# Patient Record
Sex: Female | Born: 1990 | Race: White | Hispanic: No | Smoking: Former smoker
Health system: Southern US, Community
[De-identification: ages and names within clinical notes are randomized; demographics above are authoritative.]

## PROBLEM LIST (undated history)

## (undated) ENCOUNTER — Inpatient Hospital Stay (HOSPITAL_COMMUNITY): Payer: Self-pay

## (undated) DIAGNOSIS — K859 Acute pancreatitis without necrosis or infection, unspecified: Secondary | ICD-10-CM

## (undated) DIAGNOSIS — J45909 Unspecified asthma, uncomplicated: Secondary | ICD-10-CM

## (undated) DIAGNOSIS — Z8619 Personal history of other infectious and parasitic diseases: Secondary | ICD-10-CM

## (undated) DIAGNOSIS — K219 Gastro-esophageal reflux disease without esophagitis: Secondary | ICD-10-CM

## (undated) DIAGNOSIS — K649 Unspecified hemorrhoids: Secondary | ICD-10-CM

## (undated) DIAGNOSIS — N390 Urinary tract infection, site not specified: Secondary | ICD-10-CM

## (undated) DIAGNOSIS — L0292 Furuncle, unspecified: Secondary | ICD-10-CM

## (undated) DIAGNOSIS — L0293 Carbuncle, unspecified: Secondary | ICD-10-CM

## (undated) DIAGNOSIS — N83209 Unspecified ovarian cyst, unspecified side: Secondary | ICD-10-CM

## (undated) HISTORY — PX: TONSILLECTOMY: SUR1361

## (undated) HISTORY — DX: Personal history of other infectious and parasitic diseases: Z86.19

## (undated) HISTORY — PX: BACK SURGERY: SHX140

## (undated) HISTORY — PX: WISDOM TOOTH EXTRACTION: SHX21

---
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
## 2008-02-27 IMAGING — CR DG CHEST 1V PORT
1 series · 1 of 1 positions shown · non-contrast
Comparison: none

REASON FOR EXAM: Short of Breath  rm 15
COMMENTS:   LMP: Two weeks ago

PROCEDURE:     DXR - DXR PORTABLE CHEST SINGLE VIEW  - July 18, 2007  [DATE]
RESULT:     The lungs are mildly hyperinflated. There is no focal
infiltrate. The heart is normal in size and the pulmonary vascularity is not
engorged.

[view not recorded]
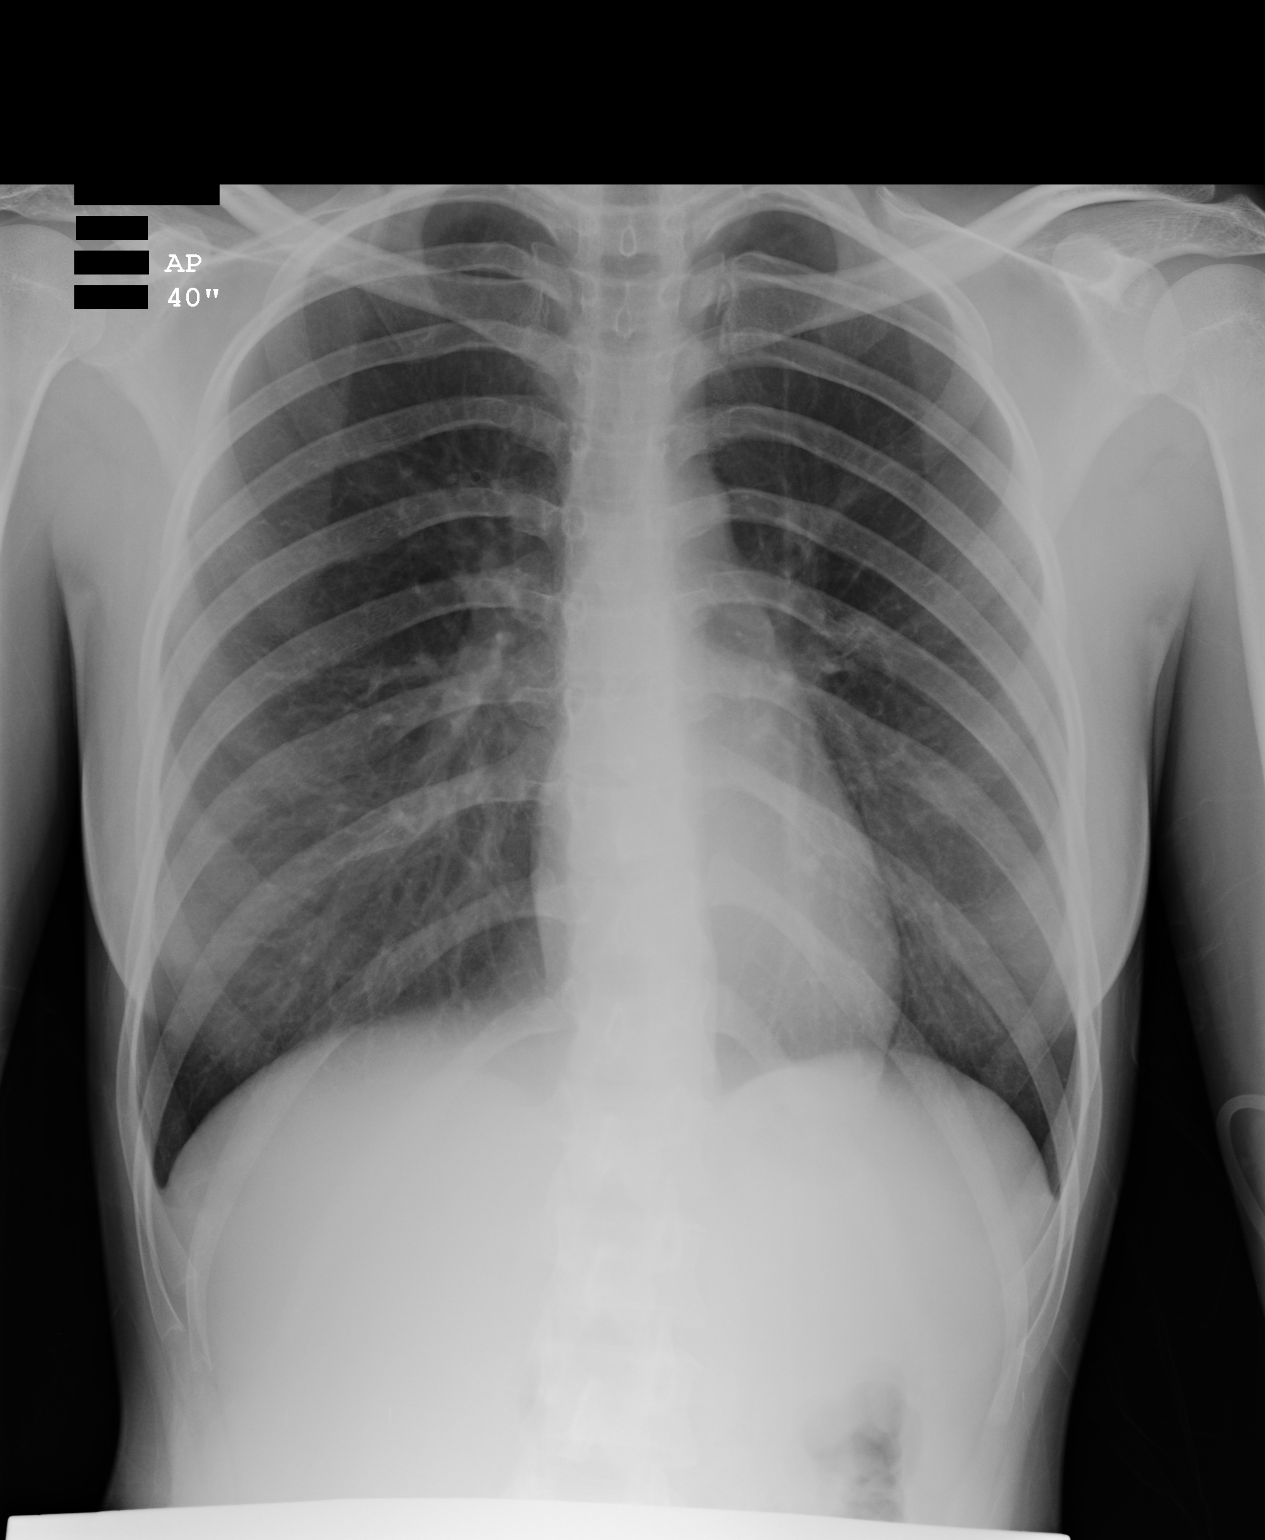

[1 of 1 positions shown; findings below may reference images not displayed]

IMPRESSION: There are findings which likely reflect an element of reactive airway
disease and acute bronchitis. I do not see evidence of pneumonia. Followup
films would be a value.

## 2008-06-03 ENCOUNTER — Emergency Department: Payer: Self-pay | Admitting: Emergency Medicine

## 2009-06-16 ENCOUNTER — Emergency Department: Payer: Self-pay | Admitting: Emergency Medicine

## 2009-10-25 ENCOUNTER — Emergency Department (HOSPITAL_COMMUNITY): Admission: EM | Admit: 2009-10-25 | Discharge: 2009-10-25 | Payer: Self-pay | Admitting: Emergency Medicine

## 2010-01-30 ENCOUNTER — Emergency Department: Payer: Self-pay | Admitting: Emergency Medicine
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
## 2010-10-19 ENCOUNTER — Emergency Department (HOSPITAL_COMMUNITY)
Admission: EM | Admit: 2010-10-19 | Discharge: 2010-10-19 | Disposition: A | Discharge status: Home / Self Care | Payer: Self-pay | Attending: Emergency Medicine | Admitting: Emergency Medicine

## 2010-10-19 DIAGNOSIS — R51 Headache: Secondary | ICD-10-CM | POA: Insufficient documentation

## 2010-10-19 DIAGNOSIS — J45909 Unspecified asthma, uncomplicated: Secondary | ICD-10-CM | POA: Insufficient documentation

## 2010-11-13 LAB — BASIC METABOLIC PANEL
BUN: 12 mg/dL (ref 6–23)
Calcium: 8.8 mg/dL (ref 8.4–10.5)
Creatinine, Ser: 0.64 mg/dL (ref 0.4–1.2)
GFR calc non Af Amer: >60 mL/min (ref >60)
Glucose, Bld: 88 mg/dL (ref 70–99)

## 2010-11-13 LAB — DIFFERENTIAL
Basophils Absolute: 0 10*3/uL (ref 0.0–0.1)
Eosinophils Relative: 1 % (ref 0–5)
Lymphocytes Relative: 17 % (ref 12–46)
Neutro Abs: 7.1 10*3/uL (ref 1.7–7.7)
Neutrophils Relative %: 77 % (ref 43–77)

## 2010-11-13 LAB — CBC
Platelets: 171 10*3/uL (ref 150–400)
RDW: 13.5 % (ref 11.5–15.5)

## 2010-11-13 LAB — POCT I-STAT, CHEM 8
BUN: 13 mg/dL (ref 6–23)
Chloride: 110 mEq/L (ref 96–112)
Potassium: 4.2 mEq/L (ref 3.5–5.1)
Sodium: 139 mEq/L (ref 135–145)
TCO2: 19 mmol/L (ref 0–100)

## 2010-11-13 LAB — POCT PREGNANCY, URINE: Preg Test, Ur: NEGATIVE

## 2011-01-03 NOTE — Op Note (Signed)
Maria [name], Maria [name]                ACCOUNT NO.:  [id number]   MEDICAL RECORD NO.:  [id number]          PATIENT TYPE:  AMB   LOCATION:  [facility]                          FACILITY:  [facility]   PHYSICIAN:  Lucky Cowboy, MD         DATE OF BIRTH:  Jan 31, 1991   DATE OF PROCEDURE:  08/20/2007  DATE OF DISCHARGE:  08/21/2007                               OPERATIVE REPORT   PREOPERATIVE DIAGNOSIS:  Chronic tonsillitis with adenotonsillar  hypertrophy.   POSTOPERATIVE DIAGNOSIS:  Chronic tonsillitis with adenotonsillar  hypertrophy.   PROCEDURE:  Adenotonsillectomy.   SURGEON:  Dr. Lucky Cowboy.   ANESTHESIA:  General endotracheal anesthesia.   ESTIMATED BLOOD LOSS:  Less than 20 mL.   SPECIMENS:  Tonsils and adenoids.   COMPLICATIONS:  None.   INDICATIONS:  The patient is an 20 year old female who has had problems  with chronic sore throat recurrent infections.  For these reasons,  adenotonsillectomy is performed.   FINDINGS:  The patient was noted to have scarred bilateral palatine  tonsils and a moderate amount of adenoid hypertrophy.   PROCEDURE:  The patient was taken to the operating room and placed on  the table in the supine position.  She was then placed under general  endotracheal anesthesia and the table rotated counterclockwise 90  degrees.  The head and body were draped.  A Crowe-Davis mouth gag with a  #3 tongue blade was then placed intraorally, opened and suspended on the  Mayo stand.  Palpation of the soft palate was without evidence of  submucosal cleft.  A red rubber catheter was placed down the left  nostril, brought out through the oral cavity and secured in place with a  hemostat.  A large adenoid curette was placed against vomer, directed  inferiorly with subsequent passes severing the adenoid pad.  Two sterile  gauze Afrin soaked packs were placed in the nasopharynx and time allowed  for hemostasis.  The palate was then relaxed and the right palatine  tonsil grasped  with Allis clamps and directed inferior medially.  Bovie  cautery was used to excise the tonsil staying within the peritonsillar  space adjacent to the tonsillar capsule.  The left palatine tonsil was  removed in identical fashion.  The palate was then re-elevated and packs  removed.  Suction cautery used for hemostasis.  Nasopharynx was  copiously irrigated transnasally with normal saline which was suctioned  out through the oral cavity.  An NG tube was then placed down the  esophagus for suctioning of the gastric  contents.  The mouth gag was removed noting no damage to the teeth or  soft tissues.  The table was rotated clockwise 90 degrees to its  original position.  The patient was awakened from anesthesia and taken  to the Post Anesthesia Care in stable condition.  There were no  complications.      Lucky Cowboy, MD  Electronically Signed     SJ/MEDQ  D:  10/02/2007  T:  10/04/2007  Job:  [id number]

## 2011-04-14 ENCOUNTER — Emergency Department: Payer: Self-pay | Admitting: *Deleted

## 2011-05-26 LAB — POCT HEMOGLOBIN-HEMACUE: Operator id: 128471

## 2011-11-24 IMAGING — CR LEFT GREAT TOE
1 series · 3 of 3 positions shown · non-contrast
Comparison: none

REASON FOR EXAM: trauma, swelling pain
COMMENTS:

[Series 1: view not recorded · 0.17mm/px · 3 of 3 slices shown]
[im 1/3]
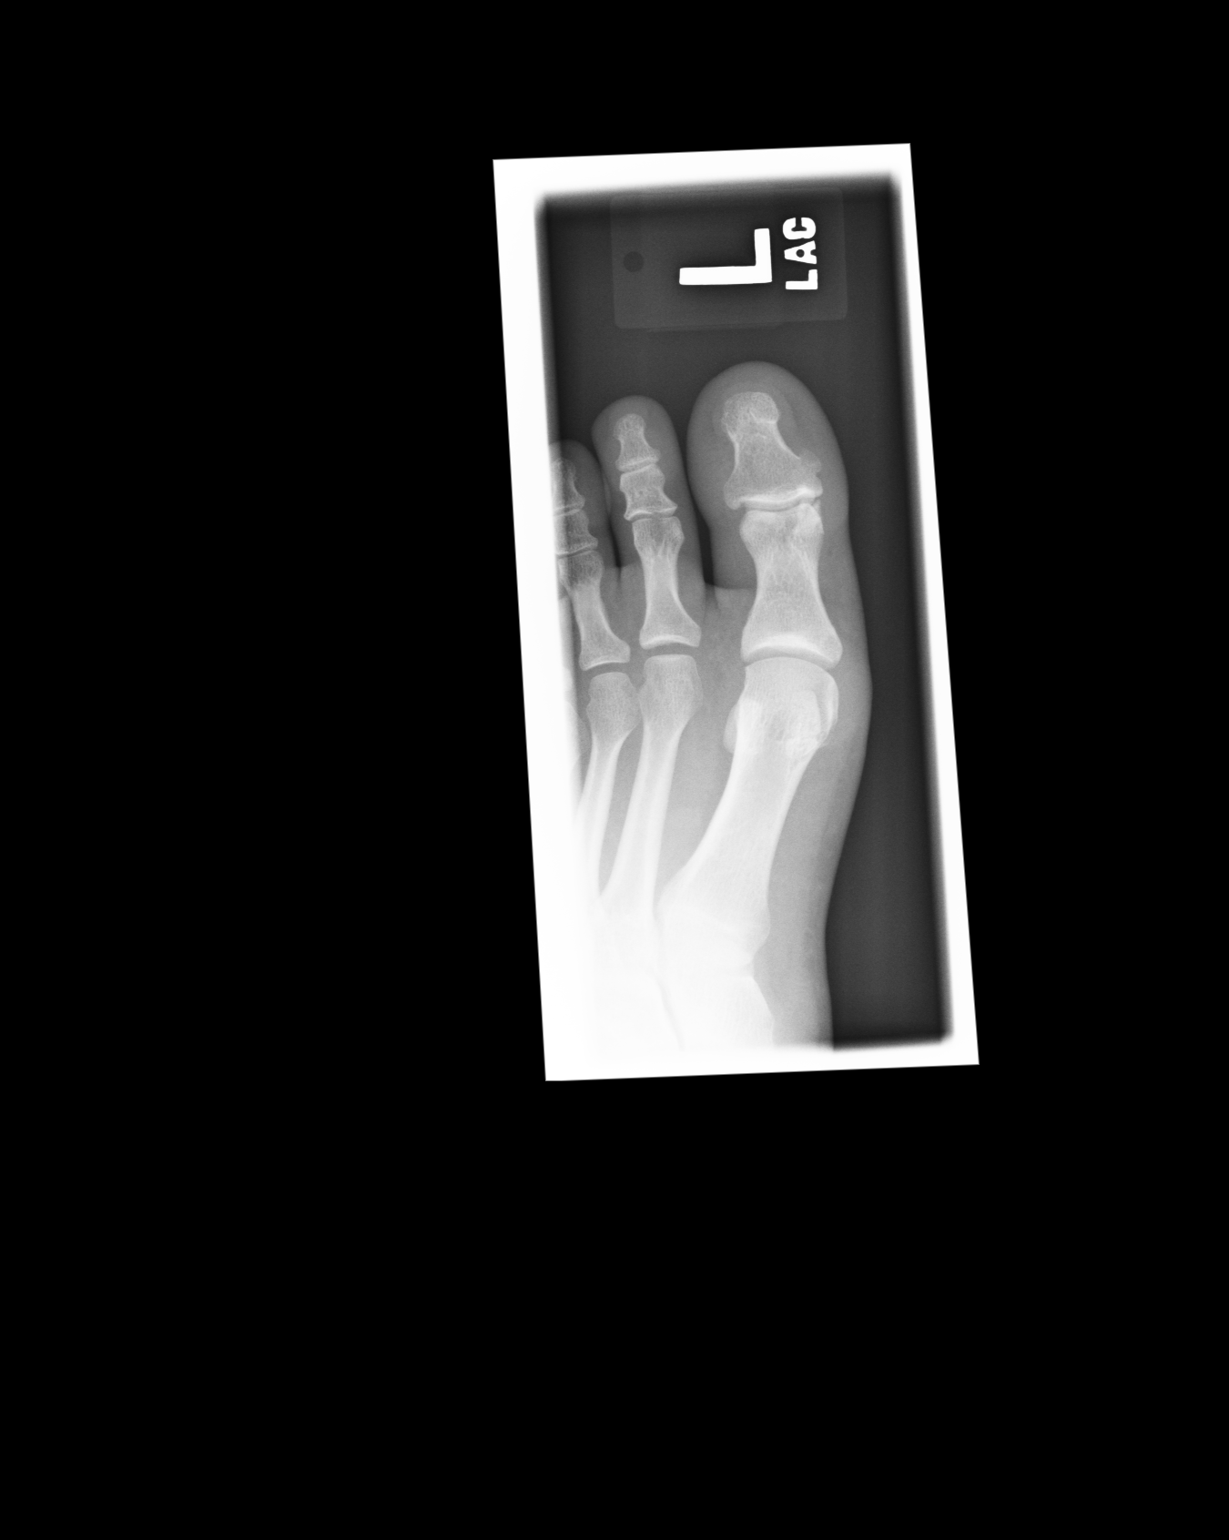
[im 2/3]
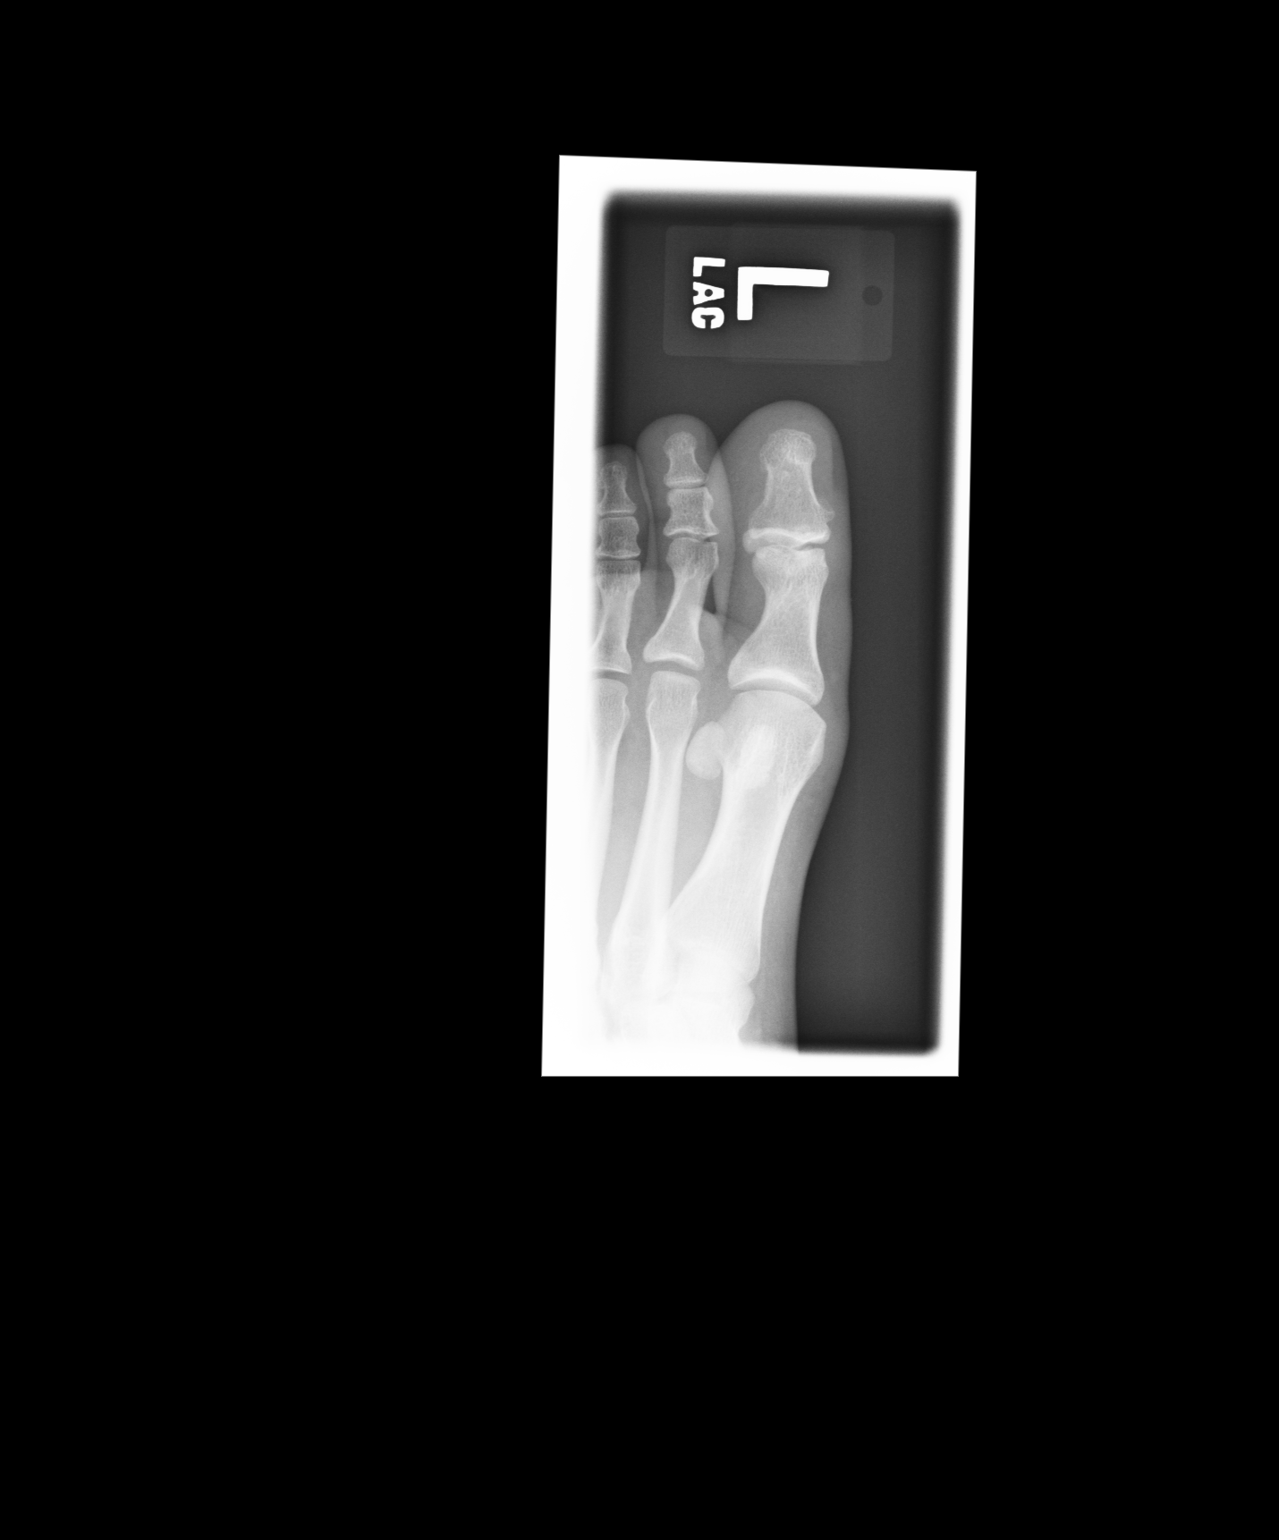
[im 3/3]
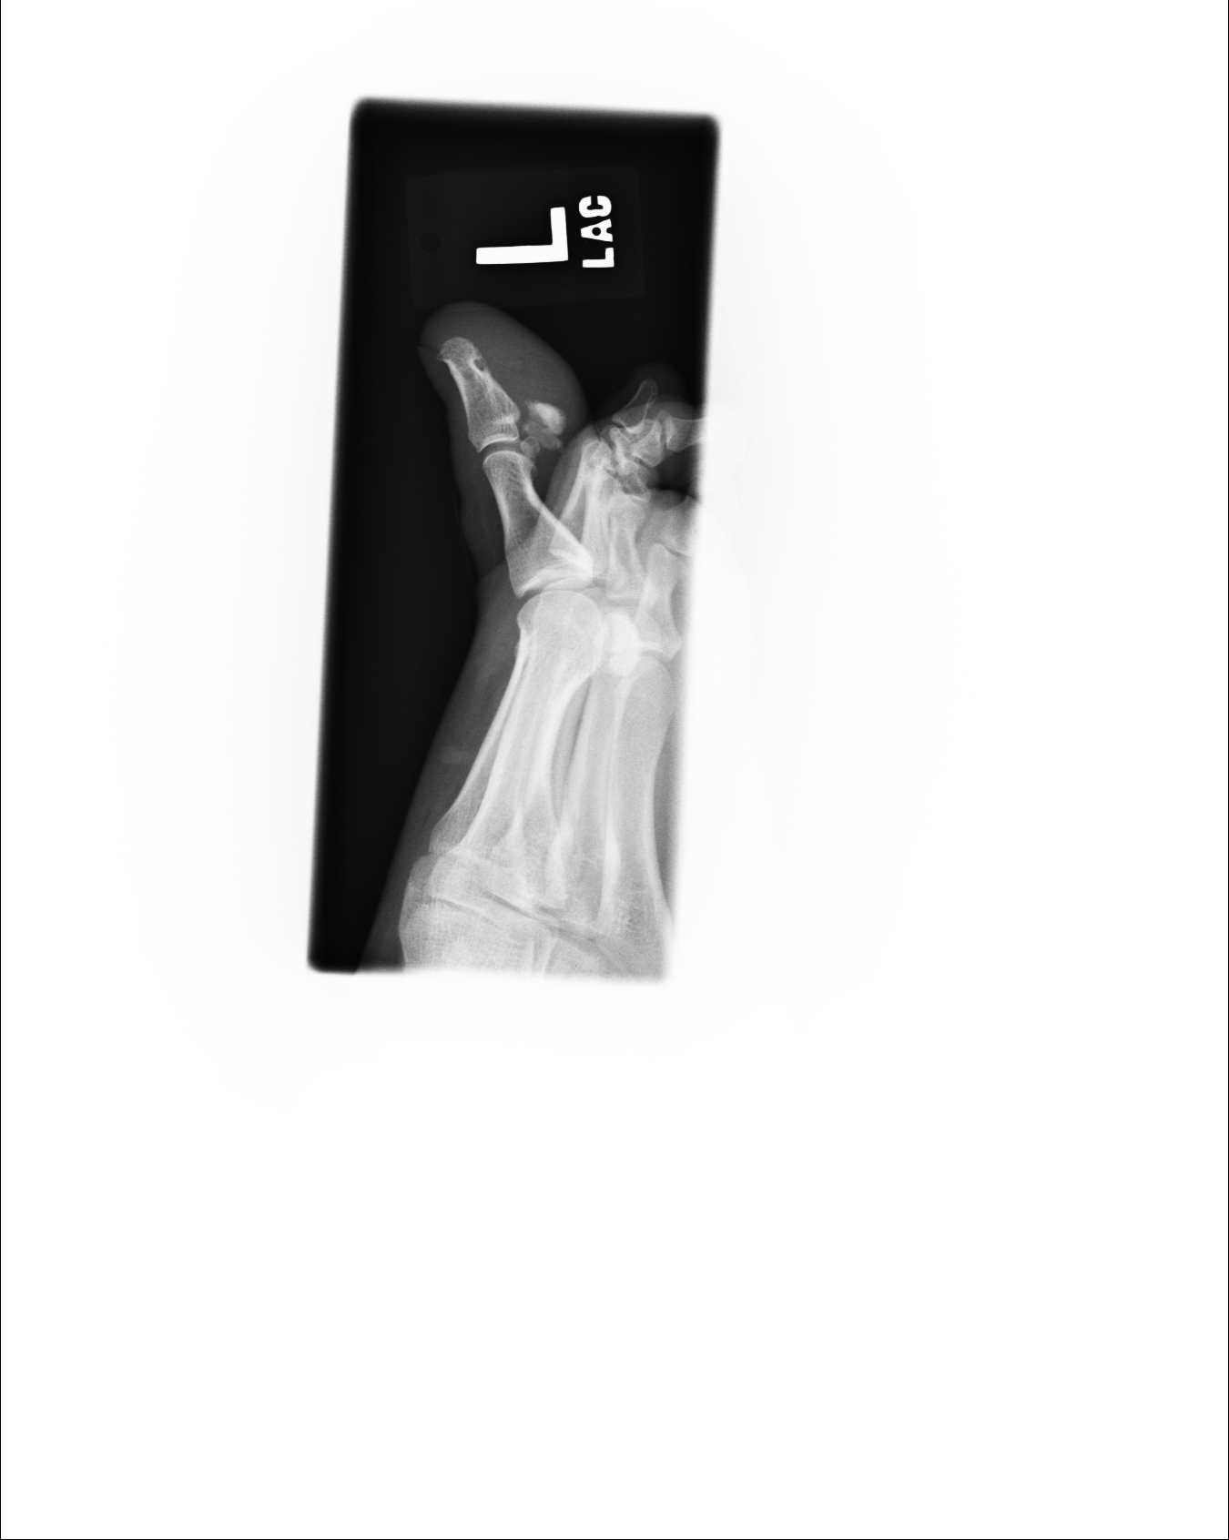

[3 of 3 positions shown; findings below may reference images not displayed]

PROCEDURE:     DXR - DXR TOE GREAT (1ST DIGIT) LT SPILIAUSKAS  - April 14, 2011  [DATE]

RESULT:     No acute fracture or dislocation is seen. There are multiple
osseous densities volar to the IP joint compatible with sesamoid bones.
There is an apparent old fracture of one of the sesamoid bones. No
periosteal reactive changes are seen. No arthritic changes are identified.
IMPRESSION: 1.  No acute bony abnormalities are identified.
2.  Possible old fracture of the more medial sesamoid bone at the IP joint.

## 2012-09-08 ENCOUNTER — Encounter (HOSPITAL_BASED_OUTPATIENT_CLINIC_OR_DEPARTMENT_OTHER): Payer: Self-pay | Admitting: *Deleted

## 2012-09-08 ENCOUNTER — Emergency Department (HOSPITAL_BASED_OUTPATIENT_CLINIC_OR_DEPARTMENT_OTHER)
Admission: EM | Admit: 2012-09-08 | Discharge: 2012-09-08 | Disposition: A | Discharge status: Home / Self Care | Payer: Self-pay | Attending: Emergency Medicine | Admitting: Emergency Medicine

## 2012-09-08 DIAGNOSIS — Z3202 Encounter for pregnancy test, result negative: Secondary | ICD-10-CM | POA: Insufficient documentation

## 2012-09-08 DIAGNOSIS — N390 Urinary tract infection, site not specified: Secondary | ICD-10-CM | POA: Insufficient documentation

## 2012-09-08 DIAGNOSIS — J45909 Unspecified asthma, uncomplicated: Secondary | ICD-10-CM | POA: Insufficient documentation

## 2012-09-08 DIAGNOSIS — F17200 Nicotine dependence, unspecified, uncomplicated: Secondary | ICD-10-CM | POA: Insufficient documentation

## 2012-09-08 HISTORY — DX: Unspecified asthma, uncomplicated: J45.909

## 2012-09-08 LAB — URINALYSIS, ROUTINE W REFLEX MICROSCOPIC
Glucose, UA: NEGATIVE mg/dL
Ketones, ur: NEGATIVE mg/dL
Protein, ur: NEGATIVE mg/dL

## 2012-09-08 LAB — URINE MICROSCOPIC-ADD ON

## 2012-09-08 MED ORDER — SULFAMETHOXAZOLE-TRIMETHOPRIM 800-160 MG PO TABS: 1.0000 | ORAL_TABLET | Freq: Two times a day (BID) | ORAL | Status: AC

## 2012-09-08 NOTE — ED Provider Notes (Signed)
History     CSN: [id number]  Arrival date & time 09/08/12  4098   First MD Initiated Contact with Patient 09/08/12 1939      Chief Complaint  Patient presents with  . Abdominal Pain  . Dysuria    (Consider location/radiation/quality/duration/timing/severity/associated sxs/prior treatment) HPI Comments: Patient with history of UTIs presents with 3 day history of dysuria.  She reports urinary urgency and frequency. She denies hematuria.  No fevers or flank pain.  She reports suprapubic abdominal pain. She has tried taking OTC Cystex and has been drinking cranberry juice.  Her LMP was about 3 weeks ago and normal in duration.  She normally has a period every other week but is not bleeding at this time.  She denies N/V/D.  She has taken 3 pregnancy tests, all of which were negative. Onset was gradual with no relief of symptoms.   Patient is a 22 y.o. female presenting with abdominal pain and dysuria. The history is provided by the patient.  Abdominal Pain The primary symptoms of the illness include abdominal pain and dysuria. The primary symptoms of the illness do not include fever, fatigue, shortness of breath, nausea, vomiting, diarrhea, vaginal discharge or vaginal bleeding.  The dysuria is associated with urgency. The dysuria is not associated with hematuria.  Additional symptoms associated with the illness include urgency. Symptoms associated with the illness do not include chills, constipation or hematuria.  Dysuria  Associated symptoms include urgency. Pertinent negatives include no chills, no nausea, no vomiting, no hematuria and no flank pain.    Past Medical History  Diagnosis Date  . Asthma     Past Surgical History  Procedure Date  . Tonsillectomy   . Wisdom tooth extraction     No family history on file.  History  Substance Use Topics  . Smoking status: Current Some Day Smoker    Types: Cigarettes  . Smokeless tobacco: Never Used  . Alcohol Use: 0.6 oz/week    1  Cans of beer per week    OB History    Grav Para Term Preterm Abortions TAB SAB Ect Mult Living                  Review of Systems  Constitutional: Negative for fever, chills, activity change, appetite change and fatigue.  HENT: Negative for sore throat and rhinorrhea.   Eyes: Negative for redness.  Respiratory: Negative for cough and shortness of breath.   Cardiovascular: Negative for chest pain.  Gastrointestinal: Positive for abdominal pain. Negative for nausea, vomiting, diarrhea and constipation.  Genitourinary: Positive for dysuria, urgency, difficulty urinating and menstrual problem (irregular bleeding). Negative for hematuria, flank pain, vaginal bleeding, vaginal discharge and pelvic pain.  Musculoskeletal: Negative for myalgias.  Skin: Negative for rash.  Neurological: Negative for dizziness, light-headedness and headaches.    Allergies  Penicillins  Home Medications   Current Outpatient Rx  Name  Route  Sig  Dispense  Refill  . ALBUTEROL SULFATE HFA 108 (90 BASE) MCG/ACT IN AERS   Inhalation   Inhale 2 puffs into the lungs every 6 (six) hours as needed.           BP 131/83  Pulse 73  Temp 98.3 F (36.8 C) (Oral)  Resp 18  Ht 5\' 2"  (1.575 m)  Wt 116 lb (52.617 kg)  BMI 21.22 kg/m2  SpO2 98%  LMP 08/21/2012  Physical Exam  Nursing note and vitals reviewed. Constitutional: She appears well-developed and well-nourished.  HENT:  Head: Normocephalic  and atraumatic.  Nose: No mucosal edema or rhinorrhea.       Some crusting in nares, no bleeding.   Eyes: Conjunctivae normal are normal. Right eye exhibits no discharge. Left eye exhibits no discharge.  Neck: Normal range of motion. Neck supple.  Cardiovascular: Normal rate, regular rhythm and normal heart sounds.   Pulmonary/Chest: Effort normal and breath sounds normal.  Abdominal: Soft. Bowel sounds are normal. She exhibits no distension and no mass. There is tenderness in the suprapubic area. There is  no rebound.         Suprapubic tenderness without rebound tenderness.   Neurological: She is alert.  Skin: Skin is warm and dry.  Psychiatric: She has a normal mood and affect.    ED Course  Procedures (including critical care time)  Labs Reviewed  URINALYSIS, ROUTINE W REFLEX MICROSCOPIC - Abnormal; Notable for the following:    APPearance CLOUDY (*)     Specific Gravity, Urine 1.031 (*)     Leukocytes, UA TRACE (*)     All other components within normal limits  URINE MICROSCOPIC-ADD ON - Abnormal; Notable for the following:    Squamous Epithelial / LPF MANY (*)     Bacteria, UA FEW (*)     All other components within normal limits  PREGNANCY, URINE  URINE CULTURE   No results found.   1. Urinary tract infection     Patient seen and examined.   Vital signs reviewed and are as follows: Filed Vitals:   09/08/12 1857  BP: 131/83  Pulse: 73  Temp: 98.3 F (36.8 C)  Resp: 18   The patient was urged to return to the Emergency Department immediately with worsening of current symptoms, worsening abdominal pain, persistent vomiting, blood noted in stools, fever, or any other concerns. The patient verbalized understanding.   Urged GYN f/u for evaluation of irregular periods.   Urged ENT/PCP f/u for evaluation of crusting of nose. Told to use oil based ointment to moisturize.     MDM  Suprapubic pain/dysuria: UA suggest UTI. Culture sent. Bactrim.  Regular uterine bleeding: No current bleeding. Patient follow up with GYN. Do not suspect significant anemia. Urine pregnancy is negative.        Renne Crigler, [place] 09/08/12 2050

## 2012-09-08 NOTE — ED Notes (Signed)
Pt reports abd pain and bloating for a while- had depo 1.5 years ago and period has been irregular- has had 3 neg home preg tests- c/o dysuria for several days- also breast pain. Pt reports she also has sores inside nose-

## 2012-09-09 LAB — URINE CULTURE: Colony Count: 6000

## 2012-09-09 NOTE — ED Provider Notes (Signed)
Medical screening examination/treatment/procedure(s) were performed by non-physician practitioner and as supervising physician I was immediately available for consultation/collaboration.  Trinton Prewitt, MD 09/09/12 0944 

## 2012-11-19 ENCOUNTER — Encounter (HOSPITAL_BASED_OUTPATIENT_CLINIC_OR_DEPARTMENT_OTHER): Payer: Self-pay | Admitting: *Deleted

## 2012-11-19 ENCOUNTER — Emergency Department (HOSPITAL_BASED_OUTPATIENT_CLINIC_OR_DEPARTMENT_OTHER)
Admission: EM | Admit: 2012-11-19 | Discharge: 2012-11-20 | Disposition: A | Discharge status: Home / Self Care | Payer: Self-pay | Attending: Emergency Medicine | Admitting: Emergency Medicine

## 2012-11-19 DIAGNOSIS — N760 Acute vaginitis: Secondary | ICD-10-CM | POA: Insufficient documentation

## 2012-11-19 DIAGNOSIS — F17200 Nicotine dependence, unspecified, uncomplicated: Secondary | ICD-10-CM | POA: Insufficient documentation

## 2012-11-19 DIAGNOSIS — R109 Unspecified abdominal pain: Secondary | ICD-10-CM | POA: Insufficient documentation

## 2012-11-19 DIAGNOSIS — R300 Dysuria: Secondary | ICD-10-CM | POA: Insufficient documentation

## 2012-11-19 DIAGNOSIS — Z3202 Encounter for pregnancy test, result negative: Secondary | ICD-10-CM | POA: Insufficient documentation

## 2012-11-19 DIAGNOSIS — Z79899 Other long term (current) drug therapy: Secondary | ICD-10-CM | POA: Insufficient documentation

## 2012-11-19 DIAGNOSIS — J45909 Unspecified asthma, uncomplicated: Secondary | ICD-10-CM | POA: Insufficient documentation

## 2012-11-19 LAB — URINALYSIS, ROUTINE W REFLEX MICROSCOPIC
Bilirubin Urine: NEGATIVE
Glucose, UA: NEGATIVE mg/dL
Hgb urine dipstick: NEGATIVE
Ketones, ur: NEGATIVE mg/dL
Protein, ur: NEGATIVE mg/dL

## 2012-11-19 LAB — URINE MICROSCOPIC-ADD ON

## 2012-11-19 NOTE — ED Notes (Addendum)
Vaginal itching x 2 days. Was seen here about a month ago and told she had a UTI but she never got the Bactrim Rx filled until 2 days ago. Now her feet are itching. Dysuria.

## 2012-11-20 LAB — WET PREP, GENITAL
Trich, Wet Prep: NONE SEEN
Yeast Wet Prep HPF POC: NONE SEEN

## 2012-11-20 LAB — GC/CHLAMYDIA PROBE AMP
CT Probe RNA: NEGATIVE
GC Probe RNA: NEGATIVE

## 2012-11-20 MED ORDER — METRONIDAZOLE 500 MG PO TABS
2000.0000 mg | ORAL_TABLET | Freq: Once | ORAL | Status: AC
  Administered 2012-11-20: 2000 mg via ORAL
  Filled 2012-11-20: qty 4

## 2012-11-20 NOTE — ED Provider Notes (Signed)
History     CSN: [id number]  Arrival date & time 11/19/12  2215   First MD Initiated Contact with Patient 11/19/12 2356      Chief Complaint  Patient presents with  . Vaginal Pain    (Consider location/radiation/quality/duration/timing/severity/associated sxs/prior treatment) HPI Patient is g0 lmp 2-3 weeks ago, normal period at normal time, began having suprapubic burning and dysuria yesterday and started bactrim.  No fever, some vaginal discharge since beginning bactrim- has taken four doses.  Denies change in uti symptoms.  Patient is taking po well.  Patient has pmd at  [facility] in [place].  Bactrim obtained last visit and didn't take because symptoms resolved without it.  History of utis with similar symptoms but burning worse now.   Past Medical History  Diagnosis Date  . Asthma     Past Surgical History  Procedure Laterality Date  . Tonsillectomy    . Wisdom tooth extraction      No family history on file.  History  Substance Use Topics  . Smoking status: Current Some Day Smoker    Types: Cigarettes  . Smokeless tobacco: Never Used  . Alcohol Use: 0.6 oz/week    1 Cans of beer per week    OB History   Grav Para Term Preterm Abortions TAB SAB Ect Mult Living                  Review of Systems  All other systems reviewed and are negative.    Allergies  Penicillins  Home Medications   Current Outpatient Rx  Name  Route  Sig  Dispense  Refill  . albuterol (PROVENTIL HFA;VENTOLIN HFA) 108 (90 BASE) MCG/ACT inhaler   Inhalation   Inhale 2 puffs into the lungs every 6 (six) hours as needed.           BP 131/86  Pulse 108  Temp(Src) 98.3 F (36.8 C) (Oral)  Resp 20  Wt 116 lb (52.617 kg)  BMI 21.21 kg/m2  SpO2 98%  LMP 10/19/2012  Physical Exam  Vitals reviewed. Constitutional: She appears well-developed and well-nourished.  HENT:  Head: Normocephalic and atraumatic.  Right Ear: External ear normal.  Left Ear: External ear normal.  Eyes:  Conjunctivae and EOM are normal. Pupils are equal, round, and reactive to light.  Neck: Normal range of motion. Neck supple.  Cardiovascular: Normal rate and regular rhythm.   Pulmonary/Chest: Effort normal and breath sounds normal.  Abdominal: Soft. Bowel sounds are normal.  Genitourinary: No erythema, tenderness or bleeding around the vagina. No vaginal discharge found.    ED Course  Procedures (including critical care time)  Labs Reviewed  URINALYSIS, ROUTINE W REFLEX MICROSCOPIC - Abnormal; Notable for the following:    APPearance CLOUDY (*)    Leukocytes, UA TRACE (*)    All other components within normal limits  URINE MICROSCOPIC-ADD ON - Abnormal; Notable for the following:    Squamous Epithelial / LPF FEW (*)    All other components within normal limits  PREGNANCY, URINE   No results found.   No diagnosis found.    MDM  Patient with possible UTI has has been partially treated with Bactrim she is instructed to continue this. Wet prep is consistent for bacterial vaginosis and patient given metronidazole 2 g here in the emergency department.       Hilario Quarry, MD 11/20/12 [phone]

## 2013-06-05 ENCOUNTER — Encounter (HOSPITAL_BASED_OUTPATIENT_CLINIC_OR_DEPARTMENT_OTHER): Payer: Self-pay | Admitting: Emergency Medicine

## 2013-06-05 ENCOUNTER — Emergency Department (HOSPITAL_BASED_OUTPATIENT_CLINIC_OR_DEPARTMENT_OTHER): Payer: Self-pay

## 2013-06-05 ENCOUNTER — Emergency Department (HOSPITAL_BASED_OUTPATIENT_CLINIC_OR_DEPARTMENT_OTHER)
Admission: EM | Admit: 2013-06-05 | Discharge: 2013-06-05 | Disposition: A | Discharge status: Home / Self Care | Payer: Self-pay | Attending: Emergency Medicine | Admitting: Emergency Medicine

## 2013-06-05 DIAGNOSIS — Z880 Allergy status to penicillin: Secondary | ICD-10-CM | POA: Insufficient documentation

## 2013-06-05 DIAGNOSIS — Z3202 Encounter for pregnancy test, result negative: Secondary | ICD-10-CM | POA: Insufficient documentation

## 2013-06-05 DIAGNOSIS — R110 Nausea: Secondary | ICD-10-CM | POA: Insufficient documentation

## 2013-06-05 DIAGNOSIS — F17200 Nicotine dependence, unspecified, uncomplicated: Secondary | ICD-10-CM | POA: Insufficient documentation

## 2013-06-05 DIAGNOSIS — K859 Acute pancreatitis without necrosis or infection, unspecified: Secondary | ICD-10-CM | POA: Insufficient documentation

## 2013-06-05 DIAGNOSIS — J45909 Unspecified asthma, uncomplicated: Secondary | ICD-10-CM | POA: Insufficient documentation

## 2013-06-05 DIAGNOSIS — Z79899 Other long term (current) drug therapy: Secondary | ICD-10-CM | POA: Insufficient documentation

## 2013-06-05 DIAGNOSIS — R1011 Right upper quadrant pain: Secondary | ICD-10-CM | POA: Insufficient documentation

## 2013-06-05 LAB — PREGNANCY, URINE: Preg Test, Ur: NEGATIVE

## 2013-06-05 LAB — CBC WITH DIFFERENTIAL/PLATELET
Basophils Relative: 0 % (ref 0–1)
HCT: 42.6 % (ref 36.0–46.0)
Hemoglobin: 14.4 g/dL (ref 12.0–15.0)
Lymphs Abs: 2.4 10*3/uL (ref 0.7–4.0)
MCH: 32 pg (ref 26.0–34.0)
MCHC: 33.8 g/dL (ref 30.0–36.0)
Monocytes Absolute: 0.5 10*3/uL (ref 0.1–1.0)
Monocytes Relative: 8 % (ref 3–12)
Neutro Abs: 2.8 10*3/uL (ref 1.7–7.7)

## 2013-06-05 LAB — COMPREHENSIVE METABOLIC PANEL
Alkaline Phosphatase: 60 U/L (ref 39–117)
BUN: 15 mg/dL (ref 6–23)
CO2: 28 mEq/L (ref 19–32)
GFR calc Af Amer: >90 mL/min (ref >90)
GFR calc non Af Amer: >90 mL/min (ref >90)
Glucose, Bld: 84 mg/dL (ref 70–99)
Potassium: 4.3 mEq/L (ref 3.5–5.1)
Total Bilirubin: 0.2 mg/dL — ABNORMAL LOW (ref 0.3–1.2)
Total Protein: 7.6 g/dL (ref 6.0–8.3)

## 2013-06-05 LAB — URINALYSIS, ROUTINE W REFLEX MICROSCOPIC
Glucose, UA: NEGATIVE mg/dL
Hgb urine dipstick: NEGATIVE
Ketones, ur: NEGATIVE mg/dL
Protein, ur: NEGATIVE mg/dL

## 2013-06-05 MED ORDER — ONDANSETRON HCL 4 MG PO TABS: 4.0000 mg | ORAL_TABLET | Freq: Four times a day (QID) | ORAL | Status: DC

## 2013-06-05 MED ORDER — HYDROMORPHONE HCL PF 1 MG/ML IJ SOLN
1.0000 mg | Freq: Once | INTRAMUSCULAR | Status: DC
  Filled 2013-06-05: qty 1

## 2013-06-05 MED ORDER — SODIUM CHLORIDE 0.9 % IV BOLUS (SEPSIS): 1000.0000 mL | Freq: Once | INTRAVENOUS | Status: DC

## 2013-06-05 MED ORDER — MORPHINE SULFATE 4 MG/ML IJ SOLN
4.0000 mg | Freq: Once | INTRAMUSCULAR | Status: AC
  Administered 2013-06-05: 4 mg via INTRAVENOUS
  Filled 2013-06-05: qty 1

## 2013-06-05 MED ORDER — SODIUM CHLORIDE 0.9 % IV BOLUS (SEPSIS)
1000.0000 mL | Freq: Once | INTRAVENOUS | Status: AC
  Administered 2013-06-05: 1000 mL via INTRAVENOUS

## 2013-06-05 MED ORDER — ONDANSETRON HCL 4 MG/2ML IJ SOLN
4.0000 mg | INTRAMUSCULAR | Status: AC
  Administered 2013-06-05: 4 mg via INTRAVENOUS
  Filled 2013-06-05: qty 2

## 2013-06-05 MED ORDER — OXYCODONE-ACETAMINOPHEN 5-325 MG PO TABS: 1.0000 | ORAL_TABLET | Freq: Four times a day (QID) | ORAL | Status: DC | PRN

## 2013-06-05 NOTE — ED Notes (Signed)
Patient states she has had abdominal pain for the last two days.  States she thinks may be pregnant.  States the pain is radiating into her back and her chest.

## 2013-06-05 NOTE — ED Provider Notes (Signed)
CSN: [id number]     Arrival date & time 06/05/13  1429 History   First MD Initiated Contact with Patient 06/05/13 1439     Chief Complaint  Patient presents with  . Abdominal Pain   (Consider location/radiation/quality/duration/timing/severity/associated sxs/prior Treatment) HPI Comments: Patient is a 22 year old female with no significant past medical history who presents for abdominal pain x2 days. Patient states the pain is aching, throbbing, and sharp and radiates to her right upper quadrant and right back as well as up to her chest. Patient denies any modifying factors of her pain and has not taken anything for pain control prior to arrival. She admits to associated nausea and denies associated fever, shortness of breath, vomiting or diarrhea, melena or hematochezia, urinary symptoms, vaginal bleeding or discharge, and numbness or tingling. Patient denies a history of abdominal surgeries. She states her last bowel movement was yesterday which was normal in color and consistency.  Patient is a 22 y.o. female presenting with abdominal pain. The history is provided by the patient. No language interpreter was used.  Abdominal Pain Associated symptoms: nausea   Associated symptoms: no chest pain, no diarrhea, no dysuria, no fever, no hematuria, no shortness of breath, no vaginal bleeding, no vaginal discharge and no vomiting     Past Medical History  Diagnosis Date  . Asthma    Past Surgical History  Procedure Laterality Date  . Tonsillectomy    . Wisdom tooth extraction     No family history on file. History  Substance Use Topics  . Smoking status: Current Some Day Smoker    Types: Cigarettes  . Smokeless tobacco: Never Used  . Alcohol Use: 0.6 oz/week    1 Cans of beer per week   OB History   Grav Para Term Preterm Abortions TAB SAB Ect Mult Living                 Review of Systems  Constitutional: Negative for fever.  Respiratory: Negative for shortness of breath.     Cardiovascular: Negative for chest pain.  Gastrointestinal: Positive for nausea and abdominal pain. Negative for vomiting, diarrhea and blood in stool.  Genitourinary: Negative for dysuria, hematuria, vaginal bleeding and vaginal discharge.  All other systems reviewed and are negative.    Allergies  Penicillins  Home Medications   Current Outpatient Rx  Name  Route  Sig  Dispense  Refill  . albuterol (PROVENTIL HFA;VENTOLIN HFA) 108 (90 BASE) MCG/ACT inhaler   Inhalation   Inhale 2 puffs into the lungs every 6 (six) hours as needed.         . ondansetron (ZOFRAN) 4 MG tablet   Oral   Take 1 tablet (4 mg total) by mouth every 6 (six) hours.   12 tablet   0   . oxyCODONE-acetaminophen (PERCOCET/ROXICET) 5-325 MG per tablet   Oral   Take 1-2 tablets by mouth every 6 (six) hours as needed for pain.   27 tablet   0    BP 129/84  Temp(Src) 98.7 F (37.1 C) (Oral)  Resp 20  Ht 5\' 2"  (1.575 m)  Wt 122 lb (55.339 kg)  BMI 22.31 kg/m2  SpO2 100%  LMP 05/20/2013  Physical Exam  Nursing note and vitals reviewed. Constitutional: She is oriented to person, place, and time. She appears well-developed and well-nourished. No distress.  HENT:  Head: Normocephalic and atraumatic.  Eyes: Conjunctivae and EOM are normal. Pupils are equal, round, and reactive to light. No scleral icterus.  Neck: Normal range of motion.  Cardiovascular: Normal rate, regular rhythm, normal heart sounds and intact distal pulses.   Pulmonary/Chest: Effort normal. No respiratory distress. She has no wheezes. She has no rales.  Abdominal: Soft. She exhibits no mass. There is tenderness. There is no rebound and no guarding.    Tenderness to palpation in the epigastric and right upper quadrant. No peritoneal signs or guarding appreciated.  Musculoskeletal: Normal range of motion.  Neurological: She is alert and oriented to person, place, and time.  Skin: Skin is warm and dry. No rash noted. She is not  diaphoretic. No erythema. No pallor.  Psychiatric: She has a normal mood and affect. Her behavior is normal.    ED Course  Procedures (including critical care time) Labs Review Labs Reviewed  LIPASE, BLOOD - Abnormal; Notable for the following:    Lipase 241 (*)    All other components within normal limits  COMPREHENSIVE METABOLIC PANEL - Abnormal; Notable for the following:    Total Bilirubin 0.2 (*)    All other components within normal limits  URINALYSIS, ROUTINE W REFLEX MICROSCOPIC  PREGNANCY, URINE  CBC WITH DIFFERENTIAL   Imaging Review US Abdomen Complete  06/05/2013   CLINICAL DATA:  Abdominal pain  EXAM: ULTRASOUND ABDOMEN COMPLETE  COMPARISON:  None.  FINDINGS: Gallbladder  No gallstones or wall thickening visualized. No sonographic Murphy sign noted.  Common bile duct  Diameter: 2 mm  Liver  No focal lesion identified. Within normal limits in parenchymal echogenicity.  IVC  No abnormality visualized.  Pancreas  Visualized portion unremarkable.  Spleen  Size and appearance within normal limits.  Right Kidney  Length: 10.7 cm. Echogenicity within normal limits. No mass or hydronephrosis visualized.  Left Kidney  Length: 9.7 cm. Echogenicity within normal limits. No mass or hydronephrosis visualized.  Abdominal aorta  No aneurysm visualized.  IMPRESSION: No acute abnormality noted.   Electronically Signed   By: Alcide Clever M.D.   On: 06/05/2013 16:13    EKG Interpretation   None       MDM   1. Acute pancreatitis    22 year old female presents for epigastric and right upper quadrant pain with onset 2 days ago. Patient nontoxic appearing, hemodynamically stable, and afebrile. Physical exam significant for focal tenderness in the epigastric and medial right upper quadrant. There are no peritoneal signs or guarding appreciated. Labs today significant only for lipase of 241. Patient denies EtOH use and states she only drinks once a week at most. Possible that pancreatitis is  secondary to a passed gallstone. No evidence of cholelithiasis, choledocholithiasis, cholecystitis, or dilation of the common bile duct on abdominal ultrasound today. Patient's symptoms treated with IV fluids, Zofran, and IV narcotics with relief. Have discussed with the patient the options of outpatient management versus inpatient pain control. Believe outpatient management is appropriate for this patient with maximum Ranson score of 2 correlating to 1% predicted mortality. Patient opting for outpatient management at this time and does not wish to be admitted to the hospital for pain control. Will prescribe Percocet and Zofran for symptoms and advised a clear liquid diet until symptoms begin to improve. Strict return precautions given and patient agreeable to plan with no unaddressed concerns.  Antony Madura, PA-C 06/05/13 2243

## 2013-06-05 NOTE — ED Provider Notes (Signed)
Medical screening examination/treatment/procedure(s) were performed by non-physician practitioner and as supervising physician I was immediately available for consultation/collaboration.   Adolf Ormiston B. Bernette Mayers, MD 06/05/13 [phone]

## 2013-08-22 ENCOUNTER — Emergency Department (HOSPITAL_BASED_OUTPATIENT_CLINIC_OR_DEPARTMENT_OTHER)
Admission: EM | Admit: 2013-08-22 | Discharge: 2013-08-23 | Disposition: A | Discharge status: Home / Self Care | Payer: Medicaid Other | Attending: Emergency Medicine | Admitting: Emergency Medicine

## 2013-08-22 ENCOUNTER — Encounter (HOSPITAL_BASED_OUTPATIENT_CLINIC_OR_DEPARTMENT_OTHER): Payer: Self-pay | Admitting: Emergency Medicine

## 2013-08-22 DIAGNOSIS — Z79899 Other long term (current) drug therapy: Secondary | ICD-10-CM | POA: Insufficient documentation

## 2013-08-22 DIAGNOSIS — F17200 Nicotine dependence, unspecified, uncomplicated: Secondary | ICD-10-CM | POA: Insufficient documentation

## 2013-08-22 DIAGNOSIS — N72 Inflammatory disease of cervix uteri: Secondary | ICD-10-CM | POA: Insufficient documentation

## 2013-08-22 DIAGNOSIS — Z3202 Encounter for pregnancy test, result negative: Secondary | ICD-10-CM | POA: Insufficient documentation

## 2013-08-22 DIAGNOSIS — Z792 Long term (current) use of antibiotics: Secondary | ICD-10-CM | POA: Insufficient documentation

## 2013-08-22 DIAGNOSIS — J45909 Unspecified asthma, uncomplicated: Secondary | ICD-10-CM | POA: Insufficient documentation

## 2013-08-22 DIAGNOSIS — Z880 Allergy status to penicillin: Secondary | ICD-10-CM | POA: Insufficient documentation

## 2013-08-22 LAB — URINALYSIS, ROUTINE W REFLEX MICROSCOPIC
BILIRUBIN URINE: NEGATIVE
Glucose, UA: NEGATIVE mg/dL
Hgb urine dipstick: NEGATIVE
Ketones, ur: NEGATIVE mg/dL
NITRITE: NEGATIVE
PROTEIN: NEGATIVE mg/dL
Specific Gravity, Urine: 1.024 (ref 1.005–1.030)
UROBILINOGEN UA: 1 mg/dL (ref 0.0–1.0)
pH: 7.5 (ref 5.0–8.0)

## 2013-08-22 LAB — PREGNANCY, URINE: Preg Test, Ur: NEGATIVE

## 2013-08-22 LAB — URINE MICROSCOPIC-ADD ON

## 2013-08-22 NOTE — ED Notes (Signed)
Abdominal pain for a month. Looks bloated.

## 2013-08-23 ENCOUNTER — Encounter (HOSPITAL_COMMUNITY): Payer: Self-pay | Admitting: Family

## 2013-08-23 ENCOUNTER — Inpatient Hospital Stay (HOSPITAL_COMMUNITY): Payer: Medicaid Other

## 2013-08-23 ENCOUNTER — Inpatient Hospital Stay (EMERGENCY_DEPARTMENT_HOSPITAL)
Admission: AD | Admit: 2013-08-23 | Discharge: 2013-08-23 | Disposition: A | Discharge status: Home / Self Care | Payer: Medicaid Other | Source: Ambulatory Visit | Attending: Family Medicine | Admitting: Family Medicine

## 2013-08-23 DIAGNOSIS — N83209 Unspecified ovarian cyst, unspecified side: Secondary | ICD-10-CM | POA: Insufficient documentation

## 2013-08-23 DIAGNOSIS — R102 Pelvic and perineal pain: Secondary | ICD-10-CM

## 2013-08-23 DIAGNOSIS — N949 Unspecified condition associated with female genital organs and menstrual cycle: Secondary | ICD-10-CM | POA: Insufficient documentation

## 2013-08-23 DIAGNOSIS — F17200 Nicotine dependence, unspecified, uncomplicated: Secondary | ICD-10-CM

## 2013-08-23 DIAGNOSIS — IMO0002 Reserved for concepts with insufficient information to code with codable children: Secondary | ICD-10-CM | POA: Insufficient documentation

## 2013-08-23 HISTORY — DX: Acute pancreatitis without necrosis or infection, unspecified: K85.90

## 2013-08-23 LAB — CBC WITH DIFFERENTIAL/PLATELET
Basophils Absolute: 0 10*3/uL (ref 0.0–0.1)
Basophils Relative: 0 % (ref 0–1)
Eosinophils Absolute: 0.2 10*3/uL (ref 0.0–0.7)
Eosinophils Relative: 2 % (ref 0–5)
HEMATOCRIT: 37.6 % (ref 36.0–46.0)
HEMOGLOBIN: 12.6 g/dL (ref 12.0–15.0)
LYMPHS PCT: 40 % (ref 12–46)
Lymphs Abs: 3.7 10*3/uL (ref 0.7–4.0)
MCH: 32 pg (ref 26.0–34.0)
MCHC: 33.5 g/dL (ref 30.0–36.0)
MCV: 95.4 fL (ref 78.0–100.0)
MONOS PCT: 8 % (ref 3–12)
Monocytes Absolute: 0.7 10*3/uL (ref 0.1–1.0)
Neutro Abs: 4.6 10*3/uL (ref 1.7–7.7)
Neutrophils Relative %: 50 % (ref 43–77)
PLATELETS: 189 10*3/uL (ref 150–400)
RBC: 3.94 MIL/uL (ref 3.87–5.11)
RDW: 12.9 % (ref 11.5–15.5)
WBC: 9.1 10*3/uL (ref 4.0–10.5)

## 2013-08-23 LAB — URINALYSIS, ROUTINE W REFLEX MICROSCOPIC
Bilirubin Urine: NEGATIVE
Glucose, UA: NEGATIVE mg/dL
Hgb urine dipstick: NEGATIVE
Ketones, ur: NEGATIVE mg/dL
LEUKOCYTES UA: NEGATIVE
NITRITE: NEGATIVE
PH: 5.5 (ref 5.0–8.0)
Protein, ur: NEGATIVE mg/dL
Urobilinogen, UA: 0.2 mg/dL (ref 0.0–1.0)

## 2013-08-23 LAB — COMPREHENSIVE METABOLIC PANEL
ALK PHOS: 51 U/L (ref 39–117)
ALT: 18 U/L (ref 0–35)
AST: 18 U/L (ref 0–37)
Albumin: 4 g/dL (ref 3.5–5.2)
BUN: 16 mg/dL (ref 6–23)
CO2: 25 mEq/L (ref 19–32)
Calcium: 9 mg/dL (ref 8.4–10.5)
Chloride: 104 mEq/L (ref 96–112)
Creatinine, Ser: 0.7 mg/dL (ref 0.50–1.10)
GFR calc non Af Amer: >90 mL/min (ref >90)
Glucose, Bld: 93 mg/dL (ref 70–99)
POTASSIUM: 3.6 meq/L — AB (ref 3.7–5.3)
SODIUM: 141 meq/L (ref 137–147)
Total Bilirubin: 0.2 mg/dL — ABNORMAL LOW (ref 0.3–1.2)
Total Protein: 6.8 g/dL (ref 6.0–8.3)

## 2013-08-23 LAB — WET PREP, GENITAL
Clue Cells Wet Prep HPF POC: NONE SEEN
Trich, Wet Prep: NONE SEEN
Yeast Wet Prep HPF POC: NONE SEEN

## 2013-08-23 LAB — POCT PREGNANCY, URINE: Preg Test, Ur: NEGATIVE

## 2013-08-23 LAB — LIPASE, BLOOD: Lipase: 35 U/L (ref 11–59)

## 2013-08-23 MED ORDER — KETOROLAC TROMETHAMINE 60 MG/2ML IM SOLN
60.0000 mg | Freq: Once | INTRAMUSCULAR | Status: AC
  Administered 2013-08-23: 60 mg via INTRAMUSCULAR
  Filled 2013-08-23: qty 2

## 2013-08-23 MED ORDER — KETOROLAC TROMETHAMINE 60 MG/2ML IM SOLN
60.0000 mg | Freq: Once | INTRAMUSCULAR | Status: DC
  Filled 2013-08-23: qty 2

## 2013-08-23 MED ORDER — DOXYCYCLINE HYCLATE 100 MG PO CAPS: 100.0000 mg | ORAL_CAPSULE | Freq: Two times a day (BID) | ORAL | Status: DC

## 2013-08-23 MED ORDER — CEFTRIAXONE SODIUM 250 MG IJ SOLR
INTRAMUSCULAR | Status: AC
  Filled 2013-08-23: qty 250

## 2013-08-23 MED ORDER — TRAMADOL HCL 50 MG PO TABS: 50.0000 mg | ORAL_TABLET | Freq: Four times a day (QID) | ORAL | Status: DC | PRN

## 2013-08-23 MED ORDER — CEFTRIAXONE SODIUM 250 MG IJ SOLR: 250.0000 mg | Freq: Once | INTRAMUSCULAR | Status: DC

## 2013-08-23 MED ORDER — DEXTROSE 5 % IV SOLN
250.0000 mg | Freq: Once | INTRAVENOUS | Status: AC
  Administered 2013-08-23: 250 mg via INTRAVENOUS
  Filled 2013-08-23: qty 250

## 2013-08-23 NOTE — MAU Provider Note (Signed)
Attestation of Attending Supervision of Advanced Practitioner (PA/CNM/NP): Evaluation and management procedures were performed by the Advanced Practitioner under my supervision and collaboration.  I have reviewed the Advanced Practitioner's note and chart, and I agree with the management and plan.  Reva BoresPRATT,TANYA S, MD [facility] Attending 08/23/2013 6:46 PM

## 2013-08-23 NOTE — MAU Note (Signed)
23 yo, LMP 08/06/13, reports to MAU with c/o RLQ and lower back pain x 1 month; report she was seen at [facility] yesterday and was told she had cervicitis and to come here for evaluation.  Denies VB outside of normal periods. Pain with intercourse. Hx significant for recurrent UTIs, pancreatitis, asthma.

## 2013-08-23 NOTE — Discharge Instructions (Signed)
Pelvic Pain Pelvic pain is pain felt below the belly button and between your hips. It can be caused by many different things. It is important to get help right away. This is especially true for severe, sharp, or unusual pain that comes on suddenly.  HOME CARE  Only take medicine as told by your doctor.  Rest as told by your doctor.  Eat a healthy diet, such as fruits, vegetables, and lean meats.  Drink enough fluids to keep your pee (urine) clear or pale yellow, or as told.  Avoid sex (intercourse) if it causes pain.  Apply warm or cold packs to your lower belly (abdomen). Use the type of pack that helps the pain.  Avoid situations that cause you stress.  Keep a journal to track your pain. Write down:  When the pain started.  Where it is located.  If there are things that seem to be related to the pain, such as food or your period.  Follow up with your doctor as told. GET HELP RIGHT AWAY IF:   You have heavy bleeding from the vagina.  You have more pelvic pain.  You feel lightheaded or pass out (faint).  You have chills.  You have pain when you pee or have blood in your pee.  You cannot stop having watery poop (diarrhea).  You cannot stop throwing up (vomiting).  You have a fever or lasting symptoms for more than 3 days.  You have a fever and your symptoms suddenly get worse.  You are being physically or sexually abused.  Your medicine does not help your pain.  You have fluid (discharge) coming from your vagina that is not normal. MAKE SURE YOU:  Understand these instructions.  Will watch your condition.  Will get help if you are not doing well or get worse. Document Released: 01/24/2008 Document Revised: 02/06/2012 Document Reviewed: 11/27/2011 [facility] Patient Information 2014 [place], [place]. Ovarian Cyst An ovarian cyst is a sac filled with fluid or blood. This sac is attached to the ovary. Some cysts go away on their own. Other cysts need treatment.    HOME CARE   Only take medicine as told by your doctor.  Follow up with your doctor as told. GET HELP RIGHT AWAY IF:   You develop sudden pain.  Your belly (abdomen) becomes large or puffy (swollen).  You have a hard time peeing (totally emptying your bladder).  You feel sick most of the time.  You have a temperature by mouth above 102 F (38.9 C), not controlled by medicine.  Your periods are late, not regular, or painful.  Your belly or pelvic pain does not go away.  You have pressure on your bladder.  You have pain during sex.  You feel fullness, pressure, or discomfort in your belly.  You lose weight for no reason. MAKE SURE YOU:   Understand these instructions.  Will watch your condition.  Will get help right away if you are not doing well or get worse. Document Released: 01/24/2008 Document Revised: 10/30/2011 Document Reviewed: 07/09/2009 [facility] Patient Information 2014 [place], [place].

## 2013-08-23 NOTE — MAU Provider Note (Signed)
History     CSN: [id number]  Arrival date and time: 08/23/13 1554   None     No chief complaint on file.  HPI Maria [name] is 23 y.o. [facility] for hx of pelvic pain X 1 month.  Testing was done- cultures are pending.  Dx with Cervicitis, antibiotic and pain med prescribed.  Husband states they haven't gone to get Rxs yet.  Would like to attempt pregnancy but intercourse is painful.  Coughing, bowel movements cause discomfort.   Has monthly cycles pain no associated with cycles.  Menses last 5 days.  They were instructed to come here for additional follow up for this pain.  Past Medical History  Diagnosis Date  . Asthma   . Acute pancreatitis     Past Surgical History  Procedure Laterality Date  . Tonsillectomy    . Wisdom tooth extraction      History reviewed. No pertinent family history.  History  Substance Use Topics  . Smoking status: Current Some Day Smoker -- 0.50 packs/day    Types: Cigarettes  . Smokeless tobacco: Never Used  . Alcohol Use: 0.6 oz/week    1 Cans of beer per week    Allergies:  Allergies  Allergen Reactions  . Penicillins Rash    Prescriptions prior to admission  Medication Sig Dispense Refill  . Multiple Vitamins-Minerals (MULTIVITAMIN PO) Take 1 tablet by mouth daily.      Marland Kitchen. albuterol (PROVENTIL HFA;VENTOLIN HFA) 108 (90 BASE) MCG/ACT inhaler Inhale 2 puffs into the lungs every 6 (six) hours as needed.      . doxycycline (VIBRAMYCIN) 100 MG capsule Take 1 capsule (100 mg total) by mouth 2 (two) times daily. One po bid x 7 days  14 capsule  0  . traMADol (ULTRAM) 50 MG tablet Take 1 tablet (50 mg total) by mouth every 6 (six) hours as needed.  15 tablet  0    Review of Systems  Constitutional: Negative for fever and chills.  Gastrointestinal: Positive for abdominal pain (Lower, bilateral). Negative for nausea, vomiting, diarrhea and constipation.  Genitourinary: Negative for dysuria, urgency  and frequency.       Neg for bleeding, discharge.  + painful intercourse  Neurological: Negative for headaches.   Physical Exam   Blood pressure 115/66, pulse 82, temperature 97.7 F (36.5 C), temperature source Oral, resp. rate 16, last menstrual period 08/06/2013.  Physical Exam  Constitutional: She is oriented to person, place, and time. She appears well-developed and well-nourished. No distress.  HENT:  Head: Normocephalic.  Neck: Normal range of motion.  Cardiovascular: Normal rate.   Respiratory: Effort normal.  GI: Soft. She exhibits no distension and no mass. There is tenderness (generalized lower abdominal moderate tendernss). There is no rebound and no guarding.  Genitourinary: There is no rash, tenderness or lesion on the right labia. There is no rash, tenderness or lesion on the left labia. Uterus is not enlarged and not tender. Cervix exhibits motion tenderness. Cervix exhibits no discharge and no friability. Right adnexum displays tenderness and fullness. Right adnexum displays no mass. Left adnexum displays tenderness. Left adnexum displays no mass and no fullness. No erythema, tenderness or bleeding around the vagina. Vaginal discharge (moderate amount of white discharge) found.  Neurological: She is alert and oriented to person, place, and time.  Skin: Skin is warm and dry.  Psychiatric: She has a normal mood and affect. Her behavior is normal.  CBC/diff drawn and wet prep done at [facility] both were wnl  Results for orders placed during the hospital encounter of 08/23/13 (from the past 24 hour(s))  URINALYSIS, ROUTINE W REFLEX MICROSCOPIC     Status: Abnormal   Collection Time    08/23/13  4:16 PM      Result Value Range   Color, Urine YELLOW  YELLOW   APPearance CLEAR  CLEAR   Specific Gravity, Urine <1.005 (*) 1.005 - 1.030   pH 5.5  5.0 - 8.0   Glucose, UA NEGATIVE  NEGATIVE mg/dL   Hgb urine dipstick NEGATIVE  NEGATIVE   Bilirubin Urine NEGATIVE  NEGATIVE    Ketones, ur NEGATIVE  NEGATIVE mg/dL   Protein, ur NEGATIVE  NEGATIVE mg/dL   Urobilinogen, UA 0.2  0.0 - 1.0 mg/dL   Nitrite NEGATIVE  NEGATIVE   Leukocytes, UA NEGATIVE  NEGATIVE  POCT PREGNANCY, URINE     Status: None   Collection Time    08/23/13  4:16 PM      Result Value Range   Preg Test, Ur NEGATIVE  NEGATIVE   US Transvaginal Non-ob  08/23/2013   CLINICAL DATA:  Right-sided pelvic pain.  LMP 08/06/2013.  EXAM: TRANSABDOMINAL AND TRANSVAGINAL ULTRASOUND OF PELVIS  TECHNIQUE: Both transabdominal and transvaginal ultrasound examinations of the pelvis were performed. Transabdominal technique was performed for global imaging of the pelvis including uterus, ovaries, adnexal regions, and pelvic cul-de-sac. It was necessary to proceed with endovaginal exam following the transabdominal exam to visualize the endometrium and cystic lesion in right adnexa.  COMPARISON:  None  FINDINGS: Uterus  Measurements: 7.7 x 3.4 x 4.4 cm. No fibroids or other mass visualized.  Endometrium  Thickness: 7 mm.  No focal abnormality visualized.  Right ovary  Measurements: 6.5 x 4.8 x 5.9 cm. A cyst containing some peripheral internal blood clot is seen, with no internal blood flow. This measures 6.0 x 4.5 x 5.5 cm common is consistent with a benign hemorrhagic cyst.  Left ovary  Measurements: 2.3 x 1.5 x 1.0 cm. Normal appearance/no adnexal mass.  Other findings  Tiny amount of free fluid in pelvic cul-de-sac.  IMPRESSION: 6 cm right ovarian hemorrhagic cyst. Followup by transvaginal pelvic ultrasound recommended in 6-12 weeks to confirm resolution. This recommendation follows the consensus statement: Management of Asymptomatic Ovarian and Other Adnexal Cysts Imaged at [place]: Society of Radiologists in Ultrasound Consensus Conference Statement. Radiology 2010; [id number].   Electronically Signed   By: Myles Rosenthal M.D.   On: 08/23/2013 18:09   US Pelvis Complete  08/23/2013   CLINICAL DATA:  Right-sided pelvic pain.  LMP  08/06/2013.  EXAM: TRANSABDOMINAL AND TRANSVAGINAL ULTRASOUND OF PELVIS  TECHNIQUE: Both transabdominal and transvaginal ultrasound examinations of the pelvis were performed. Transabdominal technique was performed for global imaging of the pelvis including uterus, ovaries, adnexal regions, and pelvic cul-de-sac. It was necessary to proceed with endovaginal exam following the transabdominal exam to visualize the endometrium and cystic lesion in right adnexa.  COMPARISON:  None  FINDINGS: Uterus  Measurements: 7.7 x 3.4 x 4.4 cm. No fibroids or other mass visualized.  Endometrium  Thickness: 7 mm.  No focal abnormality visualized.  Right ovary  Measurements: 6.5 x 4.8 x 5.9 cm. A cyst containing some peripheral internal blood clot is seen, with no internal blood flow. This measures 6.0 x 4.5 x 5.5 cm common is consistent with a benign hemorrhagic cyst.  Left ovary  Measurements: 2.3 x 1.5 x 1.0 cm. Normal  appearance/no adnexal mass.  Other findings  Tiny amount of free fluid in pelvic cul-de-sac.  IMPRESSION: 6 cm right ovarian hemorrhagic cyst. Followup by transvaginal pelvic ultrasound recommended in 6-12 weeks to confirm resolution. This recommendation follows the consensus statement: Management of Asymptomatic Ovarian and Other Adnexal Cysts Imaged at [place]: Society of Radiologists in Ultrasound Consensus Conference Statement. Radiology 2010; [id number].   Electronically Signed   By: Myles Rosenthal M.D.   On: 08/23/2013 18:09   MAU Course  Procedures  Toradol 60mg  IM given.  Patient has not taken anything for pain today.  MDM Patient and her husband asked about endometriosis--discussed disorder, diagnosis and treatment.  Reviewed lab and Ultrasound reports.  Explained follow up and plan of care  Assessment and Plan  A:  Pelvic Pain     Dyspaurenia     6cm right ovarian hemorrhagic cyst  P:  Referral made to GYN CLINIC for appt for evaluation and request for U/S a few days before for follow up       Encouraged to pick Rx from pharmacy and take as instructed      Instructed to avoid additional NSAIDS until tomorrow  Coutney Wildermuth,EVE M 08/23/2013, 6:26 PM

## 2013-08-23 NOTE — ED Provider Notes (Signed)
CSN: [id number]     Arrival date & time 08/22/13  2113 History   First MD Initiated Contact with Patient 08/22/13 2356     Chief Complaint  Patient presents with  . Abdominal Pain   (Consider location/radiation/quality/duration/timing/severity/associated sxs/prior Treatment) Patient is a 23 y.o. female presenting with abdominal pain.  Abdominal Pain Associated symptoms: vaginal discharge   Associated symptoms: no chest pain, no chills, no constipation, no diarrhea, no dysuria, no fever, no nausea, no shortness of breath, no vaginal bleeding and no vomiting    Patient presents with lower abdominal pain for the past month. She states that she has dyspareunia and a white vaginal discharge. She denies any fevers or chills. She's had no dysuria or flank tenderness. She denies any nausea vomiting especially associated with food. She has no recent visits to see an OB/GYN. Past Medical History  Diagnosis Date  . Asthma    Past Surgical History  Procedure Laterality Date  . Tonsillectomy    . Wisdom tooth extraction     No family history on file. History  Substance Use Topics  . Smoking status: Current Some Day Smoker -- 0.50 packs/day    Types: Cigarettes  . Smokeless tobacco: Never Used  . Alcohol Use: 0.6 oz/week    1 Cans of beer per week   OB History   Grav Para Term Preterm Abortions TAB SAB Ect Mult Living                 Review of Systems  Constitutional: Negative for fever and chills.  Respiratory: Negative for shortness of breath.   Cardiovascular: Negative for chest pain.  Gastrointestinal: Positive for abdominal pain. Negative for nausea, vomiting, diarrhea and constipation.  Genitourinary: Positive for vaginal discharge and pelvic pain. Negative for dysuria, flank pain, vaginal bleeding and difficulty urinating.  Musculoskeletal: Negative for back pain, neck pain and neck stiffness.  Neurological: Negative for dizziness, tremors, weakness, light-headedness and  headaches.  All other systems reviewed and are negative.    Allergies  Penicillins  Home Medications   Current Outpatient Rx  Name  Route  Sig  Dispense  Refill  . albuterol (PROVENTIL HFA;VENTOLIN HFA) 108 (90 BASE) MCG/ACT inhaler   Inhalation   Inhale 2 puffs into the lungs every 6 (six) hours as needed.         . doxycycline (VIBRAMYCIN) 100 MG capsule   Oral   Take 1 capsule (100 mg total) by mouth 2 (two) times daily. One po bid x 7 days   14 capsule   0   . ondansetron (ZOFRAN) 4 MG tablet   Oral   Take 1 tablet (4 mg total) by mouth every 6 (six) hours.   12 tablet   0   . oxyCODONE-acetaminophen (PERCOCET/ROXICET) 5-325 MG per tablet   Oral   Take 1-2 tablets by mouth every 6 (six) hours as needed for pain.   27 tablet   0   . traMADol (ULTRAM) 50 MG tablet   Oral   Take 1 tablet (50 mg total) by mouth every 6 (six) hours as needed.   15 tablet   0    BP 132/86  Pulse 91  Temp(Src) 98.9 F (37.2 C) (Oral)  Resp 22  Ht 5\' 4"  (1.626 m)  Wt 120 lb (54.432 kg)  BMI 20.59 kg/m2  SpO2 100%  LMP 08/06/2013 Physical Exam  Nursing note and vitals reviewed. Constitutional: She is oriented to person, place, and time. She appears well-developed and  well-nourished. No distress.  HENT:  Head: Normocephalic and atraumatic.  Mouth/Throat: Oropharynx is clear and moist.  Eyes: EOM are normal. Pupils are equal, round, and reactive to light.  Neck: Normal range of motion. Neck supple.  Cardiovascular: Normal rate and regular rhythm.   Pulmonary/Chest: Effort normal and breath sounds normal. No respiratory distress. She has no wheezes. She has no rales.  Abdominal: Soft. Bowel sounds are normal. She exhibits no distension and no mass. There is tenderness (mild suprapubic tenderness to palpation. No McBurney point tenderness.). There is no rebound and no guarding.  Genitourinary:  Yellow vaginal discharge with cervical motion tenderness. Mild frontal and right  adnexal tenderness to palpation.  Musculoskeletal: Normal range of motion. She exhibits no edema and no tenderness.  No CVA tenderness bilaterally.  Neurological: She is alert and oriented to person, place, and time.  Moves all extremities without deficit. Sensation grossly intact.  Skin: Skin is warm and dry. No rash noted. No erythema.  Psychiatric: She has a normal mood and affect. Her behavior is normal.    ED Course  Procedures (including critical care time) Labs Review Labs Reviewed  WET PREP, GENITAL - Abnormal; Notable for the following:    WBC, Wet Prep HPF POC FEW (*)    All other components within normal limits  URINALYSIS, ROUTINE W REFLEX MICROSCOPIC - Abnormal; Notable for the following:    APPearance CLOUDY (*)    Leukocytes, UA TRACE (*)    All other components within normal limits  URINE MICROSCOPIC-ADD ON - Abnormal; Notable for the following:    Squamous Epithelial / LPF FEW (*)    Bacteria, UA FEW (*)    All other components within normal limits  COMPREHENSIVE METABOLIC PANEL - Abnormal; Notable for the following:    Potassium 3.6 (*)    Total Bilirubin 0.2 (*)    All other components within normal limits  GC/CHLAMYDIA PROBE AMP  PREGNANCY, URINE  CBC WITH DIFFERENTIAL  LIPASE, BLOOD   Imaging Review No results found.  EKG Interpretation   None       MDM   1. Cervicitis    Patient's exam and symptoms seem consistent with cervicitis. We'll treat empirically and patient is advised to followup with OB/GYN and have all sexual partners evaluated. Return precautions have been given and the patient's voice understanding. She has a soft abdomen I do not suspect a surgical emergency such as appendicitis.    Loren Raceravid Julanne Schlueter, MD 08/23/13 [phone]

## 2013-08-23 NOTE — MAU Note (Signed)
Discussed smoking cessation. Patient not ready to quit smoking. Educated on risks of smoking and benefits of cessation.

## 2013-08-23 NOTE — Discharge Instructions (Signed)
Cervicitis °Cervicitis is a soreness and swelling (inflammation) of the cervix. Your cervix is located at the bottom of your uterus. It opens up to the vagina. °CAUSES  °· Sexually transmitted infections (STIs).   °· Allergic reaction.   °· Medicines or birth control devices that are put in the vagina.   °· Injury to the cervix.   °· Bacterial infections.   °RISK FACTORS °You are at greater risk if you: °· Have unprotected sexual intercourse. °· Have sexual intercourse with many partners. °· Began sexual intercourse at an early age. °· Have a history of STIs. °SYMPTOMS  °There may be no symptoms. If symptoms occur, they may include:  °· Grey, white, yellow, or bad-smelling vaginal discharge.   °· Pain or itching of the area outside the vagina.   °· Painful sexual intercourse.   °· Lower abdominal or lower back pain, especially during intercourse.   °· Frequent urination.   °· Abnormal vaginal bleeding between periods, after sexual intercourse, or after menopause.   °· Pressure or a heavy feeling in the pelvis.   °DIAGNOSIS  °Diagnosis is made after a pelvic exam. Other tests may include:  °· Examination of any discharge under a microscope (wet prep).   °· A Pap test.   °TREATMENT  °Treatment will depend on the cause of cervicitis. If it is caused by an STI, both you and your partner will need to be treated. Antibiotic medicines will be given.  °HOME CARE INSTRUCTIONS  °· Do not have sexual intercourse until your health care provider says it is okay.   °· Do not have sexual intercourse until your partner has been treated, if your cervicitis is caused by an STI.   °· Take your antibiotics as directed. Finish them even if you start to feel better.   °SEEK MEDICAL CARE IF: °· Your symptoms come back.   °· You have a fever.   °MAKE SURE YOU:  °· Understand these instructions. °· Will watch your condition. °· Will get help right away if you are not doing well or get worse. °Document Released: 08/07/2005 Document Revised:  04/09/2013 Document Reviewed: 01/29/2013 °[employer]® Patient Information ©2014 [employer]. ° °

## 2013-08-25 LAB — GC/CHLAMYDIA PROBE AMP
CT Probe RNA: NEGATIVE
GC Probe RNA: NEGATIVE

## 2013-10-08 ENCOUNTER — Emergency Department: Payer: Self-pay | Admitting: Emergency Medicine

## 2013-10-08 LAB — COMPREHENSIVE METABOLIC PANEL
Albumin: 4 g/dL (ref 3.4–5.0)
Alkaline Phosphatase: 63 U/L
Anion Gap: 6 — ABNORMAL LOW (ref 7–16)
BILIRUBIN TOTAL: 0.5 mg/dL (ref 0.2–1.0)
BUN: 13 mg/dL (ref 7–18)
Calcium, Total: 9.2 mg/dL (ref 8.5–10.1)
Chloride: 107 mmol/L (ref 98–107)
Co2: 27 mmol/L (ref 21–32)
Creatinine: 0.82 mg/dL (ref 0.60–1.30)
EGFR (African American): >60
Glucose: 72 mg/dL (ref 65–99)
Osmolality: 278 (ref 275–301)
Potassium: 3.7 mmol/L (ref 3.5–5.1)
SGOT(AST): 22 U/L (ref 15–37)
SGPT (ALT): 20 U/L (ref 12–78)
Sodium: 140 mmol/L (ref 136–145)
Total Protein: 7.4 g/dL (ref 6.4–8.2)

## 2013-10-08 LAB — CBC WITH DIFFERENTIAL/PLATELET
BASOS ABS: 0 10*3/uL (ref 0.0–0.1)
Basophil %: 0.4 %
Eosinophil #: 0.1 10*3/uL (ref 0.0–0.7)
Eosinophil %: 1.1 %
HCT: 38.4 % (ref 35.0–47.0)
HGB: 13.1 g/dL (ref 12.0–16.0)
Lymphocyte #: 1.9 10*3/uL (ref 1.0–3.6)
Lymphocyte %: 25.6 %
MCH: 32.1 pg (ref 26.0–34.0)
MCHC: 34.2 g/dL (ref 32.0–36.0)
MCV: 94 fL (ref 80–100)
MONOS PCT: 8.2 %
Monocyte #: 0.6 x10 3/mm (ref 0.2–0.9)
Neutrophil #: 4.8 10*3/uL (ref 1.4–6.5)
Neutrophil %: 64.7 %
Platelet: 210 10*3/uL (ref 150–440)
RBC: 4.09 10*6/uL (ref 3.80–5.20)
RDW: 13.5 % (ref 11.5–14.5)
WBC: 7.3 10*3/uL (ref 3.6–11.0)

## 2013-10-08 LAB — URINALYSIS, COMPLETE
Bilirubin,UR: NEGATIVE
Blood: NEGATIVE
GLUCOSE, UR: NEGATIVE mg/dL (ref 0–75)
Ketone: NEGATIVE
NITRITE: NEGATIVE
Ph: 5 (ref 4.5–8.0)
RBC,UR: 2 /HPF (ref 0–5)
Specific Gravity: 1.028 (ref 1.003–1.030)
Squamous Epithelial: 96
WBC UR: 14 /HPF (ref 0–5)

## 2013-10-08 LAB — LIPASE, BLOOD: LIPASE: 111 U/L (ref 73–393)

## 2013-12-03 ENCOUNTER — Inpatient Hospital Stay (HOSPITAL_COMMUNITY)
Admission: AD | Admit: 2013-12-03 | Discharge: 2013-12-04 | Disposition: A | Discharge status: Home / Self Care | Payer: Medicaid Other | Source: Ambulatory Visit | Attending: Obstetrics & Gynecology | Admitting: Obstetrics & Gynecology

## 2013-12-03 ENCOUNTER — Inpatient Hospital Stay (HOSPITAL_COMMUNITY): Payer: Medicaid Other

## 2013-12-03 ENCOUNTER — Encounter (HOSPITAL_COMMUNITY): Payer: Self-pay | Admitting: *Deleted

## 2013-12-03 DIAGNOSIS — R1031 Right lower quadrant pain: Secondary | ICD-10-CM | POA: Insufficient documentation

## 2013-12-03 DIAGNOSIS — IMO0002 Reserved for concepts with insufficient information to code with codable children: Secondary | ICD-10-CM | POA: Insufficient documentation

## 2013-12-03 DIAGNOSIS — Z8742 Personal history of other diseases of the female genital tract: Secondary | ICD-10-CM

## 2013-12-03 DIAGNOSIS — M545 Low back pain, unspecified: Secondary | ICD-10-CM | POA: Insufficient documentation

## 2013-12-03 DIAGNOSIS — F17200 Nicotine dependence, unspecified, uncomplicated: Secondary | ICD-10-CM | POA: Insufficient documentation

## 2013-12-03 DIAGNOSIS — R300 Dysuria: Secondary | ICD-10-CM | POA: Insufficient documentation

## 2013-12-03 DIAGNOSIS — N760 Acute vaginitis: Secondary | ICD-10-CM | POA: Insufficient documentation

## 2013-12-03 DIAGNOSIS — A499 Bacterial infection, unspecified: Secondary | ICD-10-CM | POA: Insufficient documentation

## 2013-12-03 DIAGNOSIS — B9689 Other specified bacterial agents as the cause of diseases classified elsewhere: Secondary | ICD-10-CM | POA: Insufficient documentation

## 2013-12-03 LAB — POCT PREGNANCY, URINE: PREG TEST UR: NEGATIVE

## 2013-12-03 LAB — URINALYSIS, ROUTINE W REFLEX MICROSCOPIC
Bilirubin Urine: NEGATIVE
Glucose, UA: NEGATIVE mg/dL
Hgb urine dipstick: NEGATIVE
Ketones, ur: NEGATIVE mg/dL
LEUKOCYTES UA: NEGATIVE
Nitrite: NEGATIVE
PROTEIN: NEGATIVE mg/dL
Specific Gravity, Urine: 1.025 (ref 1.005–1.030)
Urobilinogen, UA: 0.2 mg/dL (ref 0.0–1.0)
pH: 5.5 (ref 5.0–8.0)

## 2013-12-03 LAB — WET PREP, GENITAL
TRICH WET PREP: NONE SEEN
YEAST WET PREP: NONE SEEN

## 2013-12-03 LAB — CBC
HCT: 37.6 % (ref 36.0–46.0)
HEMOGLOBIN: 12.8 g/dL (ref 12.0–15.0)
MCH: 31.8 pg (ref 26.0–34.0)
MCHC: 34 g/dL (ref 30.0–36.0)
MCV: 93.3 fL (ref 78.0–100.0)
Platelets: 187 10*3/uL (ref 150–400)
RBC: 4.03 MIL/uL (ref 3.87–5.11)
RDW: 13.4 % (ref 11.5–15.5)
WBC: 6.4 10*3/uL (ref 4.0–10.5)

## 2013-12-03 MED ORDER — TRAMADOL HCL 50 MG PO TABS
100.0000 mg | ORAL_TABLET | Freq: Once | ORAL | Status: AC
Start: 2013-12-03 — End: 2013-12-04
  Administered 2013-12-04: 100 mg via ORAL
  Filled 2013-12-03: qty 2

## 2013-12-03 MED ORDER — KETOROLAC TROMETHAMINE 60 MG/2ML IM SOLN
60.0000 mg | Freq: Once | INTRAMUSCULAR | Status: AC
  Administered 2013-12-03: 60 mg via INTRAMUSCULAR
  Filled 2013-12-03: qty 2

## 2013-12-03 NOTE — MAU Note (Signed)
Frequency, urgency some pain with urination

## 2013-12-03 NOTE — MAU Note (Signed)
Pt had a cyst that ruptured a couple months ago. Pt is now having the same symptoms now. Pt reports pain in her R side and lower back for a few weeks that is getting progressively worse. Pt reports thick white discharge that has an odor. Pt also reports painful intercourse. Pt reports that she can "feel something" in her abdomen and is having frequency.

## 2013-12-03 NOTE — MAU Note (Signed)
Recently had a cyst that ruptured.  Seem to be having the same symptoms.  Pain started about a wk ago.

## 2013-12-03 NOTE — MAU Provider Note (Signed)
Chief Complaint: Abdominal Pain   First Provider Initiated Contact with Patient 12/03/13 2047     SUBJECTIVE HPI: Maria [name] is a 23 y.o. G0 female who presents with RLQ pain radiating around to right low back x 2-3 weeks, similar to pain she had w/ 6 cm hemorrhagic cyst 3 months ago. Has not followed UP RE: cyst. Describes pain as constant, sharp. Rates pain 6-7/10 on pain scale. There are no aggravating or alleviating factors. Took ibuprofen last weekend with partial improvement of pain. Also reports malodorous discharge since around the time that the pain began and frequency, dysuria times several months. Has been taking Cystex for urinary symptoms without improvement. Negative urine culture January 2015. Has not seen primary care provider or urologist for urinary complaints.  Past Medical History  Diagnosis Date  . Asthma   . Acute pancreatitis    OB History  No data available   Past Surgical History  Procedure Laterality Date  . Tonsillectomy    . Wisdom tooth extraction     History   Social History  . Marital Status: Married    Spouse Name: N/A    Number of Children: N/A  . Years of Education: N/A   Occupational History  . Not on file.   Social History Main Topics  . Smoking status: Current Some Day Smoker -- 0.50 packs/day    Types: Cigarettes  . Smokeless tobacco: Never Used  . Alcohol Use: 0.6 oz/week    1 Cans of beer per week  . Drug Use: No  . Sexual Activity: Yes    Birth Control/ Protection: None   Other Topics Concern  . Not on file   Social History Narrative  . No narrative on file   No current facility-administered medications on file prior to encounter.   Current Outpatient Prescriptions on File Prior to Encounter  Medication Sig Dispense Refill  . Multiple Vitamins-Minerals (MULTIVITAMIN PO) Take 1 tablet by mouth daily.       Allergies  Allergen Reactions  . Penicillins Rash    ROS: Pertinent positive items in HPI. Long history of  dyspareunia. Negative for fever, chills, nausea, vomiting, diarrhea, constipation, vaginal bleeding, loss of appetite, hematuria, urgency or flank pain.  OBJECTIVE Blood pressure 124/82, pulse 80, temperature 98.4 F (36.9 C), temperature source Oral, resp. rate 16, height 5\' 1"  (1.549 m), weight 50.349 kg (111 lb), last menstrual period 11/23/2013. GENERAL: Well-developed, well-nourished female in mild distress.  HEENT: Normocephalic HEART: normal rate RESP: normal effort ABDOMEN: Soft, mild right lower quadrant/groin pain. Positive bowel sounds x4. Negative rebound tenderness or mass. BACK: No CVA tenderness. No low-back tenderness. Normal range of motion. EXTREMITIES: Nontender, no edema NEURO: Alert and oriented SPECULUM EXAM: NEFG, moderate amount of thin, white, malodorous discharge, no blood noted, cervix clean BIMANUAL: cervix closed; uterus normal size. Generalized tenderness throughout exam, but nonspecific cervical motion tenderness.  LAB RESULTS Results for orders placed during the hospital encounter of 12/03/13 (from the past 24 hour(s))  URINALYSIS, ROUTINE W REFLEX MICROSCOPIC     Status: None   Collection Time    12/03/13  6:40 PM      Result Value Ref Range   Color, Urine YELLOW  YELLOW   APPearance CLEAR  CLEAR   Specific Gravity, Urine 1.025  1.005 - 1.030   pH 5.5  5.0 - 8.0   Glucose, UA NEGATIVE  NEGATIVE mg/dL   Hgb urine dipstick NEGATIVE  NEGATIVE   Bilirubin Urine NEGATIVE  NEGATIVE  Ketones, ur NEGATIVE  NEGATIVE mg/dL   Protein, ur NEGATIVE  NEGATIVE mg/dL   Urobilinogen, UA 0.2  0.0 - 1.0 mg/dL   Nitrite NEGATIVE  NEGATIVE   Leukocytes, UA NEGATIVE  NEGATIVE  POCT PREGNANCY, URINE     Status: None   Collection Time    12/03/13  7:23 PM      Result Value Ref Range   Preg Test, Ur NEGATIVE  NEGATIVE  CBC     Status: None   Collection Time    12/03/13 10:30 PM      Result Value Ref Range   WBC 6.4  4.0 - 10.5 K/uL   RBC 4.03  3.87 - 5.11 MIL/uL    Hemoglobin 12.8  12.0 - 15.0 g/dL   HCT 16.1  09.6 - 04.5 %   MCV 93.3  78.0 - 100.0 fL   MCH 31.8  26.0 - 34.0 pg   MCHC 34.0  30.0 - 36.0 g/dL   RDW 40.9  81.1 - 91.4 %   Platelets 187  150 - 400 K/uL  WET PREP, GENITAL     Status: Abnormal   Collection Time    12/03/13 11:10 PM      Result Value Ref Range   Yeast Wet Prep HPF POC NONE SEEN  NONE SEEN   Trich, Wet Prep NONE SEEN  NONE SEEN   Clue Cells Wet Prep HPF POC FEW (*) NONE SEEN   WBC, Wet Prep HPF POC FEW (*) NONE SEEN    IMAGING US Transvaginal Non-ob  12/03/2013   CLINICAL DATA:  Right lower quadrant pain, follow-up hemorrhagic cyst  EXAM: TRANSABDOMINAL AND TRANSVAGINAL ULTRASOUND OF PELVIS  TECHNIQUE: Both transabdominal and transvaginal ultrasound examinations of the pelvis were performed. Transabdominal technique was performed for global imaging of the pelvis including uterus, ovaries, adnexal regions, and pelvic cul-de-sac. It was necessary to proceed with endovaginal exam following the transabdominal exam to visualize the right ovary.  COMPARISON:  08/23/2013  FINDINGS: Uterus  Measurements: 6.6 x 2.5 x 3.7 cm. No fibroids or other mass visualized.  Endometrium  Thickness: 3 mm.  No focal abnormality visualized.  Right ovary  Measurements: 4.1 x 2.1 x 2.0 cm. Small cysts/follicles measuring up to 1.5 cm.  Left ovary  Measurements: 3.0 x 1.5 x 2.0 cm. Normal appearance/no adnexal mass.  Other findings  No free fluid.  IMPRESSION: Negative pelvic ultrasound.  Prior large right ovarian cyst has resolved.   Electronically Signed   By: Charline Bills M.D.   On: 12/03/2013 22:00   US Pelvis Complete  12/03/2013   CLINICAL DATA:  Right lower quadrant pain, follow-up hemorrhagic cyst  EXAM: TRANSABDOMINAL AND TRANSVAGINAL ULTRASOUND OF PELVIS  TECHNIQUE: Both transabdominal and transvaginal ultrasound examinations of the pelvis were performed. Transabdominal technique was performed for global imaging of the pelvis including  uterus, ovaries, adnexal regions, and pelvic cul-de-sac. It was necessary to proceed with endovaginal exam following the transabdominal exam to visualize the right ovary.  COMPARISON:  08/23/2013  FINDINGS: Uterus  Measurements: 6.6 x 2.5 x 3.7 cm. No fibroids or other mass visualized.  Endometrium  Thickness: 3 mm.  No focal abnormality visualized.  Right ovary  Measurements: 4.1 x 2.1 x 2.0 cm. Small cysts/follicles measuring up to 1.5 cm.  Left ovary  Measurements: 3.0 x 1.5 x 2.0 cm. Normal appearance/no adnexal mass.  Other findings  No free fluid.  IMPRESSION: Negative pelvic ultrasound.  Prior large right ovarian cyst has resolved.   Electronically  Signed   By: Charline BillsSriyesh  Krishnan M.D.   On: 12/03/2013 22:00    MAU COURSE No improvement in pain after Toradol. Ultram ordered.  Pain improved significantly. Low suspicion for appendicitis due to location of pain and lack of fever, leukocytosis or GI complaints.  ASSESSMENT 1. BV (bacterial vaginosis)   2. Dysuria   3. History of ovarian cyst    PLAN Discharge home in stable condition. Increase fluids. Urine culture and GC/CT cultures pending.     Follow-up Information   Follow up with Gynecologist. (for routine gynecology care and pain with intercourse)       Follow up with [facility]. (As needed for urinary problems)    Contact information:   [address] [place] [address] [phone]       Medication List    STOP taking these medications       CYSTEX 162-162.5 MG Tabs  Generic drug:  Methenamine-Sodium Salicylate     doxycycline 100 MG capsule  Commonly known as:  VIBRAMYCIN      TAKE these medications       metroNIDAZOLE 500 MG tablet  Commonly known as:  FLAGYL  Take 1 tablet (500 mg total) by mouth 2 (two) times daily.     MULTIVITAMIN PO  Take 1 tablet by mouth daily.     phenazopyridine 200 MG tablet  Commonly known as:  PYRIDIUM  Take 1 tablet (200 mg total) by mouth 3 (three)  times daily as needed for pain (Bladder spasms).     traMADol 50 MG tablet  Commonly known as:  ULTRAM  Take 1-2 tablets (50-100 mg total) by mouth every 6 (six) hours as needed.       [place] Emmajane Altamura, CNM 12/04/2013  12:26 AM

## 2013-12-04 DIAGNOSIS — A499 Bacterial infection, unspecified: Secondary | ICD-10-CM

## 2013-12-04 DIAGNOSIS — B9689 Other specified bacterial agents as the cause of diseases classified elsewhere: Secondary | ICD-10-CM

## 2013-12-04 DIAGNOSIS — N760 Acute vaginitis: Secondary | ICD-10-CM

## 2013-12-04 LAB — GC/CHLAMYDIA PROBE AMP
CT PROBE, AMP APTIMA: NEGATIVE
GC PROBE AMP APTIMA: NEGATIVE

## 2013-12-04 MED ORDER — PHENAZOPYRIDINE HCL 200 MG PO TABS: 200.0000 mg | ORAL_TABLET | Freq: Three times a day (TID) | ORAL | Status: DC | PRN

## 2013-12-04 MED ORDER — METRONIDAZOLE 500 MG PO TABS: 500.0000 mg | ORAL_TABLET | Freq: Two times a day (BID) | ORAL | Status: DC

## 2013-12-04 MED ORDER — TRAMADOL HCL 50 MG PO TABS: 50.0000 mg | ORAL_TABLET | Freq: Four times a day (QID) | ORAL | Status: DC | PRN

## 2013-12-04 NOTE — Discharge Instructions (Signed)
Bacterial Vaginosis Bacterial vaginosis is a vaginal infection that occurs when the normal balance of bacteria in the vagina is disrupted. It results from an overgrowth of certain bacteria. This is the most common vaginal infection in women of childbearing age. Treatment is important to prevent complications, especially in pregnant women, as it can cause a premature delivery. CAUSES  Bacterial vaginosis is caused by an increase in harmful bacteria that are normally present in smaller amounts in the vagina. Several different kinds of bacteria can cause bacterial vaginosis. However, the reason that the condition develops is not fully understood. RISK FACTORS Certain activities or behaviors can put you at an increased risk of developing bacterial vaginosis, including:  Having a new sex partner or multiple sex partners.  Douching.  Using an intrauterine device (IUD) for contraception. Women do not get bacterial vaginosis from toilet seats, bedding, swimming pools, or contact with objects around them. SIGNS AND SYMPTOMS  Some women with bacterial vaginosis have no signs or symptoms. Common symptoms include:  Grey vaginal discharge.  A fishlike odor with discharge, especially after sexual intercourse.  Itching or burning of the vagina and vulva.  Burning or pain with urination. DIAGNOSIS  Your health care provider will take a medical history and examine the vagina for signs of bacterial vaginosis. A sample of vaginal fluid may be taken. Your health care provider will look at this sample under a microscope to check for bacteria and abnormal cells. A vaginal pH test may also be done.  TREATMENT  Bacterial vaginosis may be treated with antibiotic medicines. These may be given in the form of a pill or a vaginal cream. A second round of antibiotics may be prescribed if the condition comes back after treatment.  HOME CARE INSTRUCTIONS   Only take over-the-counter or prescription medicines as  directed by your health care provider.  If antibiotic medicine was prescribed, take it as directed. Make sure you finish it even if you start to feel better.  Do not have sex until treatment is completed.  Tell all sexual partners that you have a vaginal infection. They should see their health care provider and be treated if they have problems, such as a mild rash or itching.  Practice safe sex by using condoms and only having one sex partner. SEEK MEDICAL CARE IF:   Your symptoms are not improving after 3 days of treatment.  You have increased discharge or pain.  You have a fever. MAKE SURE YOU: Pelvic Pain, Female Female pelvic pain can be caused by many different things and start from a variety of places. Pelvic pain refers to pain that is located in the lower half of the abdomen and between your hips. The pain may occur over a short period of time (acute) or may be reoccurring (chronic). The cause of pelvic pain may be related to disorders affecting the female reproductive organs (gynecologic), but it may also be related to the bladder, kidney stones, an intestinal complication, or muscle or skeletal problems. Getting help right away for pelvic pain is important, especially if there has been severe, sharp, or a sudden onset of unusual pain. It is also important to get help right away because some types of pelvic pain can be life threatening.  CAUSES  Below are only some of the causes of pelvic pain. The causes of pelvic pain can be in one of several categories.   Gynecologic.  Pelvic inflammatory disease.  Sexually transmitted infection.  Ovarian cyst or a twisted ovarian ligament (  ovarian torsion).  Uterine lining that grows outside the uterus (endometriosis).  Fibroids, cysts, or tumors.  Ovulation.  Pregnancy.  Pregnancy that occurs outside the uterus (ectopic pregnancy).  Miscarriage.  Labor.  Abruption of the placenta or ruptured uterus.  Infection.  Uterine  infection (endometritis).  Bladder infection.  Diverticulitis.  Miscarriage related to a uterine infection (septic abortion).  Bladder.  Inflammation of the bladder (cystitis).  Kidney stone(s).  Gastrointenstinal.  Constipation.  Diverticulitis.  Neurologic.  Trauma.  Feeling pelvic pain because of mental or emotional causes (psychosomatic).  Cancers of the bowel or pelvis. EVALUATION  Your caregiver will want to take a careful history of your concerns. This includes recent changes in your health, a careful gynecologic history of your periods (menses), and a sexual history. Obtaining your family history and medical history is also important. Your caregiver may suggest a pelvic exam. A pelvic exam will help identify the location and severity of the pain. It also helps in the evaluation of which organ system may be involved. In order to identify the cause of the pelvic pain and be properly treated, your caregiver may order tests. These tests may include:   A pregnancy test.  Pelvic ultrasonography.  An X-ray exam of the abdomen.  A urinalysis or evaluation of vaginal discharge.  Blood tests. HOME CARE INSTRUCTIONS   Only take over-the-counter or prescription medicines for pain, discomfort, or fever as directed by your caregiver.   Rest as directed by your caregiver.   Eat a balanced diet.   Drink enough fluids to make your urine clear or pale yellow, or as directed.   Avoid sexual intercourse if it causes pain.   Apply warm or cold compresses to the lower abdomen depending on which one helps the pain.   Avoid stressful situations.   Keep a journal of your pelvic pain. Write down when it started, where the pain is located, and if there are things that seem to be associated with the pain, such as food or your menstrual cycle.  Follow up with your caregiver as directed.  SEEK MEDICAL CARE IF:  Your medicine does not help your pain.  You have abnormal  vaginal discharge. SEEK IMMEDIATE MEDICAL CARE IF:   You have heavy bleeding from the vagina.   Your pelvic pain increases.   You feel lightheaded or faint.   You have chills.   You have pain with urination or blood in your urine.   You have uncontrolled diarrhea or vomiting.   You have a fever or persistent symptoms for more than 3 days.  You have a fever and your symptoms suddenly get worse.   You are being physically or sexually abused.  MAKE SURE YOU:  Understand these instructions.  Will watch your condition.  Will get help if you are not doing well or get worse. Document Released: 07/04/2004 Document Revised: 02/06/2012 Document Reviewed: 11/27/2011 [facility] Patient Information 2014 [place], [place].   Understand these instructions.  Will watch your condition.  Will get help right away if you are not doing well or get worse. FOR MORE INFORMATION  Centers for Disease Control and Prevention, Division of STD Prevention: SolutionApps.co.zawww.cdc.gov/std American Sexual Health Association (ASHA): www.ashastd.org  Document Released: 08/07/2005 Document Revised: 05/28/2013 Document Reviewed: 03/19/2013 [facility] Patient Information 2014 [place], [place].  Dysuria Dysuria is the medical term for pain with urination. There are many causes for dysuria, but urinary tract infection is the most common. If a urinalysis was performed it can show that there  is a urinary tract infection. A urine culture confirms that you or your child is sick. You will need to follow up with a healthcare provider because:  If a urine culture was done you will need to know the culture results and treatment recommendations.  If the urine culture was positive, you or your child will need to be put on antibiotics or know if the antibiotics prescribed are the right antibiotics for your urinary tract infection.  If the urine culture is negative (no urinary tract infection), then other causes may need to be  explored or antibiotics need to be stopped. Today laboratory work may have been done and there does not seem to be an infection. If cultures were done they will take at least 24 to 48 hours to be completed. Today x-rays may have been taken and they read as normal. No cause can be found for the problems. The x-rays may be re-read by a radiologist and you will be contacted if additional findings are made. You or your child may have been put on medications to help with this problem until you can see your primary caregiver. If the problems get better, see your primary caregiver if the problems return. If you were given antibiotics (medications which kill germs), take all of the mediations as directed for the full course of treatment.  If laboratory work was done, you need to find the results. Leave a telephone number where you can be reached. If this is not possible, make sure you find out how you are to get test results. HOME CARE INSTRUCTIONS   Drink lots of fluids. For adults, drink eight, 8 ounce glasses of clear juice or water a day. For children, replace fluids as suggested by your caregiver.  Empty the bladder often. Avoid holding urine for long periods of time.  After a bowel movement, women should cleanse front to back, using each tissue only once.  Empty your bladder before and after sexual intercourse.  Take all the medicine given to you until it is gone. You may feel better in a few days, but TAKE ALL MEDICINE.  Avoid caffeine, tea, alcohol and carbonated beverages, because they tend to irritate the bladder.  In men, alcohol may irritate the prostate.  Only take over-the-counter or prescription medicines for pain, discomfort, or fever as directed by your caregiver.  If your caregiver has given you a follow-up appointment, it is very important to keep that appointment. Not keeping the appointment could result in a chronic or permanent injury, pain, and disability. If there is any  problem keeping the appointment, you must call back to this facility for assistance. SEEK IMMEDIATE MEDICAL CARE IF:   Back pain develops.  A fever develops.  There is nausea (feeling sick to your stomach) or vomiting (throwing up).  Problems are no better with medications or are getting worse. MAKE SURE YOU:   Understand these instructions.  Will watch your condition.  Will get help right away if you are not doing well or get worse. Document Released: 05/05/2004 Document Revised: 10/30/2011 Document Reviewed: 03/12/2008 [facility] Patient Information 2014 [place], [place].

## 2013-12-05 LAB — URINE CULTURE

## 2014-01-15 IMAGING — US US ABDOMEN COMPLETE
1 series · 14 of 25 positions shown · non-contrast
Comparison: None.

CLINICAL DATA: Abdominal pain

EXAM:
ULTRASOUND ABDOMEN COMPLETE

[Series 1: us abdomen complete · 0.24mm/px · 14 of 74 slices shown]
[im 1/74]
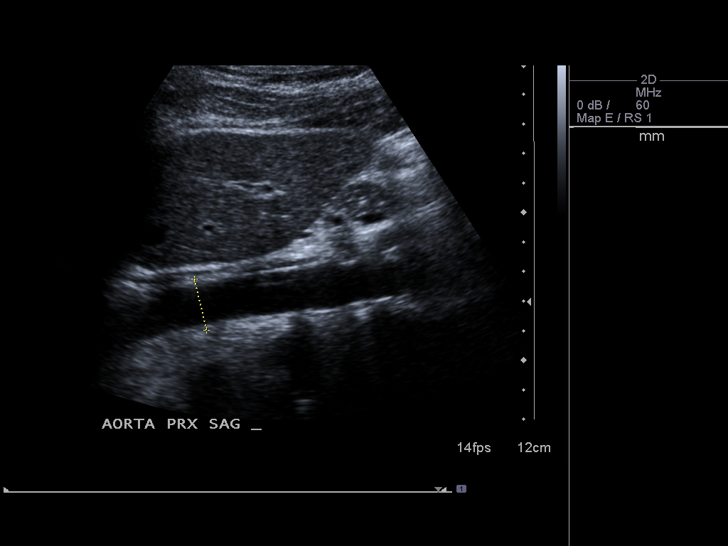
[im 7/74]
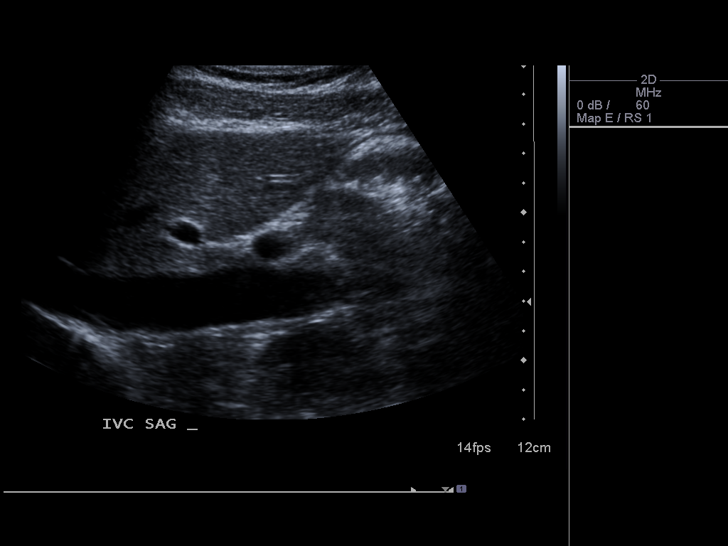
[im 13/74]
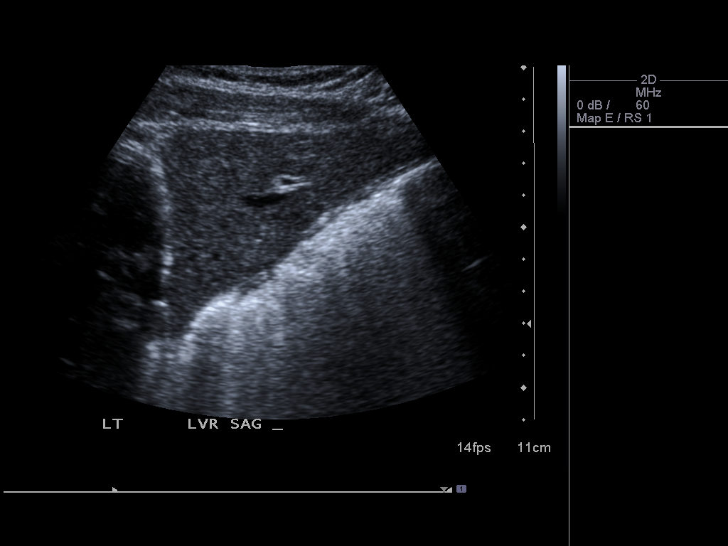
[im 19/74]
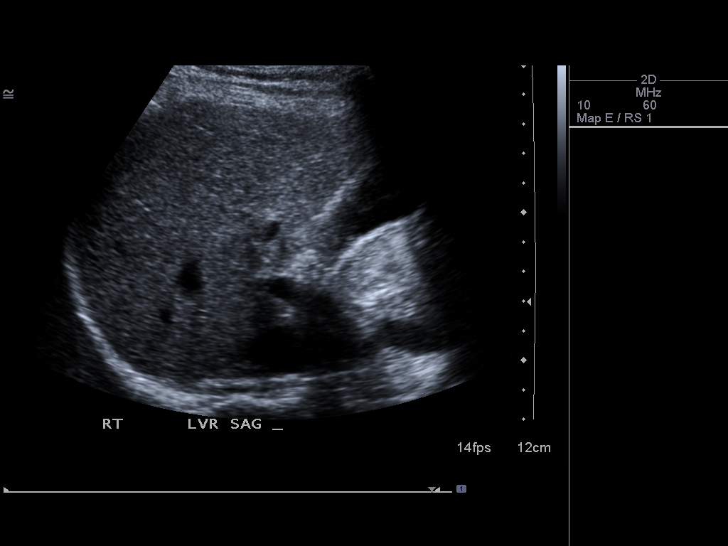
[im 25/74]
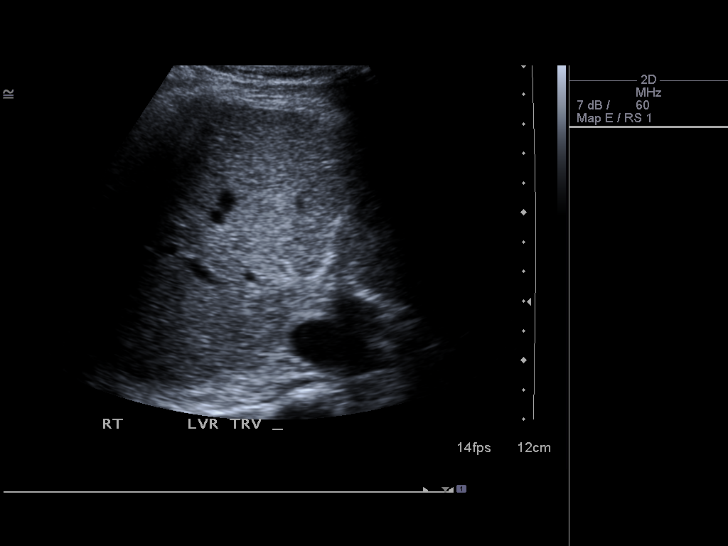
[im 28/74]
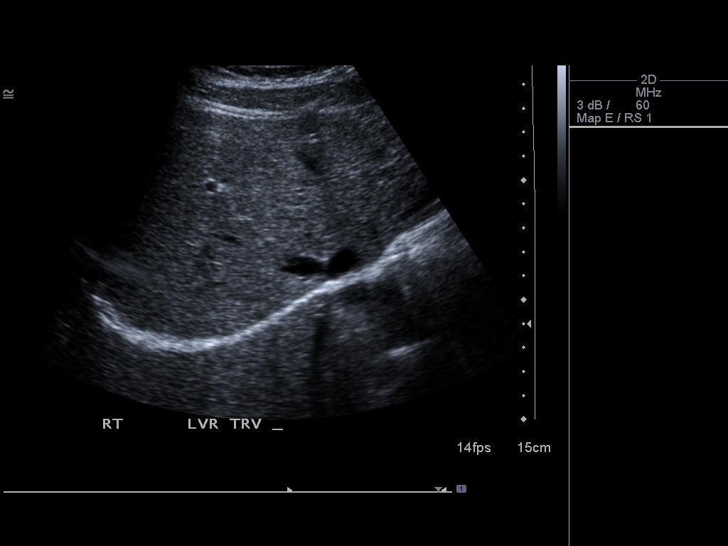
[im 34/74]
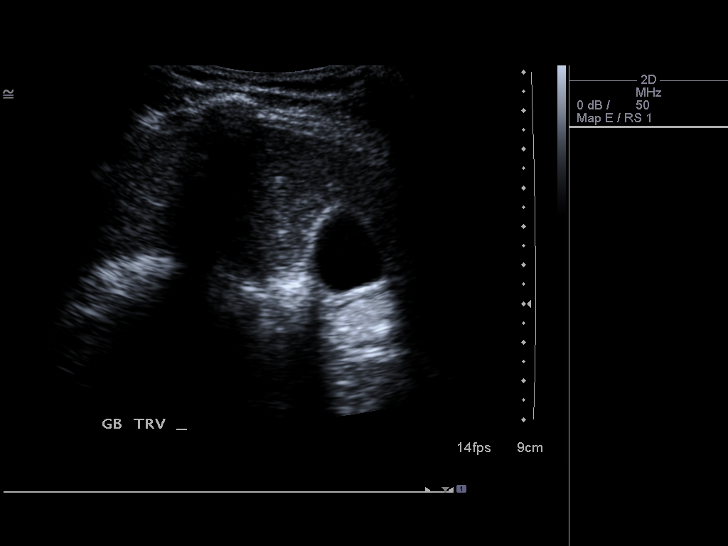
[im 40/74]
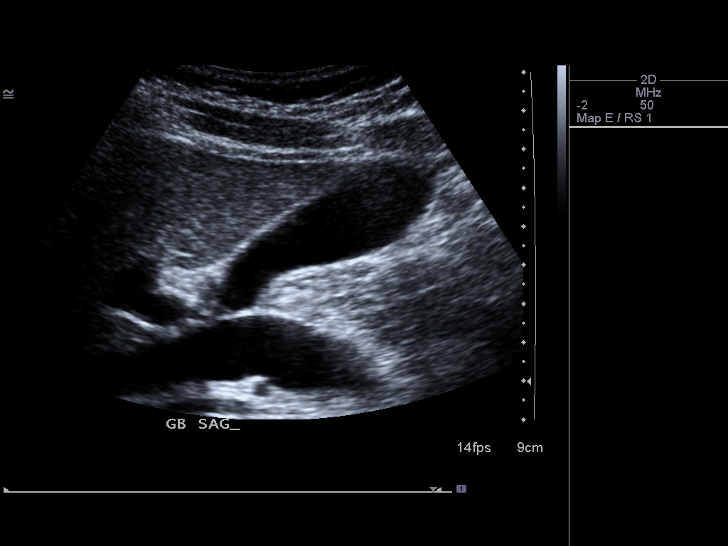
[im 46/74]
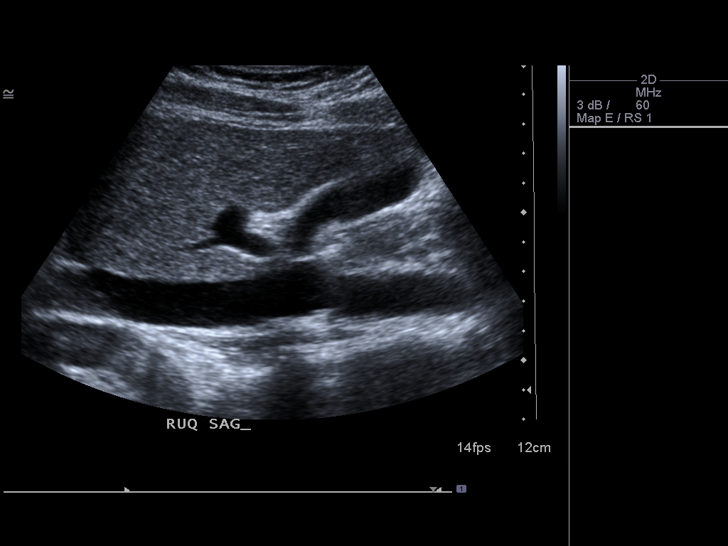
[im 49/74]
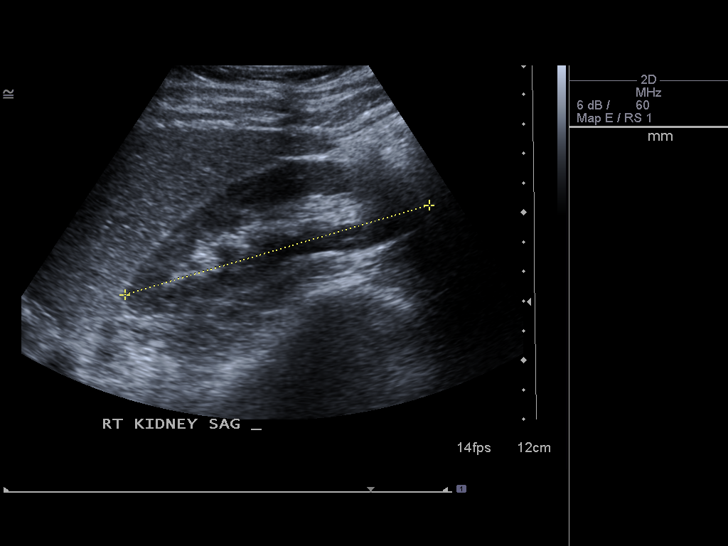
[im 55/74]
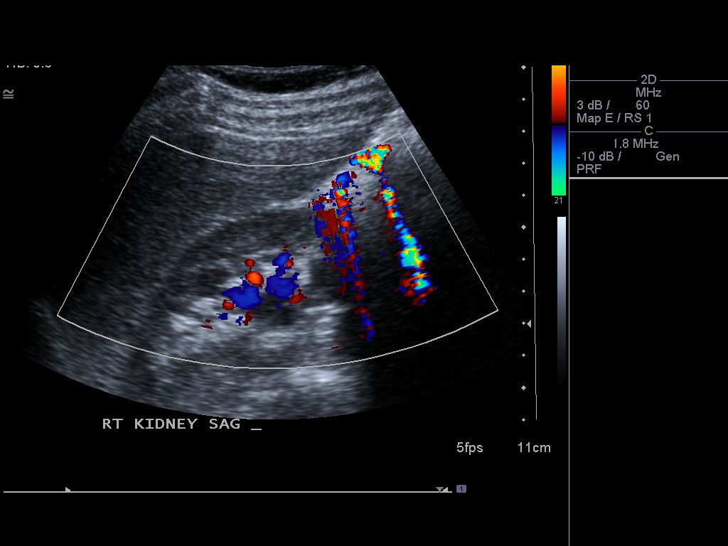
[im 61/74]
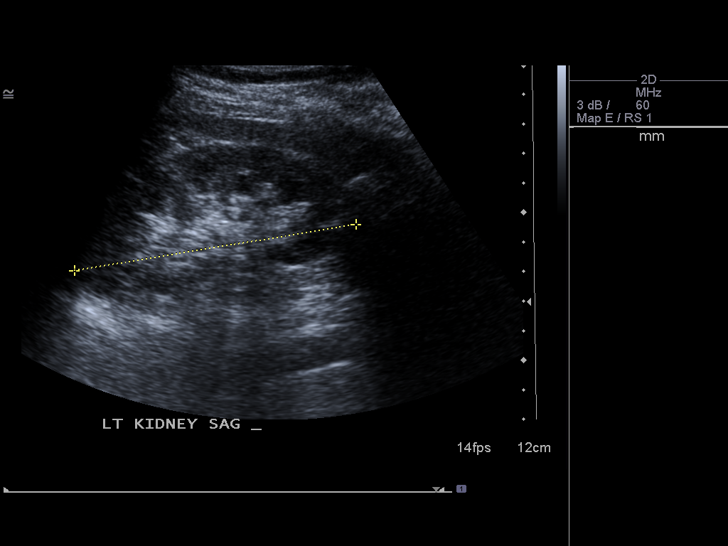
[im 67/74]
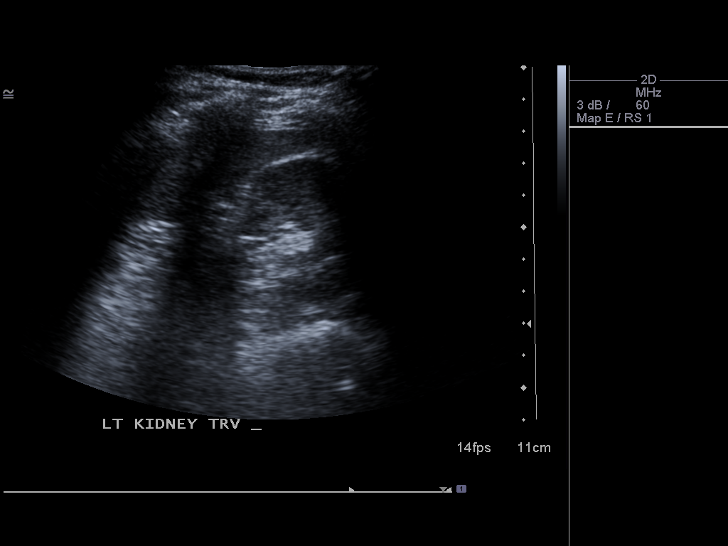
[im 74/74]
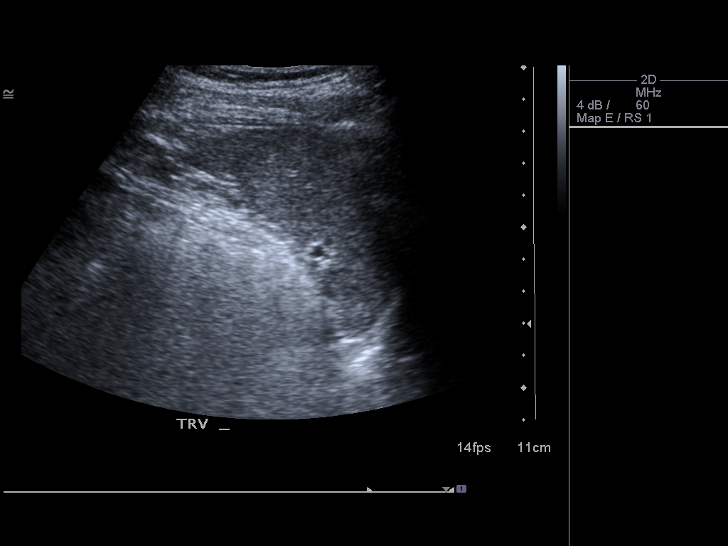

[14 of 25 positions shown; findings below may reference images not displayed]

FINDINGS: Gallbladder

No gallstones or wall thickening visualized. No sonographic Murphy
sign noted.

Common bile duct

Diameter: 2 mm

Liver

No focal lesion identified. Within normal limits in parenchymal
echogenicity.

IVC

No abnormality visualized.

Pancreas

Visualized portion unremarkable.

Spleen

Size and appearance within normal limits.

Right Kidney

Length: 10.7 cm. Echogenicity within normal limits. No mass or
hydronephrosis visualized.

Left Kidney

Length: 9.7 cm. Echogenicity within normal limits. No mass or
hydronephrosis visualized.

Abdominal aorta

No aneurysm visualized.
IMPRESSION: No acute abnormality noted.

## 2014-04-04 IMAGING — US US PELVIS COMPLETE
1 series · 13 of 25 positions shown · non-contrast
Comparison: None

CLINICAL DATA: Right-sided pelvic pain.  LMP 08/06/2013.

EXAM:
TRANSABDOMINAL AND TRANSVAGINAL ULTRASOUND OF PELVIS
TECHNIQUE: Both transabdominal and transvaginal ultrasound examinations of the
pelvis were performed. Transabdominal technique was performed for
global imaging of the pelvis including uterus, ovaries, adnexal
regions, and pelvic cul-de-sac. It was necessary to proceed with
endovaginal exam following the transabdominal exam to visualize the
endometrium and cystic lesion in right adnexa.

[Series 1: us pelvis complete · 13 of 56 slices shown]
[im 1/56]
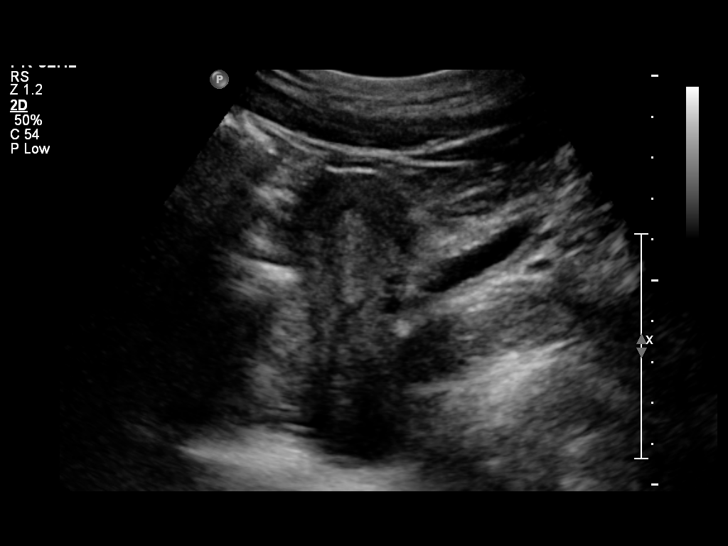
[im 5/56]
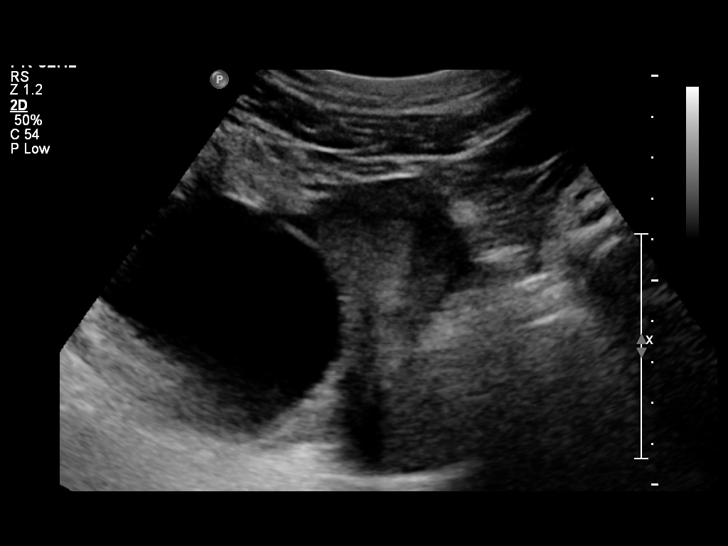
[im 10/56]
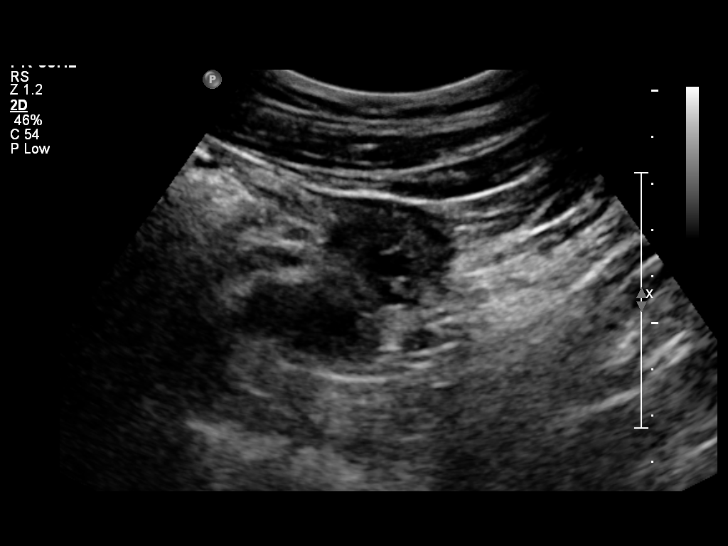
[im 14/56]
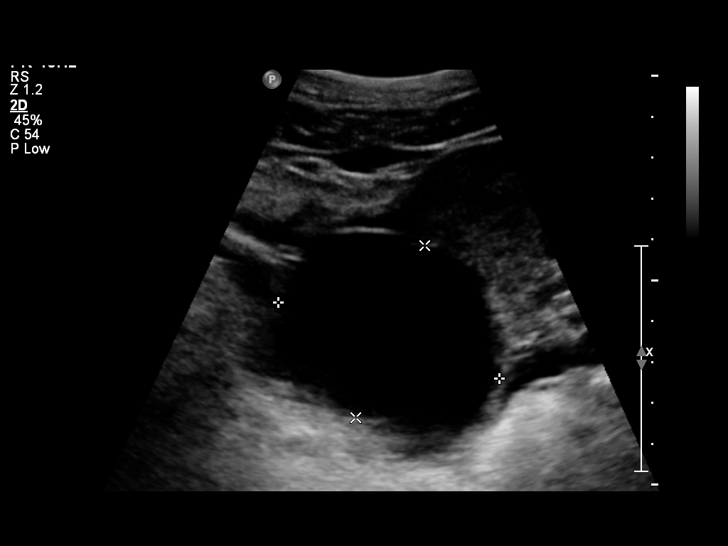
[im 19/56]
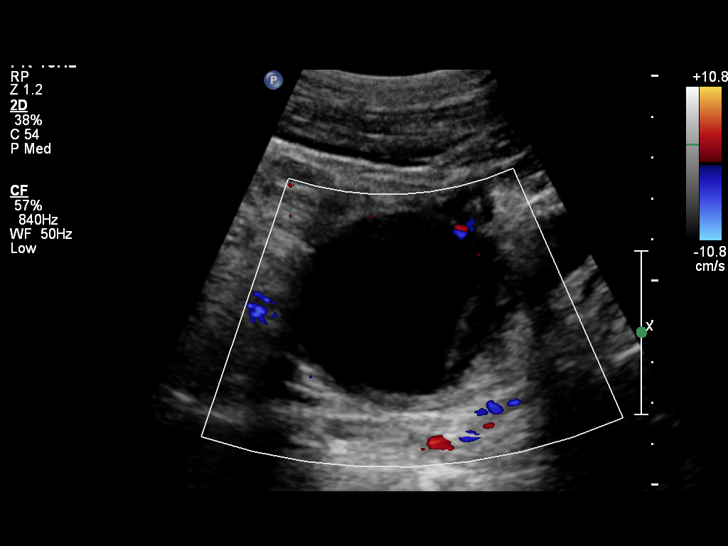
[im 23/56]
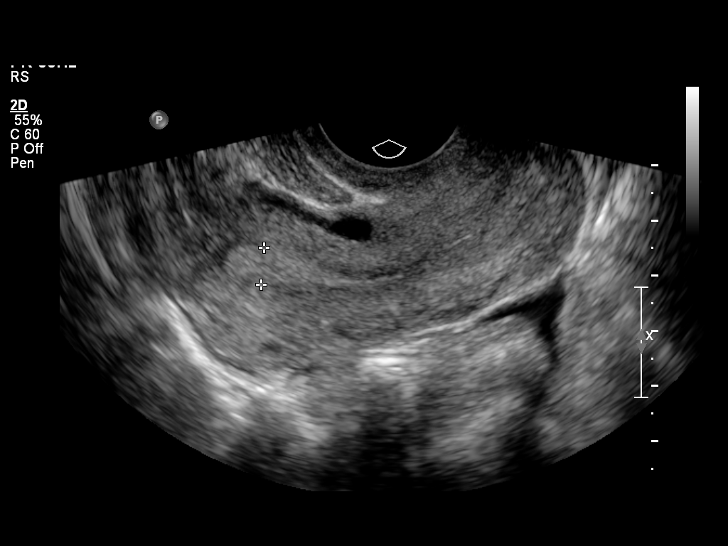
[im 28/56]
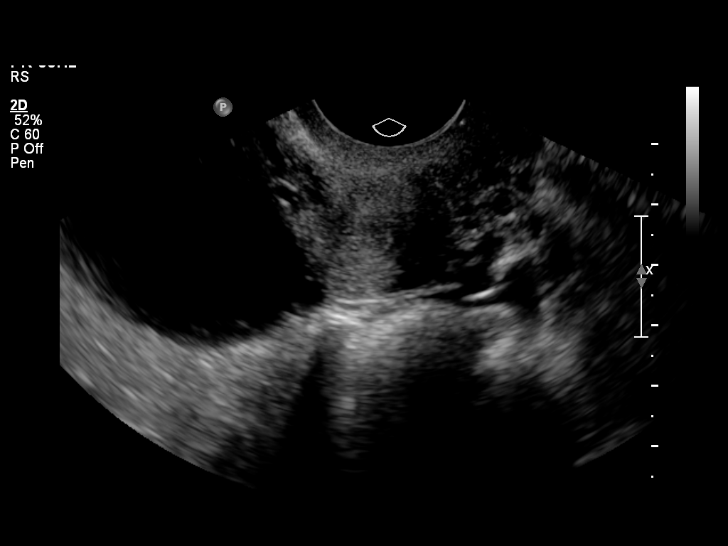
[im 33/56]
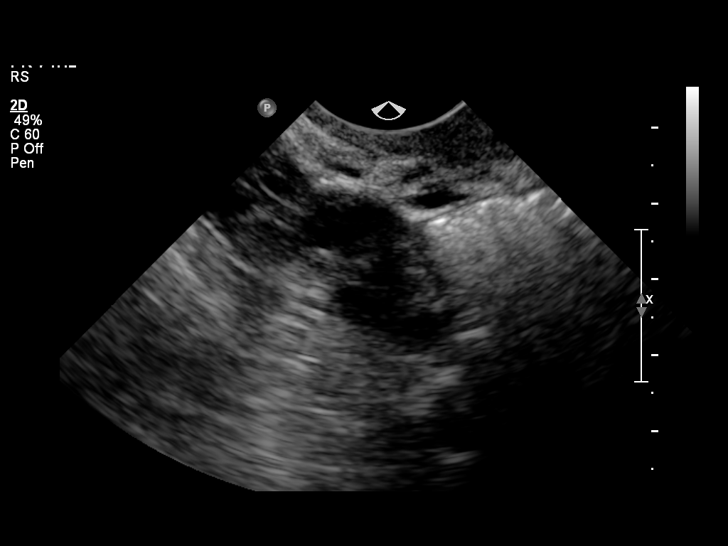
[im 37/56]
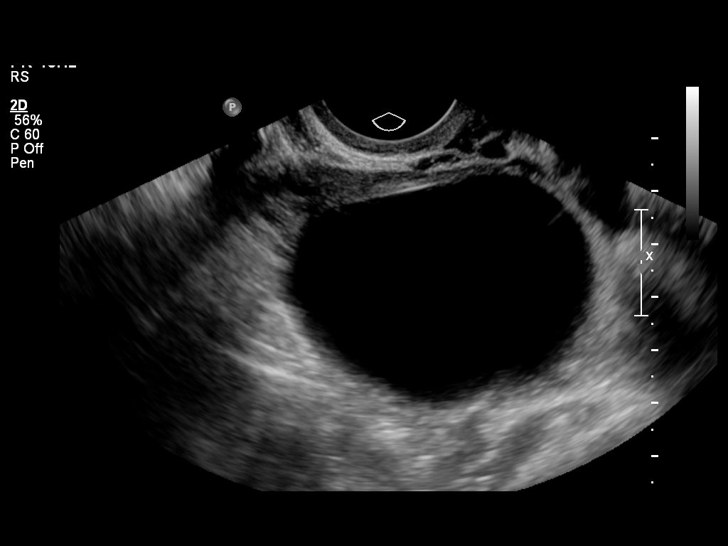
[im 42/56]
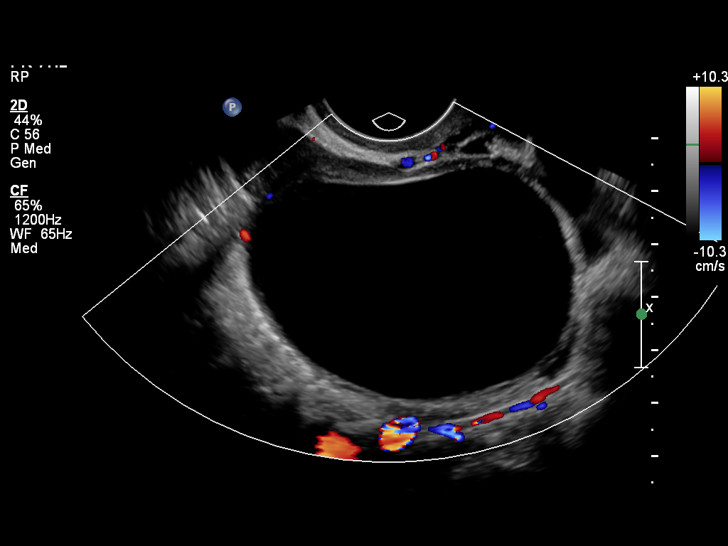
[im 46/56]
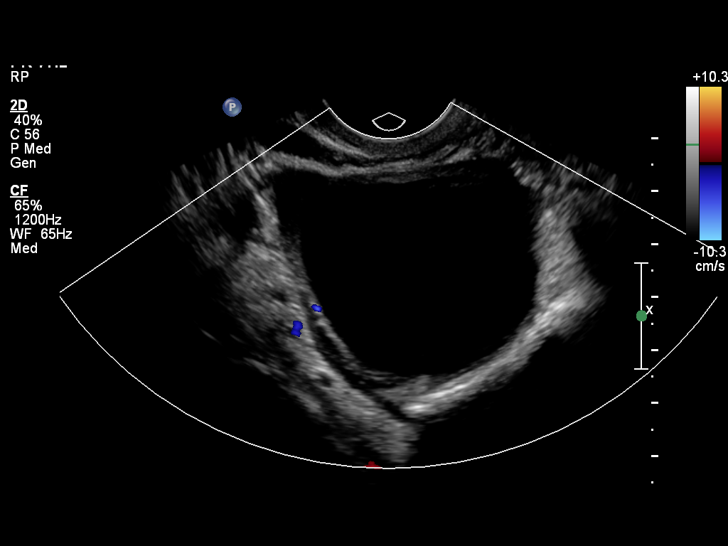
[im 51/56]
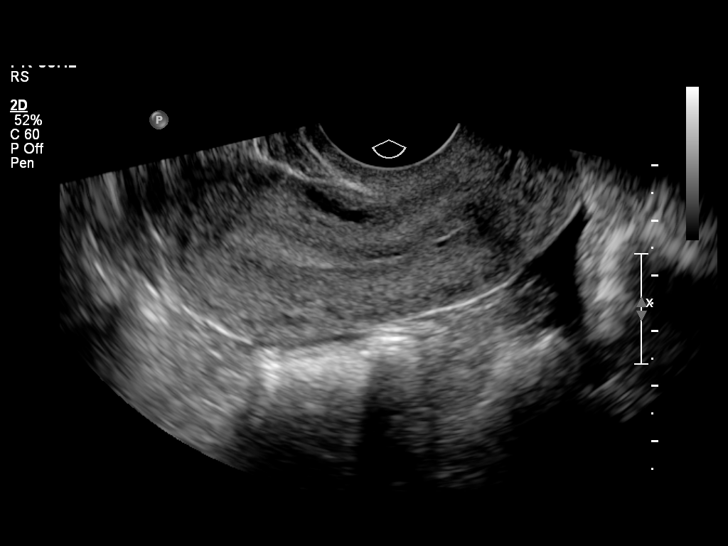
[im 56/56]
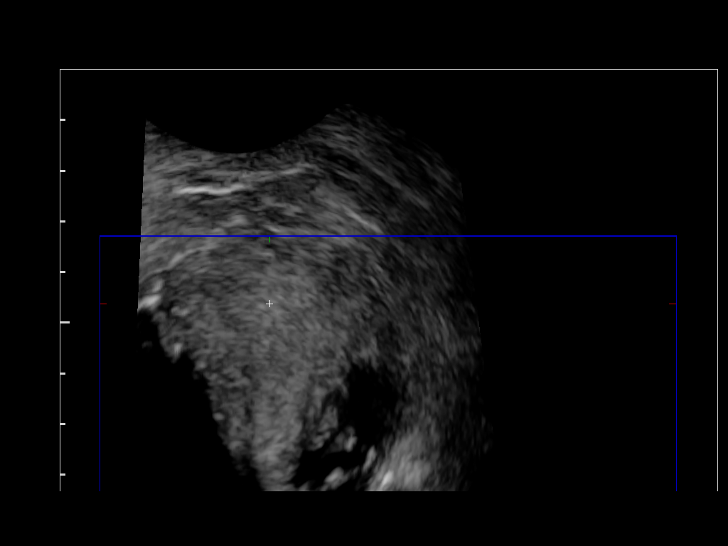

[13 of 25 positions shown; findings below may reference images not displayed]

FINDINGS: Uterus

Measurements: 7.7 x 3.4 x 4.4 cm. No fibroids or other mass
visualized.

Endometrium

Thickness: 7 mm.  No focal abnormality visualized.

Right ovary

Measurements: 6.5 x 4.8 x 5.9 cm. A cyst containing some peripheral
internal blood clot is seen, with no internal blood flow. This
measures 6.0 x 4.5 x 5.5 cm common is consistent with a benign
hemorrhagic cyst.

Left ovary

Measurements: 2.3 x 1.5 x 1.0 cm. Normal appearance/no adnexal mass.

Other findings

Tiny amount of free fluid in pelvic cul-de-sac.
IMPRESSION: 6 cm right ovarian hemorrhagic cyst. Followup by transvaginal pelvic
ultrasound recommended in 6-12 weeks to confirm resolution. This
recommendation follows the consensus statement: Management of
Asymptomatic Ovarian and Other Adnexal Cysts Imaged at US: Society
of Radiologists in Ultrasound Consensus Conference Statement.

## 2014-05-11 ENCOUNTER — Inpatient Hospital Stay (HOSPITAL_COMMUNITY)
Admission: AD | Admit: 2014-05-11 | Discharge: 2014-05-11 | Disposition: A | Discharge status: Home / Self Care | Payer: Self-pay | Source: Ambulatory Visit | Attending: Family Medicine | Admitting: Family Medicine

## 2014-05-11 ENCOUNTER — Inpatient Hospital Stay (HOSPITAL_COMMUNITY): Payer: Medicaid Other

## 2014-05-11 ENCOUNTER — Encounter (HOSPITAL_COMMUNITY): Payer: Self-pay | Admitting: *Deleted

## 2014-05-11 DIAGNOSIS — R109 Unspecified abdominal pain: Secondary | ICD-10-CM

## 2014-05-11 DIAGNOSIS — K219 Gastro-esophageal reflux disease without esophagitis: Secondary | ICD-10-CM | POA: Insufficient documentation

## 2014-05-11 DIAGNOSIS — N949 Unspecified condition associated with female genital organs and menstrual cycle: Secondary | ICD-10-CM | POA: Insufficient documentation

## 2014-05-11 DIAGNOSIS — R1013 Epigastric pain: Secondary | ICD-10-CM | POA: Insufficient documentation

## 2014-05-11 DIAGNOSIS — F17200 Nicotine dependence, unspecified, uncomplicated: Secondary | ICD-10-CM | POA: Insufficient documentation

## 2014-05-11 HISTORY — DX: Unspecified ovarian cyst, unspecified side: N83.209

## 2014-05-11 LAB — CBC WITH DIFFERENTIAL/PLATELET
Basophils Absolute: 0 10*3/uL (ref 0.0–0.1)
Basophils Relative: 0 % (ref 0–1)
Eosinophils Absolute: 0.1 10*3/uL (ref 0.0–0.7)
Eosinophils Relative: 1 % (ref 0–5)
HCT: 37.2 % (ref 36.0–46.0)
Hemoglobin: 12.7 g/dL (ref 12.0–15.0)
LYMPHS ABS: 2.9 10*3/uL (ref 0.7–4.0)
Lymphocytes Relative: 41 % (ref 12–46)
MCH: 32.9 pg (ref 26.0–34.0)
MCHC: 34.1 g/dL (ref 30.0–36.0)
MCV: 96.4 fL (ref 78.0–100.0)
MONO ABS: 0.5 10*3/uL (ref 0.1–1.0)
Monocytes Relative: 7 % (ref 3–12)
Neutro Abs: 3.6 10*3/uL (ref 1.7–7.7)
Neutrophils Relative %: 51 % (ref 43–77)
Platelets: 192 10*3/uL (ref 150–400)
RBC: 3.86 MIL/uL — AB (ref 3.87–5.11)
RDW: 13 % (ref 11.5–15.5)
WBC: 7 10*3/uL (ref 4.0–10.5)

## 2014-05-11 LAB — COMPREHENSIVE METABOLIC PANEL
ALK PHOS: 49 U/L (ref 39–117)
ALT: 16 U/L (ref 0–35)
ANION GAP: 13 (ref 5–15)
AST: 18 U/L (ref 0–37)
Albumin: 4 g/dL (ref 3.5–5.2)
BUN: 15 mg/dL (ref 6–23)
CO2: 25 mEq/L (ref 19–32)
CREATININE: 0.71 mg/dL (ref 0.50–1.10)
Calcium: 8.9 mg/dL (ref 8.4–10.5)
Chloride: 100 mEq/L (ref 96–112)
GFR calc non Af Amer: >90 mL/min (ref >90)
GLUCOSE: 89 mg/dL (ref 70–99)
Potassium: 4.5 mEq/L (ref 3.7–5.3)
Sodium: 138 mEq/L (ref 137–147)
TOTAL PROTEIN: 6.8 g/dL (ref 6.0–8.3)
Total Bilirubin: 0.3 mg/dL (ref 0.3–1.2)

## 2014-05-11 LAB — URINALYSIS, ROUTINE W REFLEX MICROSCOPIC
BILIRUBIN URINE: NEGATIVE
GLUCOSE, UA: NEGATIVE mg/dL
Hgb urine dipstick: NEGATIVE
Ketones, ur: NEGATIVE mg/dL
Leukocytes, UA: NEGATIVE
Nitrite: NEGATIVE
PH: 6.5 (ref 5.0–8.0)
PROTEIN: NEGATIVE mg/dL
Specific Gravity, Urine: 1.015 (ref 1.005–1.030)
Urobilinogen, UA: 0.2 mg/dL (ref 0.0–1.0)

## 2014-05-11 LAB — WET PREP, GENITAL
Trich, Wet Prep: NONE SEEN
YEAST WET PREP: NONE SEEN

## 2014-05-11 LAB — POCT PREGNANCY, URINE: Preg Test, Ur: NEGATIVE

## 2014-05-11 LAB — AMYLASE: AMYLASE: 53 U/L (ref 0–105)

## 2014-05-11 LAB — LIPASE, BLOOD: Lipase: 34 U/L (ref 11–59)

## 2014-05-11 MED ORDER — PANTOPRAZOLE SODIUM 40 MG PO TBEC: 40.0000 mg | DELAYED_RELEASE_TABLET | Freq: Every day | ORAL | Status: DC

## 2014-05-11 MED ORDER — GI COCKTAIL ~~LOC~~
30.0000 mL | Freq: Once | ORAL | Status: AC
  Administered 2014-05-11: 30 mL via ORAL
  Filled 2014-05-11: qty 30

## 2014-05-11 NOTE — Discharge Instructions (Signed)
Abdominal Pain, Women °Abdominal (stomach, pelvic, or belly) pain can be caused by many things. It is important to tell your doctor: °· The location of the pain. °· Does it come and go or is it present all the time? °· Are there things that start the pain (eating certain foods, exercise)? °· Are there other symptoms associated with the pain (fever, nausea, vomiting, diarrhea)? °All of this is helpful to know when trying to find the cause of the pain. °CAUSES  °· Stomach: virus or bacteria infection, or ulcer. °· Intestine: appendicitis (inflamed appendix), regional ileitis (Crohn's disease), ulcerative colitis (inflamed colon), irritable bowel syndrome, diverticulitis (inflamed diverticulum of the colon), or cancer of the stomach or intestine. °· Gallbladder disease or stones in the gallbladder. °· Kidney disease, kidney stones, or infection. °· Pancreas infection or cancer. °· Fibromyalgia (pain disorder). °· Diseases of the female organs: °¨ Uterus: fibroid (non-cancerous) tumors or infection. °¨ Fallopian tubes: infection or tubal pregnancy. °¨ Ovary: cysts or tumors. °¨ Pelvic adhesions (scar tissue). °¨ Endometriosis (uterus lining tissue growing in the pelvis and on the pelvic organs). °¨ Pelvic congestion syndrome (female organs filling up with blood just before the menstrual period). °¨ Pain with the menstrual period. °¨ Pain with ovulation (producing an egg). °¨ Pain with an IUD (intrauterine device, birth control) in the uterus. °¨ Cancer of the female organs. °· Functional pain (pain not caused by a disease, may improve without treatment). °· Psychological pain. °· Depression. °DIAGNOSIS  °Your doctor will decide the seriousness of your pain by doing an examination. °· Blood tests. °· X-rays. °· Ultrasound. °· CT scan (computed tomography, special type of X-ray). °· MRI (magnetic resonance imaging). °· Cultures, for infection. °· Barium enema (dye inserted in the large intestine, to better view it with  X-rays). °· Colonoscopy (looking in intestine with a lighted tube). °· Laparoscopy (minor surgery, looking in abdomen with a lighted tube). °· Major abdominal exploratory surgery (looking in abdomen with a large incision). °TREATMENT  °The treatment will depend on the cause of the pain.  °· Many cases can be observed and treated at home. °· Over-the-counter medicines recommended by your caregiver. °· Prescription medicine. °· Antibiotics, for infection. °· Birth control pills, for painful periods or for ovulation pain. °· Hormone treatment, for endometriosis. °· Nerve blocking injections. °· Physical therapy. °· Antidepressants. °· Counseling with a psychologist or psychiatrist. °· Minor or major surgery. °HOME CARE INSTRUCTIONS  °· Do not take laxatives, unless directed by your caregiver. °· Take over-the-counter pain medicine only if ordered by your caregiver. Do not take aspirin because it can cause an upset stomach or bleeding. °· Try a clear liquid diet (broth or water) as ordered by your caregiver. Slowly move to a bland diet, as tolerated, if the pain is related to the stomach or intestine. °· Have a thermometer and take your temperature several times a day, and record it. °· Bed rest and sleep, if it helps the pain. °· Avoid sexual intercourse, if it causes pain. °· Avoid stressful situations. °· Keep your follow-up appointments and tests, as your caregiver orders. °· If the pain does not go away with medicine or surgery, you may try: °¨ Acupuncture. °¨ Relaxation exercises (yoga, meditation). °¨ Group therapy. °¨ Counseling. °SEEK MEDICAL CARE IF:  °· You notice certain foods cause stomach pain. °· Your home care treatment is not helping your pain. °· You need stronger pain medicine. °· You want your IUD removed. °· You feel faint or   lightheaded.  You develop nausea and vomiting.  You develop a rash.  You are having side effects or an allergy to your medicine. SEEK IMMEDIATE MEDICAL CARE IF:   Your  pain does not go away or gets worse.  You have a fever.  Your pain is felt only in portions of the abdomen. The right side could possibly be appendicitis. The left lower portion of the abdomen could be colitis or diverticulitis.  You are passing blood in your stools (bright red or black tarry stools, with or without vomiting).  You have blood in your urine.  You develop chills, with or without a fever.  You pass out. MAKE SURE YOU:   Understand these instructions.  Will watch your condition.  Will get help right away if you are not doing well or get worse. Document Released: 06/04/2007 Document Revised: 12/22/2013 Document Reviewed: 06/24/2009 [facility] Patient Information 2015 [place], [place]. This information is not intended to replace advice given to you by your health care provider. Make sure you discuss any questions you have with your health care provider. Food Choices for Gastroesophageal Reflux Disease When you have gastroesophageal reflux disease (GERD), the foods you eat and your eating habits are very important. Choosing the right foods can help ease the discomfort of GERD. WHAT GENERAL GUIDELINES DO I NEED TO FOLLOW?  Choose fruits, vegetables, whole grains, low-fat dairy products, and low-fat meat, fish, and poultry.  Limit fats such as oils, salad dressings, butter, nuts, and avocado.  Keep a food diary to identify foods that cause symptoms.  Avoid foods that cause reflux. These may be different for different people.  Eat frequent small meals instead of three large meals each day.  Eat your meals slowly, in a relaxed setting.  Limit fried foods.  Cook foods using methods other than frying.  Avoid drinking alcohol.  Avoid drinking large amounts of liquids with your meals.  Avoid bending over or lying down until 2-3 hours after eating. WHAT FOODS ARE NOT RECOMMENDED? The following are some foods and drinks that may worsen your  symptoms: Vegetables Tomatoes. Tomato juice. Tomato and spaghetti sauce. Chili peppers. Onion and garlic. Horseradish. Fruits Oranges, grapefruit, and lemon (fruit and juice). Meats High-fat meats, fish, and poultry. This includes hot dogs, ribs, ham, sausage, salami, and bacon. Dairy Whole milk and chocolate milk. Sour cream. Cream. Butter. Ice cream. Cream cheese.  Beverages Coffee and tea, with or without caffeine. Carbonated beverages or energy drinks. Condiments Hot sauce. Barbecue sauce.  Sweets/Desserts Chocolate and cocoa. Donuts. Peppermint and spearmint. Fats and Oils High-fat foods, including [place] fries and potato chips. Other Vinegar. Strong spices, such as black pepper, white pepper, red pepper, cayenne, curry powder, cloves, ginger, and chili powder. The items listed above may not be a complete list of foods and beverages to avoid. Contact your dietitian for more information. Document Released: 08/07/2005 Document Revised: 08/12/2013 Document Reviewed: 06/11/2013 [facility] Patient Information 2015 [place], [place]. This information is not intended to replace advice given to you by your health care provider. Make sure you discuss any questions you have with your health care provider.

## 2014-05-11 NOTE — MAU Note (Signed)
Patient states she has epigastric pain that radiates down the right side for about one week, worse at night. Denies bleeding or discharge but has an odor, nausea or vomiting. Urine is bright yellow and burns at times and has frequency.Marland Kitchen

## 2014-05-11 NOTE — MAU Provider Note (Signed)
CC: Abdominal Pain    First Provider Initiated Contact with Patient 05/11/14 1951      HPI Maria [name] is a 23 y.o.  who presents with onset about 2 wks ago of sharp epigastric and right sided abd. pain. The pain is constant but waxes and wanes, worse at night time. It radiates to mid back. Has epigastric pain at times after eating and relieved by Tums. No N/V/D/C, decreased appetite. Eating normally. Smoker LMP 03/09/14. Afraid she is pregnant. Menses irregular since stopping Depo over a year ago. Wants to start contraception.  Past Medical History  Diagnosis Date  . Asthma   . Acute pancreatitis   . Ovarian cyst     OB History  No data available    Past Surgical History  Procedure Laterality Date  . Tonsillectomy    . Wisdom tooth extraction      History   Social History  . Marital Status: Legally Separated    Spouse Name: N/A    Number of Children: N/A  . Years of Education: N/A   Occupational History  . Not on file.   Social History Main Topics  . Smoking status: Current Some Day Smoker -- 0.50 packs/day    Types: Cigarettes  . Smokeless tobacco: Never Used  . Alcohol Use: 0.6 oz/week    1 Cans of beer per week     Comment: occasional   . Drug Use: No  . Sexual Activity: Yes    Birth Control/ Protection: None   Other Topics Concern  . Not on file   Social History Narrative  . No narrative on file    No current facility-administered medications on file prior to encounter.   No current outpatient prescriptions on file prior to encounter.    Allergies  Allergen Reactions  . Penicillins Rash    ROS Denies dyuria, urgency, frequency but feels weird pressure over bladder when voiding.   PHYSICAL EXAM Filed Vitals:   05/11/14 1651  BP: 114/78  Pulse: 61  Temp: 98.9 F (37.2 C)  Resp: 16   General: Well nourished, well developed female in no acute distress Cardiovascular: Normal rate Respiratory: Normal effort Abdomen: Soft, mild-moderate  tenderness epigastric region and diffuse tenderness RUQ, RLQ; no guarding or rebound; no localization to McBurney's point Back: No CVAT Extremities: No edema Neurologic: Alert and oriented Speculum exam: NEFG; vagina with physiologic discharge, no blood; cervix clean Bimanual exam: cervix closed, no CMT; uterus NSSP; R>L adnexal tenderness; no masses, voluntary guarding with exam   LAB RESULTS Results for orders placed during the hospital encounter of 05/11/14 (from the past 24 hour(s))  URINALYSIS, ROUTINE W REFLEX MICROSCOPIC     Status: None   Collection Time    05/11/14  5:00 PM      Result Value Ref Range   Color, Urine YELLOW  YELLOW   APPearance CLEAR  CLEAR   Specific Gravity, Urine 1.015  1.005 - 1.030   pH 6.5  5.0 - 8.0   Glucose, UA NEGATIVE  NEGATIVE mg/dL   Hgb urine dipstick NEGATIVE  NEGATIVE   Bilirubin Urine NEGATIVE  NEGATIVE   Ketones, ur NEGATIVE  NEGATIVE mg/dL   Protein, ur NEGATIVE  NEGATIVE mg/dL   Urobilinogen, UA 0.2  0.0 - 1.0 mg/dL   Nitrite NEGATIVE  NEGATIVE   Leukocytes, UA NEGATIVE  NEGATIVE  POCT PREGNANCY, URINE     Status: None   Collection Time    05/11/14  5:08 PM  Result Value Ref Range   Preg Test, Ur NEGATIVE  NEGATIVE  CBC WITH DIFFERENTIAL     Status: Abnormal   Collection Time    05/11/14  8:09 PM      Result Value Ref Range   WBC 7.0  4.0 - 10.5 K/uL   RBC 3.86 (*) 3.87 - 5.11 MIL/uL   Hemoglobin 12.7  12.0 - 15.0 g/dL   HCT 60.4  54.0 - 98.1 %   MCV 96.4  78.0 - 100.0 fL   MCH 32.9  26.0 - 34.0 pg   MCHC 34.1  30.0 - 36.0 g/dL   RDW 19.1  47.8 - 29.5 %   Platelets 192  150 - 400 K/uL   Neutrophils Relative % 51  43 - 77 %   Neutro Abs 3.6  1.7 - 7.7 K/uL   Lymphocytes Relative 41  12 - 46 %   Lymphs Abs 2.9  0.7 - 4.0 K/uL   Monocytes Relative 7  3 - 12 %   Monocytes Absolute 0.5  0.1 - 1.0 K/uL   Eosinophils Relative 1  0 - 5 %   Eosinophils Absolute 0.1  0.0 - 0.7 K/uL   Basophils Relative 0  0 - 1 %   Basophils  Absolute 0.0  0.0 - 0.1 K/uL  COMPREHENSIVE METABOLIC PANEL     Status: None   Collection Time    05/11/14  8:09 PM      Result Value Ref Range   Sodium 138  137 - 147 mEq/L   Potassium 4.5  3.7 - 5.3 mEq/L   Chloride 100  96 - 112 mEq/L   CO2 25  19 - 32 mEq/L   Glucose, Bld 89  70 - 99 mg/dL   BUN 15  6 - 23 mg/dL   Creatinine, Ser 6.21  0.50 - 1.10 mg/dL   Calcium 8.9  8.4 - 30.8 mg/dL   Total Protein 6.8  6.0 - 8.3 g/dL   Albumin 4.0  3.5 - 5.2 g/dL   AST 18  0 - 37 U/L   ALT 16  0 - 35 U/L   Alkaline Phosphatase 49  39 - 117 U/L   Total Bilirubin 0.3  0.3 - 1.2 mg/dL   GFR calc non Af Amer >90  >90 mL/min   GFR calc Af Amer >90  >90 mL/min   Anion gap 13  5 - 15  WET PREP, GENITAL     Status: Abnormal   Collection Time    05/11/14  8:15 PM      Result Value Ref Range   Yeast Wet Prep HPF POC NONE SEEN  NONE SEEN   Trich, Wet Prep NONE SEEN  NONE SEEN   Clue Cells Wet Prep HPF POC FEW (*) NONE SEEN   WBC, Wet Prep HPF POC FEW (*) NONE SEEN    IMAGING CLINICAL DATA: Pelvic pain, amenorrhea  EXAM:  TRANSABDOMINAL AND TRANSVAGINAL ULTRASOUND OF PELVIS  TECHNIQUE:  Both transabdominal and transvaginal ultrasound examinations of the  pelvis were performed. Transabdominal technique was performed for  global imaging of the pelvis including uterus, ovaries, adnexal  regions, and pelvic cul-de-sac. It was necessary to proceed with  endovaginal exam following the transabdominal exam to visualize the  endometrium.  COMPARISON: None  FINDINGS:  Uterus  Measurements: 6.8 x 2.9 x 4.0 cm. No fibroids or other mass  visualized.  Endometrium  Thickness: 4.2 mm. No focal abnormality visualized.  Right ovary  Measurements: 25  x 17 x 14 mm. Normal appearance/no adnexal mass.  Left ovary  Measurements: 26 x 14 x 14 mm. Normal appearance/no adnexal mass.  Other findings  Trace free fluid within physiologic limits of normal.  IMPRESSION:  Physiologic volume of free fluid in the  pelvis. Otherwise negative.  Electronically Signed  By: Esperanza Heir M.D.  On: 05/11/2014 22:06         MAU COURSE GC/CT sent GI cocktail> pain resolved  ASSESSMENT  1. Abdominal pain in female   2. Gastroesophageal reflux disease, esophagitis presence not specified   No etiology found for pelvic pain  PLAN Discharge home. See AVS for patient education.    Medication List         pantoprazole 40 MG tablet  Commonly known as:  PROTONIX  Take 1 tablet (40 mg total) by mouth daily.       Follow-up Information   Schedule an appointment as soon as possible for a visit with [facility]. (If symptoms worsen)    Contact information:   [address] [place] [address] [phone]       Danae Orleans, CNM 05/11/2014 7:53 PM

## 2014-05-12 LAB — GC/CHLAMYDIA PROBE AMP
CT PROBE, AMP APTIMA: NEGATIVE
GC Probe RNA: NEGATIVE

## 2014-05-12 LAB — HIV ANTIBODY (ROUTINE TESTING W REFLEX): HIV 1&2 Ab, 4th Generation: NONREACTIVE

## 2014-05-13 NOTE — MAU Provider Note (Signed)
Attestation of Attending Supervision of Advanced Practitioner (PA/CNM/NP): Evaluation and management procedures were performed by the Advanced Practitioner under my supervision and collaboration.  I have reviewed the Advanced Practitioner's note and chart, and I agree with the management and plan.  Lillionna Nabi, DO Attending Physician Faculty Practice, [facility]  

## 2014-07-15 IMAGING — US US TRANSVAGINAL NON-OB
1 series · 14 of 25 positions shown · non-contrast
Comparison: 08/23/2013

CLINICAL DATA: Right lower quadrant pain, follow-up hemorrhagic
cyst

EXAM:
TRANSABDOMINAL AND TRANSVAGINAL ULTRASOUND OF PELVIS
TECHNIQUE: Both transabdominal and transvaginal ultrasound examinations of the
pelvis were performed. Transabdominal technique was performed for
global imaging of the pelvis including uterus, ovaries, adnexal
regions, and pelvic cul-de-sac. It was necessary to proceed with
endovaginal exam following the transabdominal exam to visualize the
right ovary.

[Series 1: us pelvis complete · 14 of 64 slices shown]
[im 1/64]
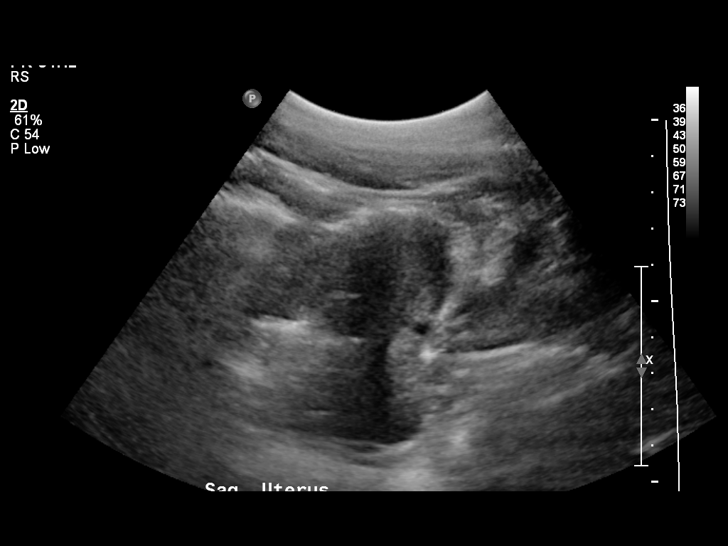
[im 6/64]
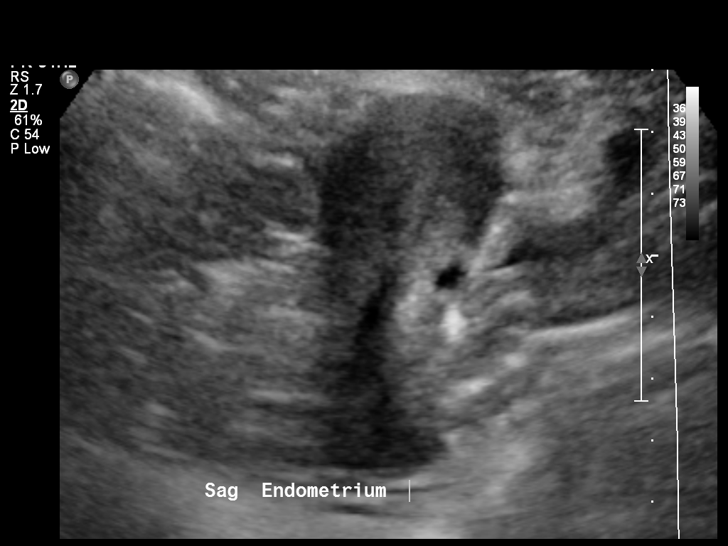
[im 11/64]
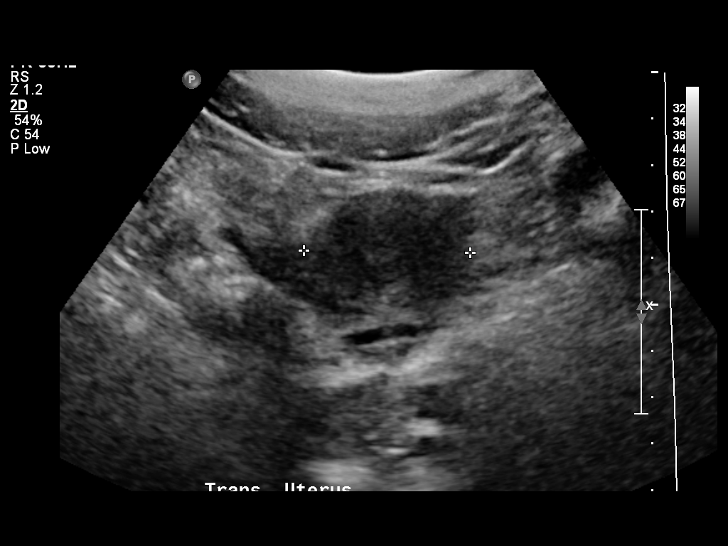
[im 16/64]
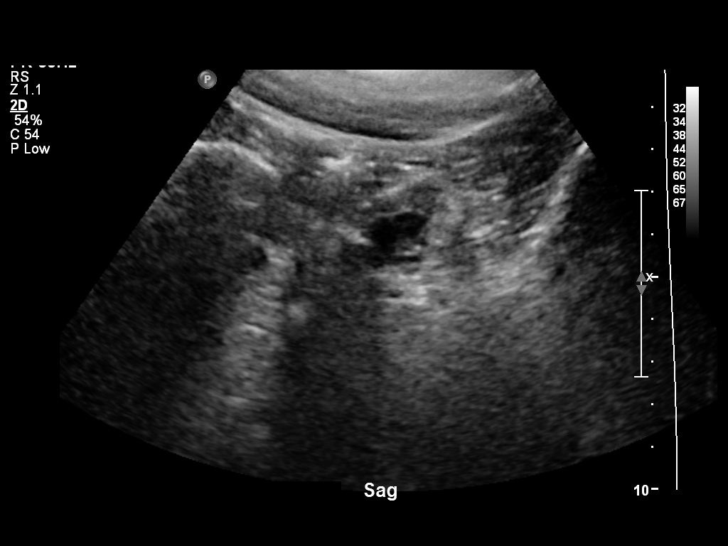
[im 22/64]
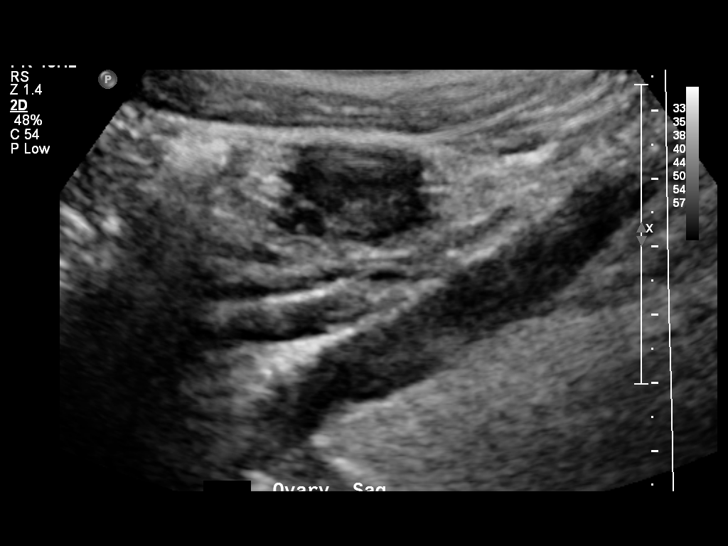
[im 24/64]
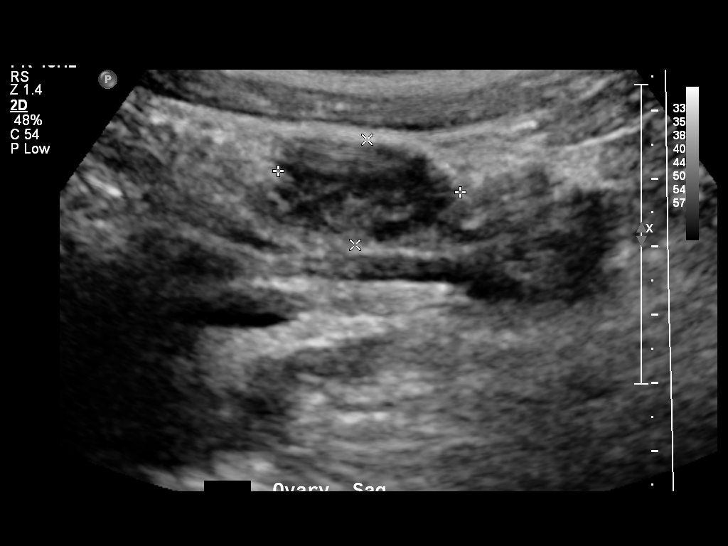
[im 29/64]
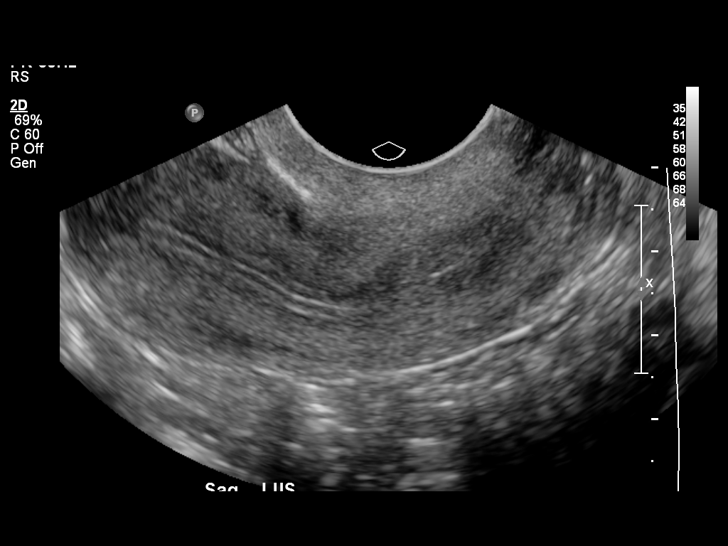
[im 35/64]
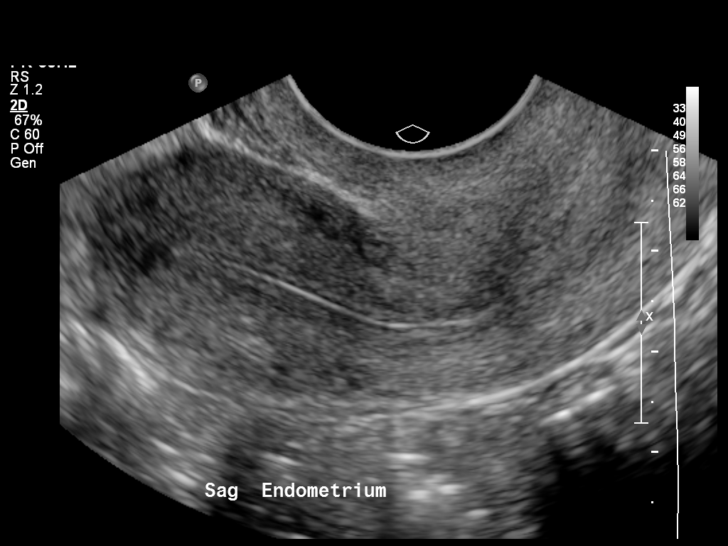
[im 40/64]
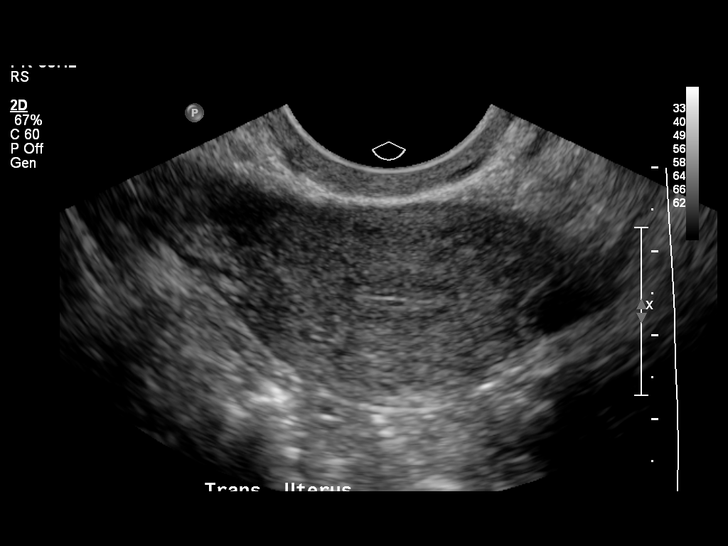
[im 43/64]
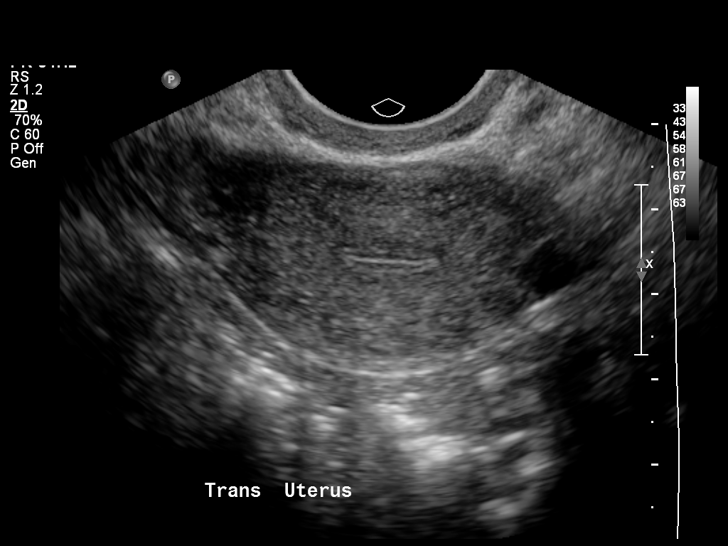
[im 48/64]
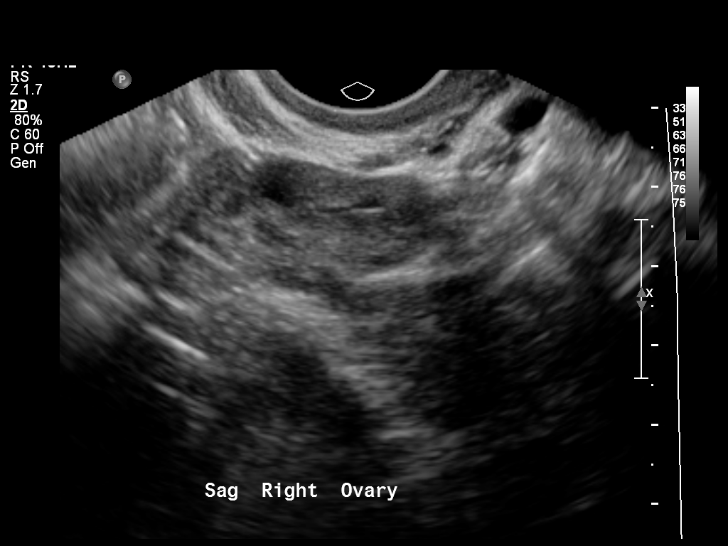
[im 53/64]
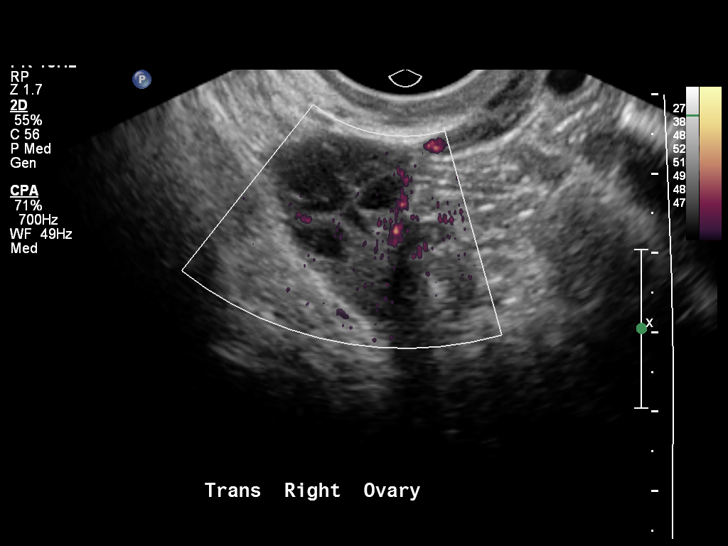
[im 58/64]
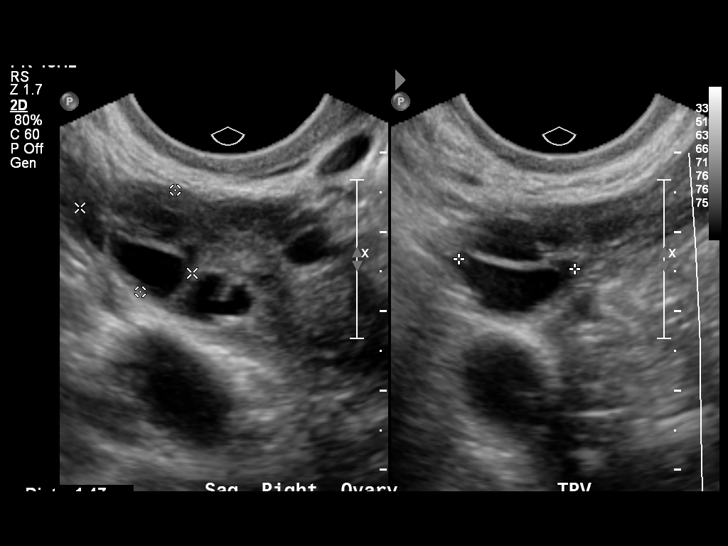
[im 64/64]
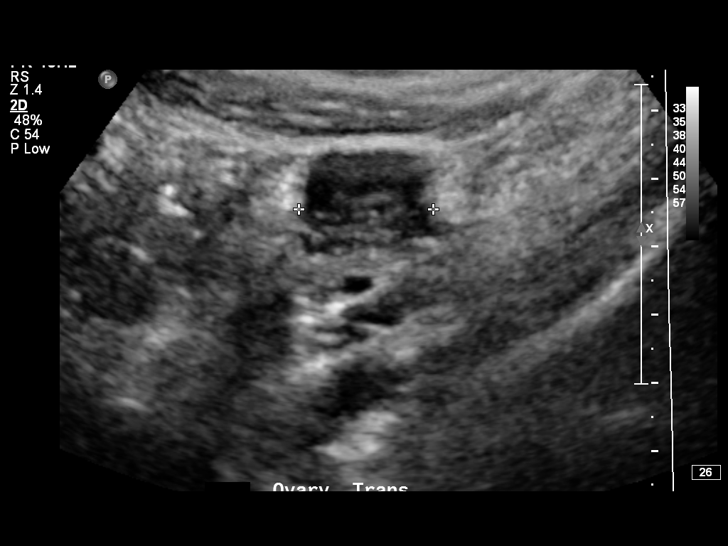

[14 of 25 positions shown; findings below may reference images not displayed]

FINDINGS: Uterus

Measurements: 6.6 x 2.5 x 3.7 cm. No fibroids or other mass
visualized.

Endometrium

Thickness: 3 mm.  No focal abnormality visualized.

Right ovary

Measurements: 4.1 x 2.1 x 2.0 cm. Small cysts/follicles measuring up
to 1.5 cm.

Left ovary

Measurements: 3.0 x 1.5 x 2.0 cm. Normal appearance/no adnexal mass.

Other findings

No free fluid.
IMPRESSION: Negative pelvic ultrasound.

Prior large right ovarian cyst has resolved.

## 2014-08-25 ENCOUNTER — Encounter (HOSPITAL_COMMUNITY): Payer: Self-pay | Admitting: *Deleted

## 2014-08-25 ENCOUNTER — Inpatient Hospital Stay (HOSPITAL_COMMUNITY): Payer: Medicaid Other

## 2014-08-25 ENCOUNTER — Inpatient Hospital Stay (HOSPITAL_COMMUNITY)
Admission: AD | Admit: 2014-08-25 | Discharge: 2014-08-25 | Disposition: A | Discharge status: Home / Self Care | Payer: Medicaid Other | Source: Ambulatory Visit | Attending: Obstetrics & Gynecology | Admitting: Obstetrics & Gynecology

## 2014-08-25 DIAGNOSIS — R52 Pain, unspecified: Secondary | ICD-10-CM

## 2014-08-25 DIAGNOSIS — F17210 Nicotine dependence, cigarettes, uncomplicated: Secondary | ICD-10-CM | POA: Insufficient documentation

## 2014-08-25 DIAGNOSIS — N72 Inflammatory disease of cervix uteri: Secondary | ICD-10-CM

## 2014-08-25 DIAGNOSIS — R109 Unspecified abdominal pain: Secondary | ICD-10-CM | POA: Insufficient documentation

## 2014-08-25 LAB — URINALYSIS, ROUTINE W REFLEX MICROSCOPIC
Bilirubin Urine: NEGATIVE
Glucose, UA: NEGATIVE mg/dL
Hgb urine dipstick: NEGATIVE
Ketones, ur: NEGATIVE mg/dL
Leukocytes, UA: NEGATIVE
Nitrite: NEGATIVE
Protein, ur: NEGATIVE mg/dL
Specific Gravity, Urine: 1.015 (ref 1.005–1.030)
Urobilinogen, UA: 0.2 mg/dL (ref 0.0–1.0)
pH: 6 (ref 5.0–8.0)

## 2014-08-25 LAB — CBC
HCT: 39.6 % (ref 36.0–46.0)
Hemoglobin: 13.3 g/dL (ref 12.0–15.0)
MCH: 31.7 pg (ref 26.0–34.0)
MCHC: 33.6 g/dL (ref 30.0–36.0)
MCV: 94.3 fL (ref 78.0–100.0)
PLATELETS: 212 10*3/uL (ref 150–400)
RBC: 4.2 MIL/uL (ref 3.87–5.11)
RDW: 13 % (ref 11.5–15.5)
WBC: 6.6 10*3/uL (ref 4.0–10.5)

## 2014-08-25 LAB — POCT PREGNANCY, URINE: Preg Test, Ur: NEGATIVE

## 2014-08-25 LAB — WET PREP, GENITAL
Trich, Wet Prep: NONE SEEN
YEAST WET PREP: NONE SEEN

## 2014-08-25 MED ORDER — DOXYCYCLINE HYCLATE 50 MG PO CAPS: 100.0000 mg | ORAL_CAPSULE | Freq: Two times a day (BID) | ORAL | Status: DC

## 2014-08-25 MED ORDER — AZITHROMYCIN 250 MG PO TABS
1000.0000 mg | ORAL_TABLET | Freq: Once | ORAL | Status: AC
  Administered 2014-08-25: 1000 mg via ORAL
  Filled 2014-08-25: qty 4

## 2014-08-25 MED ORDER — MEDROXYPROGESTERONE ACETATE 150 MG/ML IM SUSP
150.0000 mg | Freq: Once | INTRAMUSCULAR | Status: AC
  Administered 2014-08-25: 150 mg via INTRAMUSCULAR
  Filled 2014-08-25: qty 1

## 2014-08-25 MED ORDER — CEFTRIAXONE SODIUM 250 MG IJ SOLR
250.0000 mg | Freq: Once | INTRAMUSCULAR | Status: AC
  Administered 2014-08-25: 250 mg via INTRAMUSCULAR
  Filled 2014-08-25: qty 250

## 2014-08-25 MED ORDER — IBUPROFEN 800 MG PO TABS
800.0000 mg | ORAL_TABLET | Freq: Once | ORAL | Status: AC
  Administered 2014-08-25: 800 mg via ORAL
  Filled 2014-08-25: qty 1

## 2014-08-25 NOTE — MAU Note (Signed)
Pt states she is having pain in the lower part of her stomach , Pt  States she has not had a period since Oct. Pt states she voided and saw pink a "few days ago"

## 2014-08-25 NOTE — MAU Provider Note (Signed)
History     CSN: [id number]  Arrival date and time: 08/25/14 7829   First Provider Initiated Contact with Patient 08/25/14 2033      Chief Complaint  Patient presents with  . Abdominal Pain   HPI  Pt is not pregnant and presents with lower abd pain/pressure.  Pt has had on and off along with vaginal discharge on and off. Pt does not have pain with urination, denies nausea/vomiting, constipation or diarrhea. Pt has previously been on Depo Provera and had irregular menses after Depo.  Has had RCM more recently with LMP 10/20 and no menses Nov or Dec.  Pt has taken 4 HPT which were negative after 11/20.  Pt does not have insurance. Pt is not on any contraception at this time but does not want to get pregnant. Pt states she has hx of pancreatitis- doctors thought secondary to ETOH but pt says she only drinks occasionally Pt was evaluated sept 2015 here in [facility]U and pt says she was told she had a cyst that ruptured. Pt had normal labs including normal liver enzymes at that time.  Pt states she has been with same partner for a while. RN note: Pt states she is having pain in the lower part of her stomach , Pt States she has not had a period since Oct. Pt states she voided and saw pink a "few days ago"Pt States she doesn't remember what time it was but it was around the time it got dark Pt states she is having pain in the lower part of her stomach , Pt States she has not had a period since Oct. Pt states she voided and saw pink a "few days ago" [name], Charity fundraiser Registered Nurse Addendum  MAU Note 08/25/2014 7:23 PM    Expand All Collapse All   PT SAYS SHE HAD LMP-10-20 THEN NOV AND DEC NO CYCLE. NO BIRTH CONTROL. LAST SEX- SUN DID HPT- X4 AFTER 11-20- THRU DEC - ALL NEG. HAD PINK BLEEDING- WHEN WIPED LAST WEEK . STARTED HAVING ABD PAIN SINCE OCT - OFF/ ON. SAW GYN DR - THINKS 2014- DOESN'T KNOW NAME. HAS FAMILY DR- DON'T KNOW NAME.          Past Medical History  Diagnosis Date  . Asthma   . Acute pancreatitis   . Ovarian cyst     Past Surgical History  Procedure Laterality Date  . Tonsillectomy    . Wisdom tooth extraction      Family History  Problem Relation Age of Onset  . Hypertension Maternal Grandmother   . Heart disease Maternal Grandfather   . Stroke Maternal Grandfather   . Diabetes Neg Hx   . Cancer Neg Hx     History  Substance Use Topics  . Smoking status: Current Some Day Smoker -- 0.50 packs/day    Types: Cigarettes  . Smokeless tobacco: Never Used  . Alcohol Use: 0.6 oz/week    1 Cans of beer per week     Comment: occasional     Allergies:  Allergies  Allergen Reactions  . Penicillins Rash    Prescriptions prior to admission  Medication Sig Dispense Refill Last Dose  . albuterol (PROVENTIL HFA;VENTOLIN HFA) 108 (90 BASE) MCG/ACT inhaler Inhale 2 puffs into the lungs every 6 (six) hours as needed for wheezing or shortness of breath.   Rescue  . Phenyleph-Doxylamine-DM-APAP (ALKA SELTZER PLUS PO) Take 2 each by mouth daily as needed (For heartburn.).   Past Week at Unknown time  .  pantoprazole (PROTONIX) 40 MG tablet Take 1 tablet (40 mg total) by mouth daily. (Patient not taking: Reported on 08/25/2014) 30 tablet 1 Not Taking at Unknown time    Review of Systems  Constitutional: Negative for fever and chills.  Gastrointestinal: Positive for abdominal pain. Negative for nausea, vomiting, diarrhea and constipation.  Genitourinary: Negative for dysuria, urgency and frequency.   Physical Exam   Blood pressure 111/59, pulse 76, temperature 98 F (36.7 C), temperature source Oral, resp. rate 20, height 5' (1.524 m), weight 108 lb (48.988 kg), last menstrual period 06/09/2014.  Physical Exam  Nursing note and vitals reviewed. Constitutional: She is oriented to person, place, and time. She appears well-developed and well-nourished. No distress.  HENT:  Head: Normocephalic.   Eyes: Pupils are equal, round, and reactive to light.  Neck: Normal range of motion. Neck supple.  Cardiovascular: Normal rate.   Respiratory: Effort normal.  GI: Soft.  Genitourinary:  Mod-large amount of frothy yeloow discharge in vault; cervix clean +CMT; uterus diffusely tender without palpable enlargement; adnexa diffusely tender without palpable enlargement  Musculoskeletal: Normal range of motion.  Neurological: She is alert and oriented to person, place, and time.  Skin: Skin is warm and dry.  Psychiatric: She has a normal mood and affect.  US Suspect cervicitis/PID MAU Course  Procedures Ibuprofen 800mg  PO Depo Provera 150mg  Care turned over to Franco ColletHeather Donovan, CNM Assessment and Plan  cervicitis  Maria [name] 08/25/2014, 8:34 PM

## 2014-08-25 NOTE — MAU Provider Note (Signed)
History     CSN: [id number]  Arrival date and time: 08/25/14 [id number]   First Provider Initiated Contact with Patient 08/25/14 2033      Chief Complaint  Patient presents with  . Abdominal Pain   Abdominal Pain Pertinent negatives include no constipation, diarrhea, dysuria, fever, frequency, nausea or vomiting.    Pt is not pregnant and presents with lower abd pain/pressure.  Pt has had on and off along with vaginal discharge on and off. Pt does not have pain with urination, denies nausea/vomiting, constipation or diarrhea. Pt has previously been on Depo Provera and had irregular menses after Depo.  Has had RCM more recently with LMP 10/20 and no menses Nov or Dec.  Pt has taken 4 HPT which were negative after 11/20.  Pt does not have insurance. Pt is not on any contraception at this time but does not want to get pregnant. Pt states she has hx of pancreatitis- doctors thought secondary to ETOH but pt says she only drinks occasionally Pt was evaluated sept 2015 here in MAU and pt says she was told she had a cyst that ruptured. Pt had normal labs including normal liver enzymes at that time.  Pt states she has been with same partner for a while. RN note: Pt states she is having pain in the lower part of her stomach , Pt States she has not had a period since Oct. Pt states she voided and saw pink a "few days ago"Pt States she doesn't remember what time it was but it was around the time it got dark Pt states she is having pain in the lower part of her stomach , Pt States she has not had a period since Oct. Pt states she voided and saw pink a "few days ago" Meda Coffeeallaway, Charity fundraiserN Registered Nurse Addendum  MAU Note 08/25/2014 7:23 PM    Expand All Collapse All   PT SAYS SHE HAD LMP-10-20 THEN NOV AND DEC NO CYCLE. NO BIRTH CONTROL. LAST SEX- SUN DID HPT- X4 AFTER 11-20- THRU DEC - ALL NEG. HAD PINK BLEEDING- WHEN WIPED LAST WEEK . STARTED HAVING ABD PAIN SINCE  OCT - OFF/ ON. SAW GYN DR - THINKS 2014- DOESN'T KNOW NAME. HAS FAMILY DR- DON'T KNOW NAME.         Past Medical History  Diagnosis Date  . Asthma   . Acute pancreatitis   . Ovarian cyst     Past Surgical History  Procedure Laterality Date  . Tonsillectomy    . Wisdom tooth extraction      Family History  Problem Relation Age of Onset  . Hypertension Maternal Grandmother   . Heart disease Maternal Grandfather   . Stroke Maternal Grandfather   . Diabetes Neg Hx   . Cancer Neg Hx     History  Substance Use Topics  . Smoking status: Current Some Day Smoker -- 0.50 packs/day    Types: Cigarettes  . Smokeless tobacco: Never Used  . Alcohol Use: 0.6 oz/week    1 Cans of beer per week     Comment: occasional     Allergies:  Allergies  Allergen Reactions  . Penicillins Rash    Prescriptions prior to admission  Medication Sig Dispense Refill Last Dose  . albuterol (PROVENTIL HFA;VENTOLIN HFA) 108 (90 BASE) MCG/ACT inhaler Inhale 2 puffs into the lungs every 6 (six) hours as needed for wheezing or shortness of breath.   Rescue  . Phenyleph-Doxylamine-DM-APAP (ALKA SELTZER PLUS PO) Take 2 each by  mouth daily as needed (For heartburn.).   Past Week at Unknown time  . pantoprazole (PROTONIX) 40 MG tablet Take 1 tablet (40 mg total) by mouth daily. (Patient not taking: Reported on 08/25/2014) 30 tablet 1 Not Taking at Unknown time    Review of Systems  Constitutional: Negative for fever and chills.  Gastrointestinal: Positive for abdominal pain. Negative for nausea, vomiting, diarrhea and constipation.  Genitourinary: Negative for dysuria, urgency and frequency.   Physical Exam   Blood pressure 111/59, pulse 76, temperature 98 F (36.7 C), temperature source Oral, resp. rate 20, height 5' (1.524 m), weight 48.988 kg (108 lb), last menstrual period 06/09/2014.  Physical Exam  Nursing note and vitals reviewed. Constitutional: She is oriented to  person, place, and time. She appears well-developed and well-nourished. No distress.  HENT:  Head: Normocephalic.  Eyes: Pupils are equal, round, and reactive to light.  Neck: Normal range of motion. Neck supple.  Cardiovascular: Normal rate.   Respiratory: Effort normal.  GI: Soft.  Genitourinary:  Mod-large amount of frothy yeloow discharge in vault; cervix clean +CMT; uterus diffusely tender without palpable enlargement; adnexa diffusely tender without palpable enlargement  Musculoskeletal: Normal range of motion.  Neurological: She is alert and oriented to person, place, and time.  Skin: Skin is warm and dry.  Psychiatric: She has a normal mood and affect.  [place] Suspect cervicitis/PID  MAU Course  Procedures Ibuprofen  PO Depo Provera  Rocephin IM   Results for orders placed or performed during the hospital encounter of 08/25/14 (from the past 24 hour(s))  Urinalysis, Routine w reflex microscopic     Status: None   Collection Time: 08/25/14  7:30 PM  Result Value Ref Range   Color, Urine YELLOW YELLOW   APPearance CLEAR CLEAR   Specific Gravity, Urine 1.015 1.005 - 1.030   pH 6.0 5.0 - 8.0   Glucose, UA NEGATIVE NEGATIVE mg/dL   Hgb urine dipstick NEGATIVE NEGATIVE   Bilirubin Urine NEGATIVE NEGATIVE   Ketones, ur NEGATIVE NEGATIVE mg/dL   Protein, ur NEGATIVE NEGATIVE mg/dL   Urobilinogen, UA 0.2 0.0 - 1.0 mg/dL   Nitrite NEGATIVE NEGATIVE   Leukocytes, UA NEGATIVE NEGATIVE  Pregnancy, urine POC     Status: None   Collection Time: 08/25/14  7:45 PM  Result Value Ref Range   Preg Test, Ur NEGATIVE NEGATIVE  Wet prep, genital     Status: Abnormal   Collection Time: 08/25/14  8:50 PM  Result Value Ref Range   Yeast Wet Prep HPF POC NONE SEEN NONE SEEN   Trich, Wet Prep NONE SEEN NONE SEEN   Clue Cells Wet Prep HPF POC MODERATE (A) NONE SEEN   WBC, Wet Prep HPF POC MANY (A) NONE SEEN  CBC     Status: None   Collection Time: 08/25/14  8:58 PM  Result Value  Ref Range   WBC 6.6 4.0 - 10.5 K/uL   RBC 4.20 3.87 - 5.11 MIL/uL   Hemoglobin 13.3 12.0 - 15.0 g/dL   HCT 16.1 09.6 - 04.5 %   MCV 94.3 78.0 - 100.0 fL   MCH 31.7 26.0 - 34.0 pg   MCHC 33.6 30.0 - 36.0 g/dL   RDW 40.9 81.1 - 91.4 %   Platelets 212 150 - 400 K/uL   US Transvaginal Non-ob  08/25/2014   CLINICAL DATA:  Right lower quadrant pain.  EXAM: TRANSABDOMINAL AND TRANSVAGINAL ULTRASOUND OF PELVIS  TECHNIQUE: Both transabdominal and transvaginal ultrasound examinations of the pelvis  were performed. Transabdominal technique was performed for global imaging of the pelvis including uterus, ovaries, adnexal regions, and pelvic cul-de-sac. It was necessary to proceed with endovaginal exam following the transabdominal exam to visualize the uterus and ovaries.  COMPARISON:  05/11/2014  FINDINGS: Uterus  Measurements: 6.8 x 2.7 x 3.2 cm, anteverted. No fibroids or other mass visualized. Tiny nabothian cysts in the cervix.  Endometrium  Thickness: 3.9 mm.  No focal abnormality visualized.  Right ovary  Measurements: 3.4 x 1.7 x 2.1 cm. Normal appearance/no adnexal mass.  Left ovary  Measurements: 2.8 x 1.9 x 2 cm. Normal appearance/no adnexal mass.  Other findings  No free fluid.  IMPRESSION: Normal ultrasound appearance of the uterus and ovaries.   Electronically Signed   By: Burman Nieves M.D.   On: 08/25/2014 22:42   US Pelvis Complete  08/25/2014   CLINICAL DATA:  Right lower quadrant pain.  EXAM: TRANSABDOMINAL AND TRANSVAGINAL ULTRASOUND OF PELVIS  TECHNIQUE: Both transabdominal and transvaginal ultrasound examinations of the pelvis were performed. Transabdominal technique was performed for global imaging of the pelvis including uterus, ovaries, adnexal regions, and pelvic cul-de-sac. It was necessary to proceed with endovaginal exam following the transabdominal exam to visualize the uterus and ovaries.  COMPARISON:  05/11/2014  FINDINGS: Uterus  Measurements: 6.8 x 2.7 x 3.2 cm, anteverted. No  fibroids or other mass visualized. Tiny nabothian cysts in the cervix.  Endometrium  Thickness: 3.9 mm.  No focal abnormality visualized.  Right ovary  Measurements: 3.4 x 1.7 x 2.1 cm. Normal appearance/no adnexal mass.  Left ovary  Measurements: 2.8 x 1.9 x 2 cm. Normal appearance/no adnexal mass.  Other findings  No free fluid.  IMPRESSION: Normal ultrasound appearance of the uterus and ovaries.   Electronically Signed   By: Burman Nieves M.D.   On: 08/25/2014 22:42     Assessment and Plan   1. Cervicitis   2. Pain    Rocephin given Depo given    Medication List    TAKE these medications        albuterol 108 (90 BASE) MCG/ACT inhaler  Commonly known as:  PROVENTIL HFA;VENTOLIN HFA  Inhale 2 puffs into the lungs every 6 (six) hours as needed for wheezing or shortness of breath.     ALKA SELTZER PLUS PO  Take 2 each by mouth daily as needed (For heartburn.).     doxycycline 50 MG capsule  Commonly known as:  VIBRAMYCIN  Take 2 capsules (100 mg total) by mouth 2 (two) times daily.     pantoprazole 40 MG tablet  Commonly known as:  PROTONIX  Take 1 tablet (40 mg total) by mouth daily.       Follow-up Information    Follow up with [facility] [place].   Why:  As needed   Contact information:   [address] [facility] [address] [phone]      Tawnya Crook 08/25/2014, 10:47 PM

## 2014-08-25 NOTE — Discharge Instructions (Signed)
Cervicitis °Cervicitis is a soreness and swelling (inflammation) of the cervix. Your cervix is located at the bottom of your uterus. It opens up to the vagina. °CAUSES  °· Sexually transmitted infections (STIs).   °· Allergic reaction.   °· Medicines or birth control devices that are put in the vagina.   °· Injury to the cervix.   °· Bacterial infections.   °RISK FACTORS °You are at greater risk if you: °· Have unprotected sexual intercourse. °· Have sexual intercourse with many partners. °· Began sexual intercourse at an early age. °· Have a history of STIs. °SYMPTOMS  °There may be no symptoms. If symptoms occur, they may include:  °· Gray, white, yellow, or bad-smelling vaginal discharge.   °· Pain or itching of the area outside the vagina.   °· Painful sexual intercourse.   °· Lower abdominal or lower back pain, especially during intercourse.   °· Frequent urination.   °· Abnormal vaginal bleeding between periods, after sexual intercourse, or after menopause.   °· Pressure or a heavy feeling in the pelvis.   °DIAGNOSIS  °Diagnosis is made after a pelvic exam. Other tests may include:  °· Examination of any discharge under a microscope (wet prep).   °· A Pap test.   °TREATMENT  °Treatment will depend on the cause of cervicitis. If it is caused by an STI, both you and your partner will need to be treated. Antibiotic medicines will be given.  °HOME CARE INSTRUCTIONS  °· Do not have sexual intercourse until your health care provider says it is okay.   °· Do not have sexual intercourse until your partner has been treated, if your cervicitis is caused by an STI.   °· Take your antibiotics as directed. Finish them even if you start to feel better.   °SEEK MEDICAL CARE IF: °· Your symptoms come back.   °· You have a fever.   °MAKE SURE YOU:  °· Understand these instructions. °· Will watch your condition. °· Will get help right away if you are not doing well or get worse. °Document Released: 08/07/2005 Document Revised:  08/12/2013 Document Reviewed: 01/29/2013 °Exit[employer]® Patient Information ©2015 [employer]. This information is not intended to replace advice given to you by your health care provider. Make sure you discuss any questions you have with your health care provider. ° °

## 2014-08-25 NOTE — Progress Notes (Signed)
Pt  States she doesn't remember what time it was but it was around the time it got dark

## 2014-08-25 NOTE — MAU Note (Addendum)
PT SAYS SHE  HAD LMP-10-20     THEN NOV AND DEC   NO CYCLE.    NO  BIRTH CONTROL.  LAST SEX-   SUN       DID HPT-  X4  AFTER 11-20-   THRU  DEC   -   ALL  NEG.    HAD PINK  BLEEDING-  WHEN  WIPED   LAST WEEK .   STARTED HAVING   ABD  PAIN    SINCE OCT - OFF/ ON.     SAW GYN DR   -     THINKS  2014-  DOESN'T  KNOW   NAME.     HAS FAMILY DR-    DON'T KNOW     NAME.

## 2014-08-26 LAB — RPR

## 2014-08-26 LAB — GC/CHLAMYDIA PROBE AMP
CT PROBE, AMP APTIMA: POSITIVE — AB
GC PROBE AMP APTIMA: NEGATIVE

## 2014-09-01 LAB — HIV ANTIBODY (ROUTINE TESTING W REFLEX): HIV 1/HIV 2 AB: NONREACTIVE

## 2015-04-06 IMAGING — US US TRANSVAGINAL NON-OB
1 series · 14 of 25 positions shown · non-contrast
Comparison: 05/11/2014

CLINICAL DATA: Right lower quadrant pain.

EXAM:
TRANSABDOMINAL AND TRANSVAGINAL ULTRASOUND OF PELVIS
TECHNIQUE: Both transabdominal and transvaginal ultrasound examinations of the
pelvis were performed. Transabdominal technique was performed for
global imaging of the pelvis including uterus, ovaries, adnexal
regions, and pelvic cul-de-sac. It was necessary to proceed with
endovaginal exam following the transabdominal exam to visualize the
uterus and ovaries.

[Series 1: us pelvis complete · 14 of 51 slices shown]
[im 1/51]
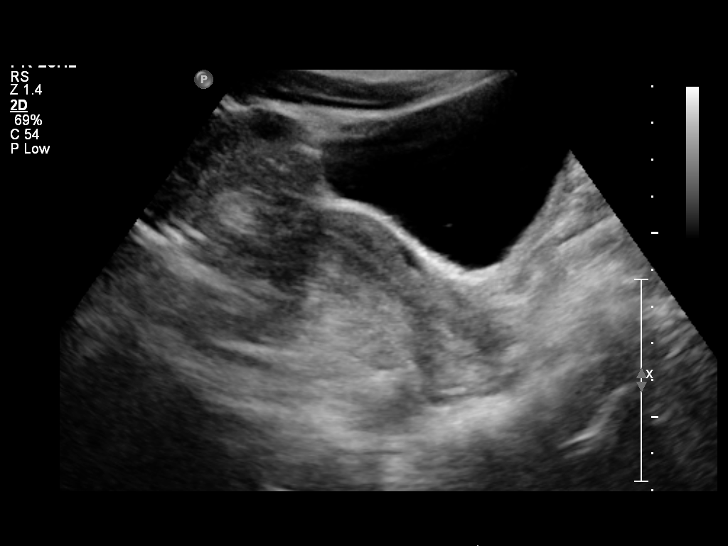
[im 5/51]
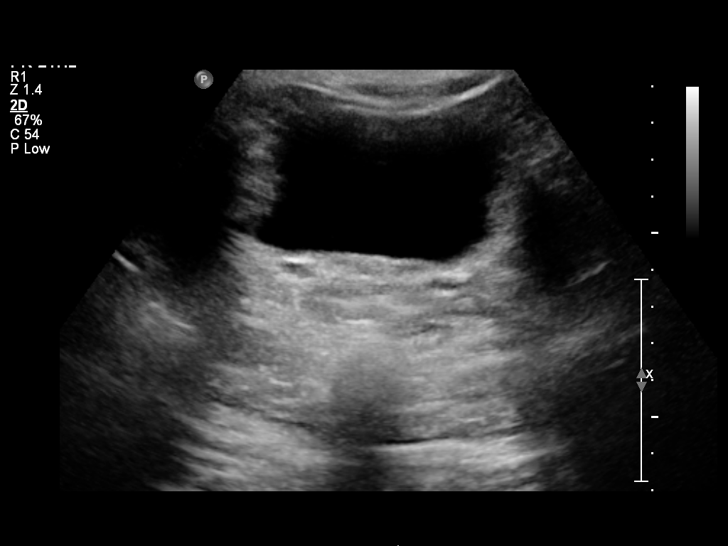
[im 9/51]
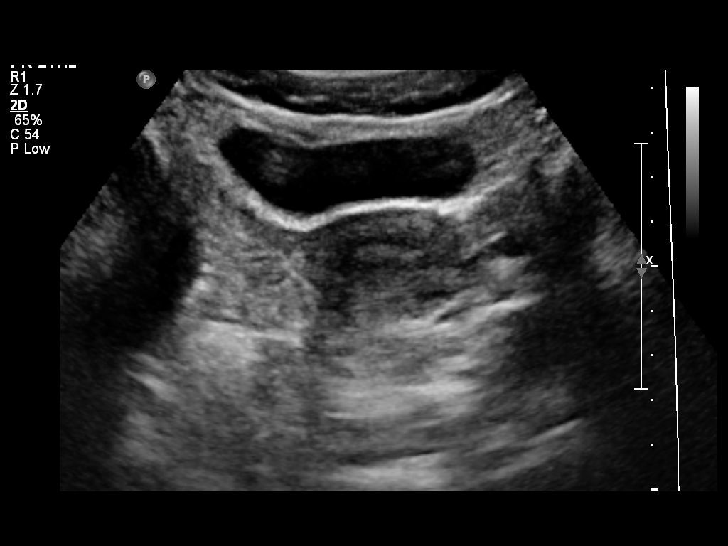
[im 13/51]
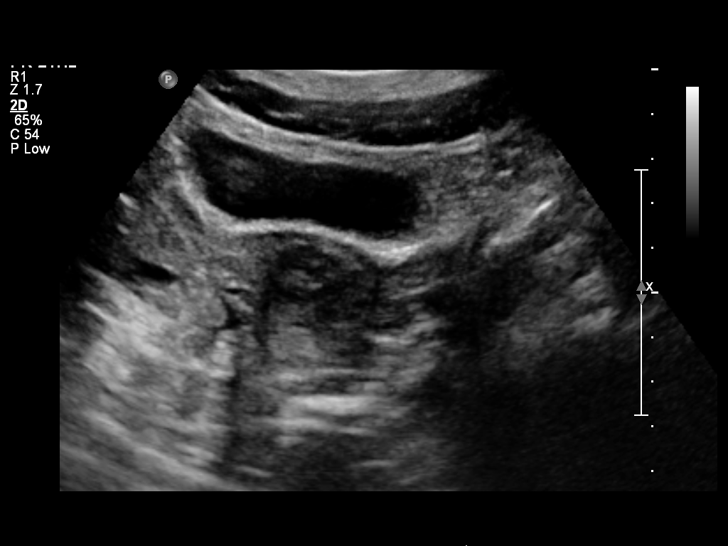
[im 17/51]
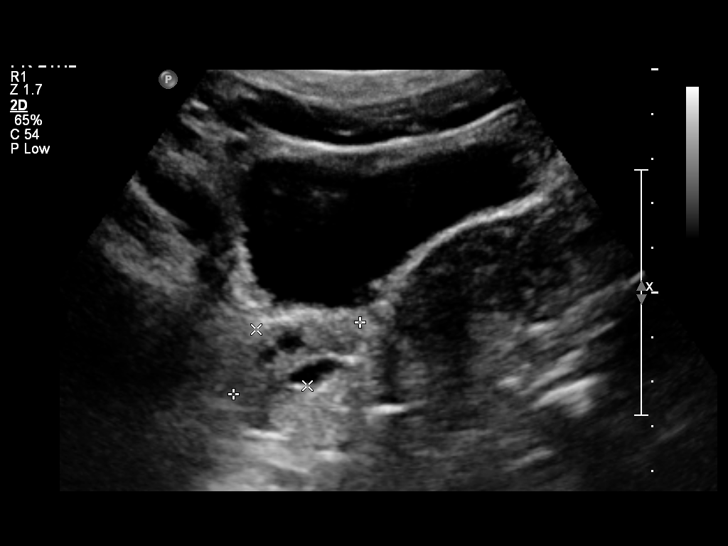
[im 19/51]
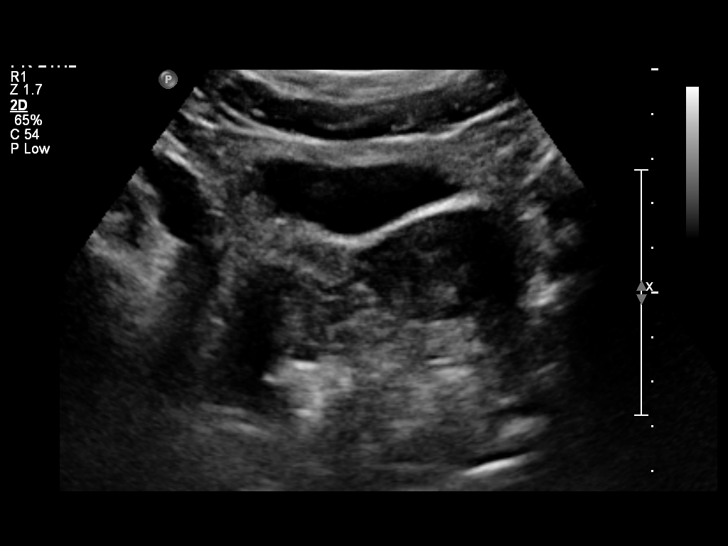
[im 23/51]
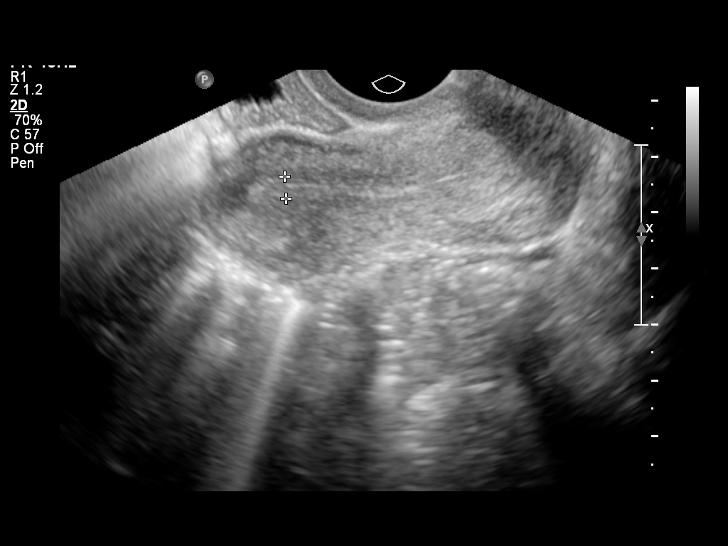
[im 28/51]
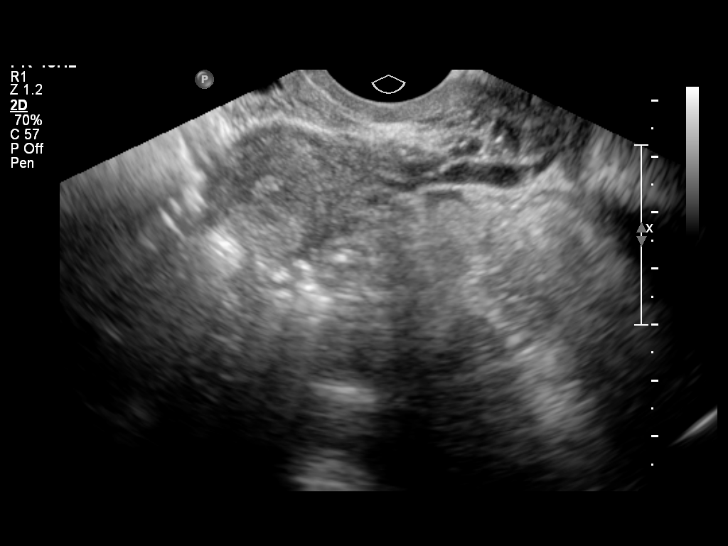
[im 32/51]
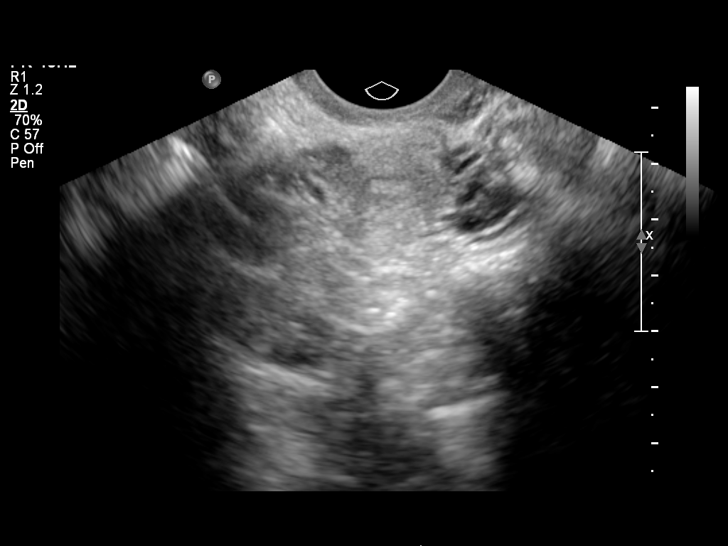
[im 34/51]
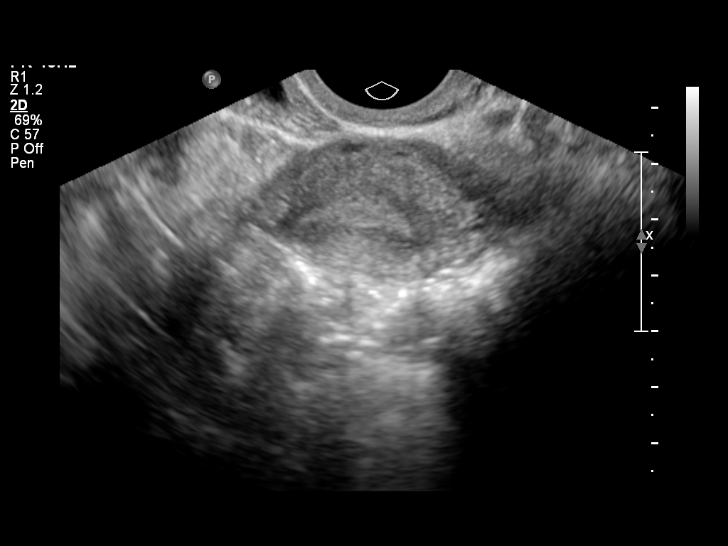
[im 38/51]
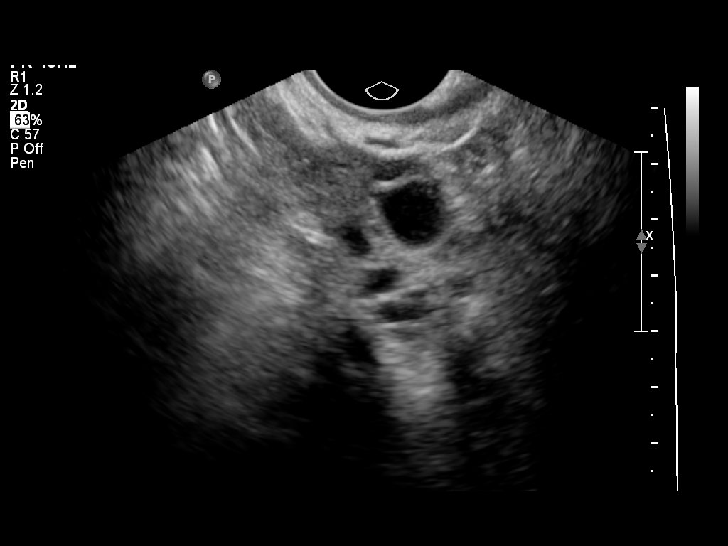
[im 42/51]
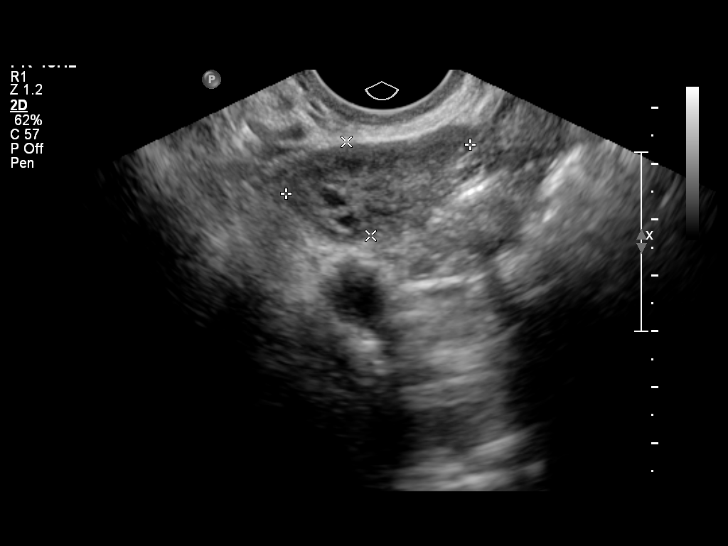
[im 46/51]
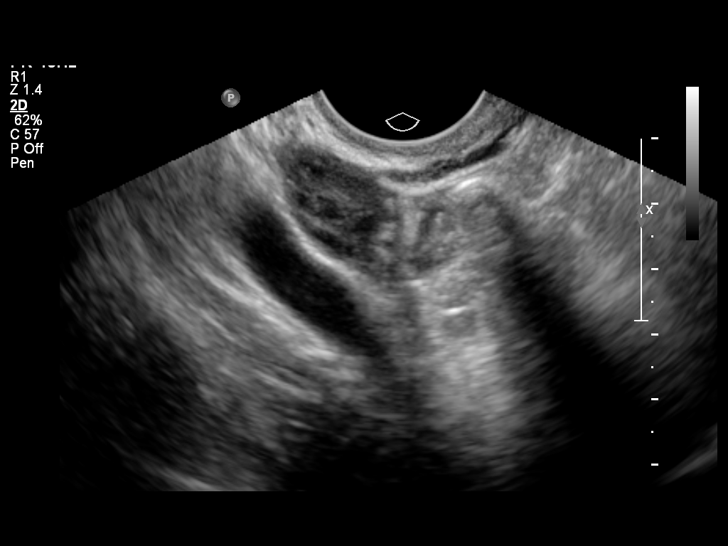
[im 51/51]
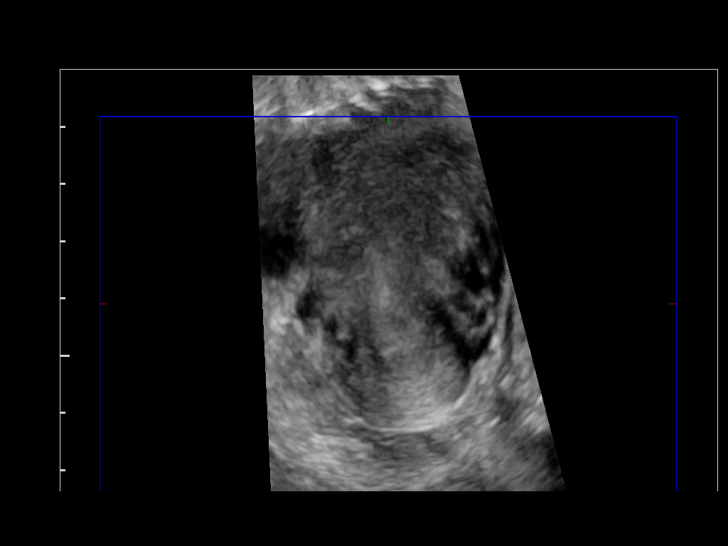

[14 of 25 positions shown; findings below may reference images not displayed]

FINDINGS: Uterus

Measurements: 6.8 x 2.7 x 3.2 cm, anteverted. No fibroids or other
mass visualized. Tiny nabothian cysts in the cervix.

Endometrium

Thickness: 3.9 mm.  No focal abnormality visualized.

Right ovary

Measurements: 3.4 x 1.7 x 2.1 cm. Normal appearance/no adnexal mass.

Left ovary

Measurements: 2.8 x 1.9 x 2 cm. Normal appearance/no adnexal mass.

Other findings

No free fluid.
IMPRESSION: Normal ultrasound appearance of the uterus and ovaries.

## 2015-04-29 ENCOUNTER — Inpatient Hospital Stay (HOSPITAL_COMMUNITY)
Admission: AD | Admit: 2015-04-29 | Discharge: 2015-04-29 | Disposition: A | Discharge status: Home / Self Care | Payer: Self-pay | Source: Ambulatory Visit | Attending: Obstetrics and Gynecology | Admitting: Obstetrics and Gynecology

## 2015-04-29 ENCOUNTER — Encounter (HOSPITAL_COMMUNITY): Payer: Self-pay | Admitting: *Deleted

## 2015-04-29 DIAGNOSIS — R1011 Right upper quadrant pain: Secondary | ICD-10-CM

## 2015-04-29 DIAGNOSIS — Z87891 Personal history of nicotine dependence: Secondary | ICD-10-CM | POA: Insufficient documentation

## 2015-04-29 DIAGNOSIS — Z880 Allergy status to penicillin: Secondary | ICD-10-CM | POA: Insufficient documentation

## 2015-04-29 DIAGNOSIS — R1010 Upper abdominal pain, unspecified: Secondary | ICD-10-CM | POA: Insufficient documentation

## 2015-04-29 DIAGNOSIS — K2970 Gastritis, unspecified, without bleeding: Secondary | ICD-10-CM | POA: Insufficient documentation

## 2015-04-29 DIAGNOSIS — K219 Gastro-esophageal reflux disease without esophagitis: Secondary | ICD-10-CM | POA: Insufficient documentation

## 2015-04-29 LAB — COMPREHENSIVE METABOLIC PANEL
ALT: 17 U/L (ref 14–54)
ANION GAP: 7 (ref 5–15)
AST: 20 U/L (ref 15–41)
Albumin: 4.3 g/dL (ref 3.5–5.0)
Alkaline Phosphatase: 51 U/L (ref 38–126)
BUN: 15 mg/dL (ref 6–20)
CHLORIDE: 106 mmol/L (ref 101–111)
CO2: 26 mmol/L (ref 22–32)
CREATININE: 0.68 mg/dL (ref 0.44–1.00)
Calcium: 8.9 mg/dL (ref 8.9–10.3)
GFR calc non Af Amer: >60 mL/min (ref >60)
Glucose, Bld: 98 mg/dL (ref 65–99)
Potassium: 4.1 mmol/L (ref 3.5–5.1)
SODIUM: 139 mmol/L (ref 135–145)
Total Bilirubin: 0.5 mg/dL (ref 0.3–1.2)
Total Protein: 7 g/dL (ref 6.5–8.1)

## 2015-04-29 LAB — URINALYSIS, ROUTINE W REFLEX MICROSCOPIC
BILIRUBIN URINE: NEGATIVE
Glucose, UA: NEGATIVE mg/dL
KETONES UR: NEGATIVE mg/dL
Leukocytes, UA: NEGATIVE
Nitrite: NEGATIVE
PH: 6 (ref 5.0–8.0)
PROTEIN: NEGATIVE mg/dL
Specific Gravity, Urine: >1.03 — ABNORMAL HIGH (ref 1.005–1.030)
Urobilinogen, UA: 0.2 mg/dL (ref 0.0–1.0)

## 2015-04-29 LAB — CBC WITH DIFFERENTIAL/PLATELET
Basophils Absolute: 0 10*3/uL (ref 0.0–0.1)
Basophils Relative: 0 % (ref 0–1)
EOS ABS: 0.1 10*3/uL (ref 0.0–0.7)
EOS PCT: 1 % (ref 0–5)
HCT: 39 % (ref 36.0–46.0)
Hemoglobin: 13.1 g/dL (ref 12.0–15.0)
LYMPHS ABS: 2.1 10*3/uL (ref 0.7–4.0)
Lymphocytes Relative: 34 % (ref 12–46)
MCH: 31.6 pg (ref 26.0–34.0)
MCHC: 33.6 g/dL (ref 30.0–36.0)
MCV: 94.2 fL (ref 78.0–100.0)
Monocytes Absolute: 0.4 10*3/uL (ref 0.1–1.0)
Monocytes Relative: 6 % (ref 3–12)
Neutro Abs: 3.7 10*3/uL (ref 1.7–7.7)
Neutrophils Relative %: 59 % (ref 43–77)
PLATELETS: 195 10*3/uL (ref 150–400)
RBC: 4.14 MIL/uL (ref 3.87–5.11)
RDW: 13.1 % (ref 11.5–15.5)
WBC: 6.3 10*3/uL (ref 4.0–10.5)

## 2015-04-29 LAB — URINE MICROSCOPIC-ADD ON

## 2015-04-29 LAB — POCT PREGNANCY, URINE: Preg Test, Ur: NEGATIVE

## 2015-04-29 LAB — LIPASE, BLOOD: Lipase: 24 U/L (ref 22–51)

## 2015-04-29 LAB — AMYLASE: Amylase: 52 U/L (ref 28–100)

## 2015-04-29 MED ORDER — KETOROLAC TROMETHAMINE 60 MG/2ML IM SOLN
60.0000 mg | Freq: Once | INTRAMUSCULAR | Status: AC
  Administered 2015-04-29: 60 mg via INTRAMUSCULAR
  Filled 2015-04-29: qty 2

## 2015-04-29 MED ORDER — FAMOTIDINE 20 MG PO TABS
10.0000 mg | ORAL_TABLET | Freq: Once | ORAL | Status: AC
  Administered 2015-04-29: 10 mg via ORAL

## 2015-04-29 MED ORDER — FAMOTIDINE 20 MG PO TABS
20.0000 mg | ORAL_TABLET | Freq: Every day | ORAL | Status: DC
  Filled 2015-04-29: qty 1

## 2015-04-29 NOTE — Discharge Instructions (Signed)

## 2015-04-29 NOTE — MAU Provider Note (Signed)
History     CSN: [id number]  Arrival date and time: 04/29/15 0030   First Provider Initiated Contact with Patient 04/29/15 0132      No chief complaint on file.  HPI [name] is a 24 y.o. [id number] who presents to MAU today with complaint of periumbilical pain that radiates to the RUQ since yesterday. She rates pain at 7/10 now. She has not taken anything for pain. She states some nausea without vomiting, diarrhea or constipation. She denies fever or UTI symptoms. She states LMP last night and heavier than normal. She has used 3 tampons today.   OB History    Gravida Para Term Preterm AB TAB SAB Ectopic Multiple Living        Past Medical History  Diagnosis Date  . Acute pancreatitis   . Ovarian cyst     Past Surgical History  Procedure Laterality Date  . Tonsillectomy    . Wisdom tooth extraction      Family History  Problem Relation Age of Onset  . Hypertension Maternal Grandmother   . Heart disease Maternal Grandfather   . Stroke Maternal Grandfather   . Diabetes Neg Hx   . Cancer Neg Hx     Social History  Substance Use Topics  . Smoking status: Former Smoker -- 0.50 packs/day    Types: Cigarettes  . Smokeless tobacco: Never Used  . Alcohol Use: 0.6 oz/week    1 Cans of beer per week     Comment: occasional     Allergies:  Allergies  Allergen Reactions  . Penicillins Rash    Prescriptions prior to admission  Medication Sig Dispense Refill Last Dose  . albuterol (PROVENTIL HFA;VENTOLIN HFA) 108 (90 BASE) MCG/ACT inhaler Inhale 2 puffs into the lungs every 6 (six) hours as needed for wheezing or shortness of breath.   Unknown at Unknown time  . doxycycline (VIBRAMYCIN) 50 MG capsule Take 2 capsules (100 mg total) by mouth 2 (two) times daily. 56 capsule 0 Unknown at Unknown time  . pantoprazole (PROTONIX) 40 MG tablet Take 1 tablet (40 mg total) by mouth daily. (Patient not taking: Reported on 08/25/2014) 30 tablet 1 Unknown at  Unknown time  . Phenyleph-Doxylamine-DM-APAP (ALKA SELTZER PLUS PO) Take 2 each by mouth daily as needed (For heartburn.).   Unknown at Unknown time    Review of Systems  Constitutional: Negative for fever and malaise/fatigue.  Gastrointestinal: Positive for nausea and abdominal pain. Negative for vomiting, diarrhea and constipation.  Genitourinary: Negative for dysuria, urgency and frequency.       + vaginal bleeding Neg - vaginal discharge   Physical Exam   Blood pressure 120/58, pulse 58, temperature 98.8 F (37.1 C), temperature source Oral, resp. rate 16, height  (1.575 m), weight 126 lb (57.153 kg), last menstrual period 04/28/2015, SpO2 99 %.  Physical Exam  Nursing note and vitals reviewed. Constitutional: She is oriented to person, place, and time. She appears well-developed and well-nourished. No distress.  HENT:  Head: Normocephalic and atraumatic.  Cardiovascular: Normal rate.   Respiratory: Effort normal.  GI: Soft. She exhibits no distension and no mass. There is tenderness (moderate tenderness to palpation of the RUQ and epigastric region). There is no rebound and no guarding.  Neurological: She is alert and oriented to person, place, and time.  Skin: Skin is warm and dry. No erythema.  Psychiatric: She has a normal mood and affect.  Results for orders placed or performed during the hospital encounter of 04/29/15 (from the past 24 hour(s))  Urinalysis, Routine w reflex microscopic (not at [facility])     Status: Abnormal   Collection Time: 04/29/15 12:50 AM  Result Value Ref Range   Color, Urine YELLOW YELLOW   APPearance CLEAR CLEAR   Specific Gravity, Urine >1.030 (H) 1.005 - 1.030   pH 6.0 5.0 - 8.0   Glucose, UA NEGATIVE NEGATIVE mg/dL   Hgb urine dipstick SMALL (A) NEGATIVE   Bilirubin Urine NEGATIVE NEGATIVE   Ketones, ur NEGATIVE NEGATIVE mg/dL   Protein, ur NEGATIVE NEGATIVE mg/dL   Urobilinogen, UA 0.2 0.0 - 1.0 mg/dL   Nitrite NEGATIVE NEGATIVE    Leukocytes, UA NEGATIVE NEGATIVE  Urine microscopic-add on     Status: None   Collection Time: 04/29/15 12:50 AM  Result Value Ref Range   Squamous Epithelial / LPF RARE RARE   WBC, UA 0-2 <3 WBC/hpf   RBC / HPF 0-2 <3 RBC/hpf   Bacteria, UA RARE RARE   Urine-Other MUCOUS PRESENT   Pregnancy, urine POC     Status: None   Collection Time: 04/29/15  1:08 AM  Result Value Ref Range   Preg Test, Ur NEGATIVE NEGATIVE  CBC with Differential/Platelet     Status: None   Collection Time: 04/29/15  1:45 AM  Result Value Ref Range   WBC 6.3 4.0 - 10.5 K/uL   RBC 4.14 3.87 - 5.11 MIL/uL   Hemoglobin 13.1 12.0 - 15.0 g/dL   HCT 16.1 09.6 - 04.5 %   MCV 94.2 78.0 - 100.0 fL   MCH 31.6 26.0 - 34.0 pg   MCHC 33.6 30.0 - 36.0 g/dL   RDW 40.9 81.1 - 91.4 %   Platelets 195 150 - 400 K/uL   Neutrophils Relative % 59 43 - 77 %   Neutro Abs 3.7 1.7 - 7.7 K/uL   Lymphocytes Relative 34 12 - 46 %   Lymphs Abs 2.1 0.7 - 4.0 K/uL   Monocytes Relative 6 3 - 12 %   Monocytes Absolute 0.4 0.1 - 1.0 K/uL   Eosinophils Relative 1 0 - 5 %   Eosinophils Absolute 0.1 0.0 - 0.7 K/uL   Basophils Relative 0 0 - 1 %   Basophils Absolute 0.0 0.0 - 0.1 K/uL  Comprehensive metabolic panel     Status: None   Collection Time: 04/29/15  1:45 AM  Result Value Ref Range   Sodium 139 135 - 145 mmol/L   Potassium 4.1 3.5 - 5.1 mmol/L   Chloride 106 101 - 111 mmol/L   CO2 26 22 - 32 mmol/L   Glucose, Bld 98 65 - 99 mg/dL   BUN 15 6 - 20 mg/dL   Creatinine, Ser 7.82 0.44 - 1.00 mg/dL   Calcium 8.9 8.9 - 95.6 mg/dL   Total Protein 7.0 6.5 - 8.1 g/dL   Albumin 4.3 3.5 - 5.0 g/dL   AST 20 15 - 41 U/L   ALT 17 14 - 54 U/L   Alkaline Phosphatase 51 38 - 126 U/L   Total Bilirubin 0.5 0.3 - 1.2 mg/dL   GFR calc non Af Amer >60 >60 mL/min   GFR calc Af Amer >60 >60 mL/min   Anion gap 7 5 - 15  Amylase     Status: None   Collection Time: 04/29/15  1:45 AM  Result Value Ref Range   Amylase 52 28 - 100 U/L  Lipase,  blood     Status: None   Collection Time: 04/29/15  1:45 AM  Result Value Ref Range   Lipase 24 22 - 51 U/L     MAU Course  Procedures None  MDM UPT - negative UA, CBC, CMP, Lipase and Amylase No evidence of acute process. Most likely gastritis vs GERD. 20 mg Pepcid PO given prior to discharge Assessment and Plan  A: Upper abdominal pain Gastritis vs GERD  P: Discharge home Pepcid OTC recommended daily  Warning signs for worsening condition discussed Patient advised to follow-up with PCP or choice if symptoms persist Patient may return to MAU as needed or if her condition were to change or worsen, however discussed that this was not a GYN issue and she should consider [facility] or [facility] for further evaluation   Marny Lowenstein, PA-C  04/29/2015, 3:11 AM

## 2015-04-29 NOTE — MAU Note (Signed)
Pt reports a really sharp pain off/on that starts at her umbilicus and radiates out to her right side x 2 days. Denies N/V/D or fever.

## 2015-05-31 ENCOUNTER — Emergency Department: Payer: Medicaid Other

## 2015-05-31 ENCOUNTER — Emergency Department
Admission: EM | Admit: 2015-05-31 | Discharge: 2015-05-31 | Disposition: A | Discharge status: Home / Self Care | Payer: Medicaid Other | Attending: Emergency Medicine | Admitting: Emergency Medicine

## 2015-05-31 ENCOUNTER — Encounter: Payer: Self-pay | Admitting: *Deleted

## 2015-05-31 DIAGNOSIS — S9002XA Contusion of left ankle, initial encounter: Secondary | ICD-10-CM | POA: Insufficient documentation

## 2015-05-31 DIAGNOSIS — Z880 Allergy status to penicillin: Secondary | ICD-10-CM | POA: Insufficient documentation

## 2015-05-31 DIAGNOSIS — X58XXXA Exposure to other specified factors, initial encounter: Secondary | ICD-10-CM | POA: Insufficient documentation

## 2015-05-31 DIAGNOSIS — Z79899 Other long term (current) drug therapy: Secondary | ICD-10-CM | POA: Insufficient documentation

## 2015-05-31 DIAGNOSIS — Z87891 Personal history of nicotine dependence: Secondary | ICD-10-CM | POA: Insufficient documentation

## 2015-05-31 DIAGNOSIS — Y998 Other external cause status: Secondary | ICD-10-CM | POA: Insufficient documentation

## 2015-05-31 DIAGNOSIS — S93402A Sprain of unspecified ligament of left ankle, initial encounter: Secondary | ICD-10-CM | POA: Insufficient documentation

## 2015-05-31 DIAGNOSIS — Y9289 Other specified places as the place of occurrence of the external cause: Secondary | ICD-10-CM | POA: Insufficient documentation

## 2015-05-31 DIAGNOSIS — Y9389 Activity, other specified: Secondary | ICD-10-CM | POA: Insufficient documentation

## 2015-05-31 MED ORDER — IBUPROFEN 800 MG PO TABS
ORAL_TABLET | ORAL | Status: AC
  Filled 2015-05-31: qty 1

## 2015-05-31 MED ORDER — IBUPROFEN 800 MG PO TABS
800.0000 mg | ORAL_TABLET | Freq: Once | ORAL | Status: AC
  Administered 2015-05-31: 800 mg via ORAL

## 2015-05-31 NOTE — ED Provider Notes (Signed)
[facility] Emergency Department Provider Note ____________________________________________  Time seen: 1530  I have reviewed the triage vital signs and the nursing notes.  HISTORY  Chief Complaint  Ankle Pain   HPI Maria [name] is a 24 y.o. female reports to the ED for evaluation management of left ankle pain after an injury last night. She describes she was playing around with a friend, when he picked her up, and when he put her back on her feet, she was off balance. This caused her to rolled her left ankle, in an inversion manner, and fall with both of their weight on her ankle.Scribes an audible and palpable pop to the left ankle as she fell. She reports today with pain, swelling, and bruising to the lateral aspect of the left ankle. She rates her pain at a 6/10 in triage. She denies any other injury at this time.  Past Medical History  Diagnosis Date  . Acute pancreatitis   . Ovarian cyst     There are no active problems to display for this patient.   Past Surgical History  Procedure Laterality Date  . Tonsillectomy    . Wisdom tooth extraction      Current Outpatient Rx  Name  Route  Sig  Dispense  Refill  . albuterol (PROVENTIL HFA;VENTOLIN HFA) 108 (90 BASE) MCG/ACT inhaler   Inhalation   Inhale 2 puffs into the lungs every 6 (six) hours as needed for wheezing or shortness of breath.         . pantoprazole (PROTONIX) 40 MG tablet   Oral   Take 1 tablet (40 mg total) by mouth daily. Patient not taking: Reported on 08/25/2014   30 tablet   1   . Phenyleph-Doxylamine-DM-APAP (ALKA SELTZER PLUS PO)   Oral   Take 2 each by mouth daily as needed (For heartburn.).           Allergies Penicillins  Family History  Problem Relation Age of Onset  . Hypertension Maternal Grandmother   . Heart disease Maternal Grandfather   . Stroke Maternal Grandfather   . Diabetes Neg Hx   . Cancer Neg Hx     Social History Social History   Substance Use Topics  . Smoking status: Former Smoker -- 0.50 packs/day    Types: Cigarettes  . Smokeless tobacco: Never Used  . Alcohol Use: 0.6 oz/week    1 Cans of beer per week     Comment: occasional    Review of Systems  Constitutional: Negative for fever. Eyes: Negative for visual changes. ENT: Negative for sore throat. Cardiovascular: Negative for chest pain. Respiratory: Negative for shortness of breath. Gastrointestinal: Negative for abdominal pain, vomiting and diarrhea. Genitourinary: Negative for dysuria. Musculoskeletal: Negative for back pain. Left ankle pain as above. Skin: Negative for rash. Neurological: Negative for headaches, focal weakness or numbness. ____________________________________________  PHYSICAL EXAM:  VITAL SIGNS: ED Triage Vitals  Enc Vitals Group     BP 05/31/15 1512 127/75 mmHg     Pulse Rate 05/31/15 1512 94     Resp 05/31/15 1512 20     Temp 05/31/15 1512 98.8 F (37.1 C)     Temp Source 05/31/15 1512 Oral     SpO2 05/31/15 1512 98 %     Weight 05/31/15 1512 115 lb (52.164 kg)     Height 05/31/15 1512  (1.575 m)     Head Cir --      Peak Flow --  Pain Score 05/31/15 1515 6     Pain Loc --      Pain Edu? --      Excl. in GC? --    Constitutional: Alert and oriented. Well appearing and in no distress. Head: Normocephalic and atraumatic.      Eyes: Conjunctivae are normal. PERRL. Normal extraocular movements      Ears: Canals clear. TMs intact bilaterally.   Nose: No congestion/rhinorrhea.   Mouth/Throat: Mucous membranes are moist.   Neck: Supple. No thyromegaly. Hematological/Lymphatic/Immunological: No cervical lymphadenopathy. Cardiovascular: Normal rate, regular rhythm. Normal distal pulses. Respiratory: Normal respiratory effort. No wheezes/rales/rhonchi. Gastrointestinal: Soft and nontender. No distention. Musculoskeletal: Ankle with obvious lateral soft tissue swelling, tenderness, and ecchymosis. She  is noted to have normal active range of motion in all planes at the ankle. She has a negative drawer exam. She is not tender to palpation of the Achilles or calf on exam. Nontender with normal range of motion in all extremities.  Neurologic:  Normal gait without ataxia. Normal speech and language. No gross focal neurologic deficits are appreciated. Skin:  Skin is warm, dry and intact. No rash noted. Psychiatric: Mood and affect are normal. Patient exhibits appropriate insight and judgment. ____________________________________________   RADIOLOGY  Left Ankle IMPRESSION: No acute fracture or subluxation. Lateral soft tissue swelling.  I, Kymere Fullington, Charlesetta Ivory, personally viewed and evaluated these images (plain radiographs) as part of my medical decision making.  ____________________________________________  PROCEDURES  IBU 800 mg PO Ankle stirrup Ace wrap ____________________________________________  INITIAL IMPRESSION / ASSESSMENT AND PLAN / ED COURSE  Patient with an acute grade 2 left ankle sprain on exam. He will be discharged with an ankle stirrup for support, and instructions on RICE management of her sprain. Will follow-up with orthopedics as needed for ongoing symptoms. ____________________________________________  FINAL CLINICAL IMPRESSION(S) / ED DIAGNOSES  Final diagnoses:  Left ankle sprain, initial encounter      Lissa Hoard, PA-C 05/31/15 1632  Sharyn Creamer, MD 05/31/15 [phone]

## 2015-05-31 NOTE — Discharge Instructions (Signed)
Ankle Sprain °An ankle sprain is an injury to the strong, fibrous tissues (ligaments) that hold the bones of your ankle joint together.  °CAUSES °An ankle sprain is usually caused by a fall or by twisting your ankle. Ankle sprains most commonly occur when you step on the outer edge of your foot, and your ankle turns inward. People who participate in sports are more prone to these types of injuries.  °SYMPTOMS  °· Pain in your ankle. The pain may be present at rest or only when you are trying to stand or walk. °· Swelling. °· Bruising. Bruising may develop immediately or within 1 to 2 days after your injury. °· Difficulty standing or walking, particularly when turning corners or changing directions. °DIAGNOSIS  °Your caregiver will ask you details about your injury and perform a physical exam of your ankle to determine if you have an ankle sprain. During the physical exam, your caregiver will press on and apply pressure to specific areas of your foot and ankle. Your caregiver will try to move your ankle in certain ways. An X-ray exam may be done to be sure a bone was not broken or a ligament did not separate from one of the bones in your ankle (avulsion fracture).  °TREATMENT  °Certain types of braces can help stabilize your ankle. Your caregiver can make a recommendation for this. Your caregiver may recommend the use of medicine for pain. If your sprain is severe, your caregiver may refer you to a surgeon who helps to restore function to parts of your skeletal system (orthopedist) or a physical therapist. °HOME CARE INSTRUCTIONS  °· Apply ice to your injury for 1-2 days or as directed by your caregiver. Applying ice helps to reduce inflammation and pain. °¨ Put ice in a plastic bag. °¨ Place a towel between your skin and the bag. °¨ Leave the ice on for 15-20 minutes at a time, every 2 hours while you are awake. °· Only take over-the-counter or prescription medicines for pain, discomfort, or fever as directed by  your caregiver. °· Elevate your injured ankle above the level of your heart as much as possible for 2-3 days. °· If your caregiver recommends crutches, use them as instructed. Gradually put weight on the affected ankle. Continue to use crutches or a cane until you can walk without feeling pain in your ankle. °· If you have a plaster splint, wear the splint as directed by your caregiver. Do not rest it on anything harder than a pillow for the first 24 hours. Do not put weight on it. Do not get it wet. You may take it off to take a shower or bath. °· You may have been given an elastic bandage to wear around your ankle to provide support. If the elastic bandage is too tight (you have numbness or tingling in your foot or your foot becomes cold and blue), adjust the bandage to make it comfortable. °· If you have an air splint, you may blow more air into it or let air out to make it more comfortable. You may take your splint off at night and before taking a shower or bath. Wiggle your toes in the splint several times per day to decrease swelling. °SEEK MEDICAL CARE IF:  °· You have rapidly increasing bruising or swelling. °· Your toes feel extremely cold or you lose feeling in your foot. °· Your pain is not relieved with medicine. °SEEK IMMEDIATE MEDICAL CARE IF: °· Your toes are numb or blue. °·   You have severe pain that is increasing. MAKE SURE YOU:   Understand these instructions.  Will watch your condition.  Will get help right away if you are not doing well or get worse.   This information is not intended to replace advice given to you by your health care provider. Make sure you discuss any questions you have with your health care provider.   Document Released: 08/07/2005 Document Revised: 08/28/2014 Document Reviewed: 08/19/2011 Elsevier Interactive Patient Education 2016 [employer]   Take Ibuprofen as needed for pain & inflammation. Rest with the foot elevated and apply ice to reduce swelling.  Follow-up Dr. Martha Clan as needed for ongoing care.

## 2015-05-31 NOTE — ED Notes (Signed)
Pt has left ankle pain. Swelling to ankle noted.  Injured last night when another fell onto left ankle.

## 2015-05-31 NOTE — ED Notes (Signed)
Pt has left ankle pain after someone fell onto ankle last night.  Swelling noted

## 2015-09-20 ENCOUNTER — Encounter (HOSPITAL_COMMUNITY): Payer: Self-pay

## 2015-09-20 ENCOUNTER — Inpatient Hospital Stay (HOSPITAL_COMMUNITY)
Admission: AD | Admit: 2015-09-20 | Discharge: 2015-09-21 | Disposition: A | Discharge status: Home / Self Care | Payer: Self-pay | Source: Ambulatory Visit | Attending: Family Medicine | Admitting: Family Medicine

## 2015-09-20 ENCOUNTER — Inpatient Hospital Stay (HOSPITAL_COMMUNITY): Payer: Medicaid Other

## 2015-09-20 DIAGNOSIS — Z3A01 Less than 8 weeks gestation of pregnancy: Secondary | ICD-10-CM | POA: Insufficient documentation

## 2015-09-20 DIAGNOSIS — Z880 Allergy status to penicillin: Secondary | ICD-10-CM | POA: Insufficient documentation

## 2015-09-20 DIAGNOSIS — O26891 Other specified pregnancy related conditions, first trimester: Secondary | ICD-10-CM | POA: Insufficient documentation

## 2015-09-20 DIAGNOSIS — Z87891 Personal history of nicotine dependence: Secondary | ICD-10-CM | POA: Insufficient documentation

## 2015-09-20 DIAGNOSIS — R102 Pelvic and perineal pain: Secondary | ICD-10-CM | POA: Insufficient documentation

## 2015-09-20 LAB — URINALYSIS, ROUTINE W REFLEX MICROSCOPIC
Bilirubin Urine: NEGATIVE
Glucose, UA: NEGATIVE mg/dL
Hgb urine dipstick: NEGATIVE
KETONES UR: NEGATIVE mg/dL
NITRITE: NEGATIVE
PH: 6 (ref 5.0–8.0)
Protein, ur: NEGATIVE mg/dL
SPECIFIC GRAVITY, URINE: 1.02 (ref 1.005–1.030)

## 2015-09-20 LAB — WET PREP, GENITAL
Clue Cells Wet Prep HPF POC: NONE SEEN
SPERM: NONE SEEN
TRICH WET PREP: NONE SEEN
WBC WET PREP: NONE SEEN
YEAST WET PREP: NONE SEEN

## 2015-09-20 LAB — URINE MICROSCOPIC-ADD ON: RBC / HPF: NONE SEEN RBC/hpf (ref 0–5)

## 2015-09-20 LAB — CBC
HEMATOCRIT: 37.4 % (ref 36.0–46.0)
Hemoglobin: 12.7 g/dL (ref 12.0–15.0)
MCH: 31.9 pg (ref 26.0–34.0)
MCHC: 34 g/dL (ref 30.0–36.0)
MCV: 94 fL (ref 78.0–100.0)
Platelets: 234 10*3/uL (ref 150–400)
RBC: 3.98 MIL/uL (ref 3.87–5.11)
RDW: 13 % (ref 11.5–15.5)
WBC: 8.9 10*3/uL (ref 4.0–10.5)

## 2015-09-20 LAB — POCT PREGNANCY, URINE: Preg Test, Ur: POSITIVE — AB

## 2015-09-20 NOTE — MAU Provider Note (Signed)
History     CSN: [id number]  Arrival date and time: 09/20/15 2213   First Provider Initiated Contact with Patient 09/20/15 2247      Chief Complaint  Patient presents with  . Abdominal Pain   HPI Comments: LMP: end of December.   Abdominal Pain This is a new problem. The current episode started in the past 7 days. The onset quality is gradual. The problem occurs constantly. The problem has been unchanged. The pain is located in the suprapubic region. The pain is at a severity of 10/10. The quality of the pain is cramping. The abdominal pain does not radiate. Pertinent negatives include no constipation, diarrhea, dysuria, fever, frequency, nausea or vomiting. Nothing aggravates the pain. The pain is relieved by nothing. She has tried nothing for the symptoms.   Past Medical History  Diagnosis Date  . Acute pancreatitis   . Ovarian cyst     Past Surgical History  Procedure Laterality Date  . Tonsillectomy    . Wisdom tooth extraction      Family History  Problem Relation Age of Onset  . Hypertension Maternal Grandmother   . Heart disease Maternal Grandfather   . Stroke Maternal Grandfather   . Diabetes Neg Hx   . Cancer Neg Hx     Social History  Substance Use Topics  . Smoking status: Former Smoker -- 0.50 packs/day    Types: Cigarettes  . Smokeless tobacco: Never Used  . Alcohol Use: 0.6 oz/week    1 Cans of beer per week     Comment: occasional     Allergies:  Allergies  Allergen Reactions  . Penicillins Rash    Prescriptions prior to admission  Medication Sig Dispense Refill Last Dose  . albuterol (PROVENTIL HFA;VENTOLIN HFA) 108 (90 BASE) MCG/ACT inhaler Inhale 2 puffs into the lungs every 6 (six) hours as needed for wheezing or shortness of breath.   Unknown at Unknown time  . pantoprazole (PROTONIX) 40 MG tablet Take 1 tablet (40 mg total) by mouth daily. (Patient not taking: Reported on 08/25/2014) 30 tablet 1 Unknown at Unknown time  .  Phenyleph-Doxylamine-DM-APAP (ALKA SELTZER PLUS PO) Take 2 each by mouth daily as needed (For heartburn.).   Unknown at Unknown time    Review of Systems  Constitutional: Negative for fever and chills.  Gastrointestinal: Positive for abdominal pain. Negative for nausea, vomiting, diarrhea and constipation.  Genitourinary: Negative for dysuria, urgency and frequency.   Physical Exam   Blood pressure 132/76, pulse 88, temperature 98 F (36.7 C), temperature source Oral, resp. rate 18, last menstrual period 08/20/2015.  Physical Exam  Nursing note and vitals reviewed. Constitutional: She is oriented to person, place, and time. She appears well-developed and well-nourished. No distress.  HENT:  Head: Normocephalic.  Cardiovascular: Normal rate.   Respiratory: Effort normal.  GI: Soft. There is no tenderness. There is no rebound.  Neurological: She is alert and oriented to person, place, and time.  Skin: Skin is warm and dry.  Psychiatric: She has a normal mood and affect.   Results for orders placed or performed during the hospital encounter of 09/20/15 (from the past 24 hour(s))  Urinalysis, Routine w reflex microscopic (not at [facility])     Status: Abnormal   Collection Time: 09/20/15 10:19 PM  Result Value Ref Range   Color, Urine YELLOW YELLOW   APPearance CLEAR CLEAR   Specific Gravity, Urine 1.020 1.005 - 1.030   pH 6.0 5.0 - 8.0   Glucose, UA  NEGATIVE NEGATIVE mg/dL   Hgb urine dipstick NEGATIVE NEGATIVE   Bilirubin Urine NEGATIVE NEGATIVE   Ketones, ur NEGATIVE NEGATIVE mg/dL   Protein, ur NEGATIVE NEGATIVE mg/dL   Nitrite NEGATIVE NEGATIVE   Leukocytes, UA TRACE (A) NEGATIVE  Urine microscopic-add on     Status: Abnormal   Collection Time: 09/20/15 10:19 PM  Result Value Ref Range   Squamous Epithelial / LPF 0-5 (A) NONE SEEN   WBC, UA 6-30 0 - 5 WBC/hpf   RBC / HPF NONE SEEN 0 - 5 RBC/hpf   Bacteria, UA RARE (A) NONE SEEN  Pregnancy, urine POC     Status: Abnormal    Collection Time: 09/20/15 10:39 PM  Result Value Ref Range   Preg Test, Ur POSITIVE (A) NEGATIVE  CBC     Status: None   Collection Time: 09/20/15 10:55 PM  Result Value Ref Range   WBC 8.9 4.0 - 10.5 K/uL   RBC 3.98 3.87 - 5.11 MIL/uL   Hemoglobin 12.7 12.0 - 15.0 g/dL   HCT 16.1 09.6 - 04.5 %   MCV 94.0 78.0 - 100.0 fL   MCH 31.9 26.0 - 34.0 pg   MCHC 34.0 30.0 - 36.0 g/dL   RDW 40.9 81.1 - 91.4 %   Platelets 234 150 - 400 K/uL  Wet prep, genital     Status: None   Collection Time: 09/20/15 11:05 PM  Result Value Ref Range   Yeast Wet Prep HPF POC NONE SEEN NONE SEEN   Trich, Wet Prep NONE SEEN NONE SEEN   Clue Cells Wet Prep HPF POC NONE SEEN NONE SEEN   WBC, Wet Prep HPF POC NONE SEEN NONE SEEN   Sperm NONE SEEN    US Ob Comp Less 14 Wks  09/21/2015  CLINICAL DATA:  Pelvic pain.  First trimester pregnancy. EXAM: OBSTETRIC <14 WK [place] AND TRANSVAGINAL OB US TECHNIQUE: Both transabdominal and transvaginal ultrasound examinations were performed for complete evaluation of the gestation as well as the maternal uterus, adnexal regions, and pelvic cul-de-sac. Transvaginal technique was performed to assess early pregnancy. COMPARISON:  08/25/2014 FINDINGS: Intrauterine gestational sac: Single Yolk sac:  Yes Embryo:  No Cardiac Activity: Not applicable Heart Rate: Not applicable  bpm MSD: 4.2  mm   5 w   1  d Maternal uterus/adnexae: Subchorionic hemorrhage: Small Right ovary: Normal Left ovary: Normal Other :None Free fluid:  None IMPRESSION: 1. Probable early intrauterine gestational sac with yolk sac, but no fetal pole, or cardiac activity yet visualized. Recommend follow-up quantitative B-HCG levels and follow-up US in 14 days to confirm and assess viability. This recommendation follows SRU consensus guidelines: Diagnostic Criteria for Nonviable Pregnancy Early in the First Trimester. Malva Limes Med 2013; 782:9562-13. 2. Small subchorionic hemorrhage. Electronically Signed   By: Signa Kell  M.D.   On: 09/21/2015 00:00   US Ob Transvaginal  09/21/2015  CLINICAL DATA:  Pelvic pain.  First trimester pregnancy. EXAM: OBSTETRIC <14 WK [place] AND TRANSVAGINAL OB US TECHNIQUE: Both transabdominal and transvaginal ultrasound examinations were performed for complete evaluation of the gestation as well as the maternal uterus, adnexal regions, and pelvic cul-de-sac. Transvaginal technique was performed to assess early pregnancy. COMPARISON:  08/25/2014 FINDINGS: Intrauterine gestational sac: Single Yolk sac:  Yes Embryo:  No Cardiac Activity: Not applicable Heart Rate: Not applicable  bpm MSD: 4.2  mm   5 w   1  d Maternal uterus/adnexae: Subchorionic hemorrhage: Small Right ovary: Normal Left ovary: Normal Other :  None Free fluid:  None IMPRESSION: 1. Probable early intrauterine gestational sac with yolk sac, but no fetal pole, or cardiac activity yet visualized. Recommend follow-up quantitative B-HCG levels and follow-up US in 14 days to confirm and assess viability. This recommendation follows SRU consensus guidelines: Diagnostic Criteria for Nonviable Pregnancy Early in the First Trimester. Malva Limes Med 2013; 161:0960-45. 2. Small subchorionic hemorrhage. Electronically Signed   By: Signa Kell M.D.   On: 09/21/2015 00:00     MAU Course  Procedures  MDM   Assessment and Plan   1. Pelvic pain affecting pregnancy in first trimester, antepartum    DC home Comfort measures reviewed  1st Trimester precautions  Bleeding precautions RX: none  Return to MAU as needed FU with OB as planned  Follow-up Information    Schedule an appointment as soon as possible for a visit with [facility].   Contact information:   [address] [place] [address] [phone]        Tawnya Crook 09/20/2015, 10:50 PM

## 2015-09-20 NOTE — MAU Note (Signed)
Pt presents complaining of lower and mid abdominal pain. Also having breast pain. LMP end of December. Hx of ovarian cysts. Denies abnormal bleeding or discharge. Has not tried pain medication

## 2015-09-21 DIAGNOSIS — O26891 Other specified pregnancy related conditions, first trimester: Secondary | ICD-10-CM

## 2015-09-21 DIAGNOSIS — R102 Pelvic and perineal pain: Secondary | ICD-10-CM

## 2015-09-21 LAB — RPR: RPR Ser Ql: NONREACTIVE

## 2015-09-21 LAB — HCG, QUANTITATIVE, PREGNANCY: HCG, BETA CHAIN, QUANT, S: 3722 m[IU]/mL — AB (ref <5)

## 2015-09-21 LAB — GC/CHLAMYDIA PROBE AMP (~~LOC~~) NOT AT ARMC
CHLAMYDIA, DNA PROBE: NEGATIVE
NEISSERIA GONORRHEA: NEGATIVE

## 2015-09-21 LAB — ABO/RH: ABO/RH(D): O POS

## 2015-09-21 LAB — HIV ANTIBODY (ROUTINE TESTING W REFLEX): HIV Screen 4th Generation wRfx: NONREACTIVE

## 2015-09-21 NOTE — Discharge Instructions (Signed)
Prenatal Care Providers °[facility]    [facility] ° Phone- [phone]     Phone: [phone] °         °[facility]                      [facility] ° @[place]     Phone: [phone] ° Phone: [phone] °        [facility] °[facility]     Phone: [phone] ° Phone: [phone]   °        [facility] °[facility] [place]                hone: [phone] ° Phone: [phone] °        [facility] °Dr. Bernard Marshall      Phone: [phone] ° Phone: [phone] °        [facility] °[facility]                Phone: [phone] ° Women’s Health  ° Phone:[phone]    [facility] ([place]) °         Phone: [phone] °[facility] °  Phone: [phone] °Safe Medications in Pregnancy  ° °Acne: °Benzoyl Peroxide °Salicylic Acid ° °Backache/Headache: °Tylenol: 2 regular strength every 4 hours OR °             2 Extra strength every 6 hours ° °Colds/Coughs/Allergies: °Benadryl (alcohol free) 25 mg every 6 hours as needed °Breath right strips °Claritin °Cepacol throat lozenges °Chloraseptic throat spray °Cold-Eeze- up to three times per day °Cough drops, alcohol free °Flonase (by prescription only) °Guaifenesin °Mucinex °Robitussin DM (plain only, alcohol free) °Saline nasal spray/drops °Sudafed (pseudoephedrine) & Actifed ** use only after [redacted] weeks gestation and if you do not have high blood pressure °Tylenol °Vicks Vaporub °Zinc lozenges °Zyrtec  ° °Constipation: °Colace °Ducolax suppositories °Fleet enema °Glycerin suppositories °Metamucil °Milk of magnesia °Miralax °Senokot °Smooth move tea ° °Diarrhea: °Kaopectate °Imodium A-D ° °*NO pepto Bismol ° °Hemorrhoids: °Anusol °Anusol HC °Preparation H °Tucks ° °Indigestion: °Tums °Maalox °Mylanta °Zantac  °Pepcid ° °Insomnia: °Benadryl (alcohol free) 25mg every 6 hours as needed °Tylenol  PM °Unisom, no Gelcaps ° °Leg Cramps: °Tums °MagGel ° °Nausea/Vomiting:  °Bonine °Dramamine °Emetrol °Ginger extract °Sea bands °Meclizine  °Nausea medication to take during pregnancy:  °Unisom (doxylamine succinate 25 mg tablets) Take one tablet daily at bedtime. If symptoms are not adequately controlled, the dose can be increased to a maximum recommended dose of two tablets daily (1/2 tablet in the morning, 1/2 tablet mid-afternoon and one at bedtime). °Vitamin B6 100mg tablets. Take one tablet twice a day (up to 200 mg per day). ° °Skin Rashes: °Aveeno products °Benadryl cream or 25mg every 6 hours as needed °Calamine Lotion °1% cortisone cream ° °Yeast infection: °Gyne-lotrimin 7 °Monistat 7 ° ° °**If taking multiple medications, please check labels to avoid duplicating the same active ingredients °**take medication as directed on the label °** Do not exceed 4000 mg of tylenol in 24 hours °**Do not take medications that contain aspirin or ibuprofen ° ° ° ° °First Trimester of Pregnancy °The first trimester of pregnancy is from week 1 until the end of week 12 (months 1 through 3). A week after a sperm fertilizes an egg, the egg will implant on the wall of the uterus. This   embryo will begin to develop into a baby. Genes from you and your partner are forming the baby. The female genes determine whether the baby is a boy or a girl. At 6-8 weeks, the eyes and face are formed, and the heartbeat can be seen on ultrasound. At the end of 12 weeks, all the baby's organs are formed.  °Now that you are pregnant, you will want to do everything you can to have a healthy baby. Two of the most important things are to get good prenatal care and to follow your health care provider's instructions. Prenatal care is all the medical care you receive before the baby's birth. This care will help prevent, find, and treat any problems during the pregnancy and childbirth. °BODY CHANGES °Your body goes through many changes during pregnancy.  The changes vary from woman to woman.  °· You may gain or lose a couple of pounds at first. °· You may feel sick to your stomach (nauseous) and throw up (vomit). If the vomiting is uncontrollable, call your health care provider. °· You may tire easily. °· You may develop headaches that can be relieved by medicines approved by your health care provider. °· You may urinate more often. Painful urination may mean you have a bladder infection. °· You may develop heartburn as a result of your pregnancy. °· You may develop constipation because certain hormones are causing the muscles that push waste through your intestines to slow down. °· You may develop hemorrhoids or swollen, bulging veins (varicose veins). °· Your breasts may begin to grow larger and become tender. Your nipples may stick out more, and the tissue that surrounds them (areola) may become darker. °· Your gums may bleed and may be sensitive to brushing and flossing. °· Dark spots or blotches (chloasma, mask of pregnancy) may develop on your face. This will likely fade after the baby is born. °· Your menstrual periods will stop. °· You may have a loss of appetite. °· You may develop cravings for certain kinds of food. °· You may have changes in your emotions from day to day, such as being excited to be pregnant or being concerned that something may go wrong with the pregnancy and baby. °· You may have more vivid and strange dreams. °· You may have changes in your hair. These can include thickening of your hair, rapid growth, and changes in texture. Some women also have hair loss during or after pregnancy, or hair that feels dry or thin. Your hair will most likely return to normal after your baby is born. °WHAT TO EXPECT AT YOUR PRENATAL VISITS °During a routine prenatal visit: °· You will be weighed to make sure you and the baby are growing normally. °· Your blood pressure will be taken. °· Your abdomen will be measured to track your baby's growth. °· The  fetal heartbeat will be listened to starting around week 10 or 12 of your pregnancy. °· Test results from any previous visits will be discussed. °Your health care provider may ask you: °· How you are feeling. °· If you are feeling the baby move. °· If you have had any abnormal symptoms, such as leaking fluid, bleeding, severe headaches, or abdominal cramping. °· If you are using any tobacco products, including cigarettes, chewing tobacco, and electronic cigarettes. °· If you have any questions. °Other tests that may be performed during your first trimester include: °· Blood tests to find your blood type and to check for the presence of any previous   infections. They will also be used to check for low iron levels (anemia) and Rh antibodies. Later in the pregnancy, blood tests for diabetes will be done along with other tests if problems develop. °· Urine tests to check for infections, diabetes, or protein in the urine. °· An ultrasound to confirm the proper growth and development of the baby. °· An amniocentesis to check for possible genetic problems. °· Fetal screens for spina bifida and Down syndrome. °· You may need other tests to make sure you and the baby are doing well. °· HIV (human immunodeficiency virus) testing. Routine prenatal testing includes screening for HIV, unless you choose not to have this test. °HOME CARE INSTRUCTIONS  °Medicines °· Follow your health care provider's instructions regarding medicine use. Specific medicines may be either safe or unsafe to take during pregnancy. °· Take your prenatal vitamins as directed. °· If you develop constipation, try taking a stool softener if your health care provider approves. °Diet °· Eat regular, well-balanced meals. Choose a variety of foods, such as meat or vegetable-based protein, fish, milk and low-fat dairy products, vegetables, fruits, and whole grain breads and cereals. Your health care provider will help you determine the amount of weight gain that  is right for you. °· Avoid raw meat and uncooked cheese. These carry germs that can cause birth defects in the baby. °· Eating four or five small meals rather than three large meals a day may help relieve nausea and vomiting. If you start to feel nauseous, eating a few soda crackers can be helpful. Drinking liquids between meals instead of during meals also seems to help nausea and vomiting. °· If you develop constipation, eat more high-fiber foods, such as fresh vegetables or fruit and whole grains. Drink enough fluids to keep your urine clear or pale yellow. °Activity and Exercise °· Exercise only as directed by your health care provider. Exercising will help you: °¨ Control your weight. °¨ Stay in shape. °¨ Be prepared for labor and delivery. °· Experiencing pain or cramping in the lower abdomen or low back is a good sign that you should stop exercising. Check with your health care provider before continuing normal exercises. °· Try to avoid standing for long periods of time. Move your legs often if you must stand in one place for a long time. °· Avoid heavy lifting. °· Wear low-heeled shoes, and practice good posture. °· You may continue to have sex unless your health care provider directs you otherwise. °Relief of Pain or Discomfort °· Wear a good support bra for breast tenderness.   °· Take warm sitz baths to soothe any pain or discomfort caused by hemorrhoids. Use hemorrhoid cream if your health care provider approves.   °· Rest with your legs elevated if you have leg cramps or low back pain. °· If you develop varicose veins in your legs, wear support hose. Elevate your feet for 15 minutes, 3-4 times a day. Limit salt in your diet. °Prenatal Care °· Schedule your prenatal visits by the twelfth week of pregnancy. They are usually scheduled monthly at first, then more often in the last 2 months before delivery. °· Write down your questions. Take them to your prenatal visits. °· Keep all your prenatal visits as  directed by your health care provider. °Safety °· Wear your seat belt at all times when driving. °· Make a list of emergency phone numbers, including numbers for family, friends, the hospital, and police and fire departments. °General Tips °· Ask your health care   provider for a referral to a local prenatal education class. Begin classes no later than at the beginning of month 6 of your pregnancy. °· Ask for help if you have counseling or nutritional needs during pregnancy. Your health care provider can offer advice or refer you to specialists for help with various needs. °· Do not use hot tubs, steam rooms, or saunas. °· Do not douche or use tampons or scented sanitary pads. °· Do not cross your legs for long periods of time. °· Avoid cat litter boxes and soil used by cats. These carry germs that can cause birth defects in the baby and possibly loss of the fetus by miscarriage or stillbirth. °· Avoid all smoking, herbs, alcohol, and medicines not prescribed by your health care provider. Chemicals in these affect the formation and growth of the baby. °· Do not use any tobacco products, including cigarettes, chewing tobacco, and electronic cigarettes. If you need help quitting, ask your health care provider. You may receive counseling support and other resources to help you quit. °· Schedule a dentist appointment. At home, brush your teeth with a soft toothbrush and be gentle when you floss. °SEEK MEDICAL CARE IF:  °· You have dizziness. °· You have mild pelvic cramps, pelvic pressure, or nagging pain in the abdominal area. °· You have persistent nausea, vomiting, or diarrhea. °· You have a bad smelling vaginal discharge. °· You have pain with urination. °· You notice increased swelling in your face, hands, legs, or ankles. °SEEK IMMEDIATE MEDICAL CARE IF:  °· You have a fever. °· You are leaking fluid from your vagina. °· You have spotting or bleeding from your vagina. °· You have severe abdominal cramping or  pain. °· You have rapid weight gain or loss. °· You vomit blood or material that looks like coffee grounds. °· You are exposed to German measles and have never had them. °· You are exposed to fifth disease or chickenpox. °· You develop a severe headache. °· You have shortness of breath. °· You have any kind of trauma, such as from a fall or a car accident. °  °This information is not intended to replace advice given to you by your health care provider. Make sure you discuss any questions you have with your health care provider. °  °Document Released: 08/01/2001 Document Revised: 08/28/2014 Document Reviewed: 06/17/2013 °[employer] Interactive Patient Education ©2016 [employer] ° °

## 2015-09-29 LAB — OB RESULTS CONSOLE GC/CHLAMYDIA
CHLAMYDIA, DNA PROBE: NEGATIVE
Gonorrhea: NEGATIVE

## 2015-09-29 LAB — OB RESULTS CONSOLE ABO/RH: RH Type: POSITIVE

## 2015-09-29 LAB — OB RESULTS CONSOLE RPR: RPR: NONREACTIVE

## 2015-09-29 LAB — OB RESULTS CONSOLE HEPATITIS B SURFACE ANTIGEN: Hepatitis B Surface Ag: NEGATIVE

## 2015-09-29 LAB — OB RESULTS CONSOLE HIV ANTIBODY (ROUTINE TESTING): HIV: NONREACTIVE

## 2015-09-29 LAB — OB RESULTS CONSOLE RUBELLA ANTIBODY, IGM: RUBELLA: IMMUNE

## 2015-09-29 LAB — OB RESULTS CONSOLE ANTIBODY SCREEN: ANTIBODY SCREEN: NEGATIVE

## 2015-09-30 LAB — OB RESULTS CONSOLE GBS: GBS: POSITIVE

## 2015-10-09 DIAGNOSIS — Z22330 Carrier of Group B streptococcus: Secondary | ICD-10-CM | POA: Insufficient documentation

## 2015-11-08 ENCOUNTER — Encounter (HOSPITAL_COMMUNITY): Payer: Self-pay | Admitting: *Deleted

## 2015-11-08 ENCOUNTER — Inpatient Hospital Stay (HOSPITAL_COMMUNITY)
Admission: AD | Admit: 2015-11-08 | Discharge: 2015-11-08 | Disposition: A | Discharge status: Home / Self Care | Payer: Medicaid Other | Source: Ambulatory Visit | Attending: Obstetrics and Gynecology | Admitting: Obstetrics and Gynecology

## 2015-11-08 DIAGNOSIS — J069 Acute upper respiratory infection, unspecified: Secondary | ICD-10-CM | POA: Insufficient documentation

## 2015-11-08 DIAGNOSIS — R05 Cough: Secondary | ICD-10-CM | POA: Insufficient documentation

## 2015-11-08 DIAGNOSIS — Z880 Allergy status to penicillin: Secondary | ICD-10-CM | POA: Insufficient documentation

## 2015-11-08 DIAGNOSIS — O99511 Diseases of the respiratory system complicating pregnancy, first trimester: Secondary | ICD-10-CM | POA: Diagnosis not present

## 2015-11-08 DIAGNOSIS — Z87891 Personal history of nicotine dependence: Secondary | ICD-10-CM | POA: Insufficient documentation

## 2015-11-08 DIAGNOSIS — Z3A11 11 weeks gestation of pregnancy: Secondary | ICD-10-CM | POA: Insufficient documentation

## 2015-11-08 DIAGNOSIS — O26891 Other specified pregnancy related conditions, first trimester: Secondary | ICD-10-CM | POA: Diagnosis not present

## 2015-11-08 NOTE — Discharge Instructions (Signed)
Upper Respiratory Infection, Adult Most upper respiratory infections (URIs) are a viral infection of the air passages leading to the lungs. A URI affects the nose, throat, and upper air passages. The most common type of URI is nasopharyngitis and is typically referred to as "the common cold." URIs run their course and usually go away on their own. Most of the time, a URI does not require medical attention, but sometimes a bacterial infection in the upper airways can follow a viral infection. This is called a secondary infection. Sinus and middle ear infections are common types of secondary upper respiratory infections. Bacterial pneumonia can also complicate a URI. A URI can worsen asthma and chronic obstructive pulmonary disease (COPD). Sometimes, these complications can require emergency medical care and may be life threatening.  CAUSES Almost all URIs are caused by viruses. A virus is a type of germ and can spread from one person to another.  RISKS FACTORS You may be at risk for a URI if:   You smoke.   You have chronic heart or lung disease.  You have a weakened defense (immune) system.   You are very young or very old.   You have nasal allergies or asthma.  You work in crowded or poorly ventilated areas.  You work in health care facilities or schools. SIGNS AND SYMPTOMS  Symptoms typically develop 2-3 days after you come in contact with a cold virus. Most viral URIs last 7-10 days. However, viral URIs from the influenza virus (flu virus) can last 14-18 days and are typically more severe. Symptoms may include:   Runny or stuffy (congested) nose.   Sneezing.   Cough.   Sore throat.   Headache.   Fatigue.   Fever.   Loss of appetite.   Pain in your forehead, behind your eyes, and over your cheekbones (sinus pain).  Muscle aches.  DIAGNOSIS  Your health care provider may diagnose a URI by:  Physical exam.  Tests to check that your symptoms are not due to  another condition such as:  Strep throat.  Sinusitis.  Pneumonia.  Asthma. TREATMENT  A URI goes away on its own with time. It cannot be cured with medicines, but medicines may be prescribed or recommended to relieve symptoms. Medicines may help:  Reduce your fever.  Reduce your cough.  Relieve nasal congestion. HOME CARE INSTRUCTIONS   Take medicines only as directed by your health care provider.   Gargle warm saltwater or take cough drops to comfort your throat as directed by your health care provider.  Use a warm mist humidifier or inhale steam from a shower to increase air moisture. This may make it easier to breathe.  Drink enough fluid to keep your urine clear or pale yellow.   Eat soups and other clear broths and maintain good nutrition.   Rest as needed.   Return to work when your temperature has returned to normal or as your health care provider advises. You may need to stay home longer to avoid infecting others. You can also use a face mask and careful hand washing to prevent spread of the virus.  Increase the usage of your inhaler if you have asthma.   Do not use any tobacco products, including cigarettes, chewing tobacco, or electronic cigarettes. If you need help quitting, ask your health care provider. PREVENTION  The best way to protect yourself from getting a cold is to practice good hygiene.   Avoid oral or hand contact with people with cold   symptoms.   Wash your hands often if contact occurs.  There is no clear evidence that vitamin C, vitamin E, echinacea, or exercise reduces the chance of developing a cold. However, it is always recommended to get plenty of rest, exercise, and practice good nutrition.  SEEK MEDICAL CARE IF:   You are getting worse rather than better.   Your symptoms are not controlled by medicine.   You have chills.  You have worsening shortness of breath.  You have brown or red mucus.  You have yellow or brown nasal  discharge.  You have pain in your face, especially when you bend forward.  You have a fever.  You have swollen neck glands.  You have pain while swallowing.  You have white areas in the back of your throat. SEEK IMMEDIATE MEDICAL CARE IF:   You have severe or persistent:  Headache.  Ear pain.  Sinus pain.  Chest pain.  You have chronic lung disease and any of the following:  Wheezing.  Prolonged cough.  Coughing up blood.  A change in your usual mucus.  You have a stiff neck.  You have changes in your:  Vision.  Hearing.  Thinking.  Mood. MAKE SURE YOU:   Understand these instructions.  Will watch your condition.  Will get help right away if you are not doing well or get worse.   This information is not intended to replace advice given to you by your health care provider. Make sure you discuss any questions you have with your health care provider.   Document Released: 01/31/2001 Document Revised: 12/22/2014 Document Reviewed: 11/12/2013 Elsevier Interactive Patient Education 2016 Elsevier Inc[employer]  

## 2015-11-08 NOTE — MAU Note (Signed)
Pt reports sneezing and runny nose as well as coughing and sore throat.  Has not taken anything for her sore throat.

## 2015-11-08 NOTE — MAU Note (Signed)
Pt reports sore throat and cough since yesterday.  °

## 2015-11-08 NOTE — Progress Notes (Signed)
History    Maria [name],Maria [name] is a 25yo, G1P0, at 11.3wks that presents to [facility]U unannounced for cough, sore throat, sneezing and runny nose that started last night.  States she called the office earlier today and was advised to get an [facility].  Per pt report, attempted to go to a [facility] but they were closing and as a result, presented to [facility]. Denies fever or chills. Cough non productive.   Has not taken anything for her sore throat, cough or runny nose.    There are no active problems to display for this patient.   No chief complaint on file.  HPI  OB History    Gravida Para Term Preterm AB TAB SAB Ectopic Multiple Living        Past Medical History  Diagnosis Date  . Acute pancreatitis   . Ovarian cyst   . Asthma     Past Surgical History  Procedure Laterality Date  . Tonsillectomy    . Wisdom tooth extraction      Family History  Problem Relation Age of Onset  . Hypertension Maternal Grandmother   . Heart disease Maternal Grandfather   . Stroke Maternal Grandfather   . Diabetes Neg Hx   . Cancer Neg Hx     Social History  Substance Use Topics  . Smoking status: Former Smoker -- 0.50 packs/day    Types: Cigarettes    Quit date: 11/07/2013  . Smokeless tobacco: Never Used  . Alcohol Use: 0.6 oz/week    1 Cans of beer per week     Comment: occasional     Allergies:  Allergies  Allergen Reactions  . Penicillins Rash    Has patient had a PCN reaction causing immediate rash, facial/tongue/throat swelling, SOB or lightheadedness with hypotension: Yes Has patient had a PCN reaction causing severe rash involving mucus membranes or skin necrosis: Yes Has patient had a PCN reaction that required hospitalization Yes Has patient had a PCN reaction occurring within the last 10 years: No If all of the above answers are "NO", then may proceed with Cephalosporin use.     No prescriptions prior to admission     ROS Physical Exam   Blood pressure 108/52, pulse 84, temperature 98.4 F (36.9 C), temperature source Oral, resp. rate 16, height  (1.549 m), weight 60.328 kg (133 lb), last menstrual period 08/20/2015, SpO2 99 %.  Filed Vitals:   11/08/15 2036 11/08/15 2310  BP: 108/52   Pulse: 84   Temp: 98.5 F (36.9 C) 98.4 F (36.9 C)  TempSrc: Oral Oral  Resp: 18 16  Height:  (1.549 m)   Weight: 60.328 kg (133 lb)   SpO2: 99%      No results found for this or any previous visit (from the past 24 hour(s)).    Physical Exam  Constitutional: She is oriented to person, place, and time. She appears well-developed and well-nourished.  HENT:  Head: Normocephalic.  Eyes: Pupils are equal, round, and reactive to light.  Neck: Normal range of motion.  Cardiovascular: Normal rate.   Respiratory: Effort normal.  Musculoskeletal: Normal range of motion.  Neurological: She is alert and oriented to person, place, and time. She has normal reflexes.  Skin: Skin is warm.  Psychiatric: She has a normal mood and affect. Her behavior is normal. Judgment and thought content normal.    ED Course  Assessment: Upper respiratory infection  Afebrile Non productive cough   Plan: Culture, Group A strep-   Pending Influenza panel -  Pending DC home May take Robitussin, Mucisnex or Delsym for  Utilize cough drops, hot tea, chloraseptic spray for sore throat Fluid and rest recommended Call prn Keep scheduled OB appointment  Alphonzo Severanceachel Raymon Schlarb CNM, MSN 11/09/2015 3:06 AM

## 2015-11-09 LAB — INFLUENZA PANEL BY PCR (TYPE A & B)
H1N1FLUPCR: NOT DETECTED
INFLAPCR: NEGATIVE
INFLBPCR: NEGATIVE

## 2015-11-13 LAB — CULTURE, GROUP A STREP (THRC)

## 2016-01-10 IMAGING — CR DG ANKLE COMPLETE 3+V*L*
1 series · 3 of 3 positions shown · non-contrast
Comparison: None.

CLINICAL DATA: Left ankle pain, lateral malleolus pain, injury last
night post fall

EXAM:
LEFT ANKLE COMPLETE - 3+ VIEW

[Series 1: ap · 0.17mm/px · 3 of 3 slices shown]
[im 1/3]
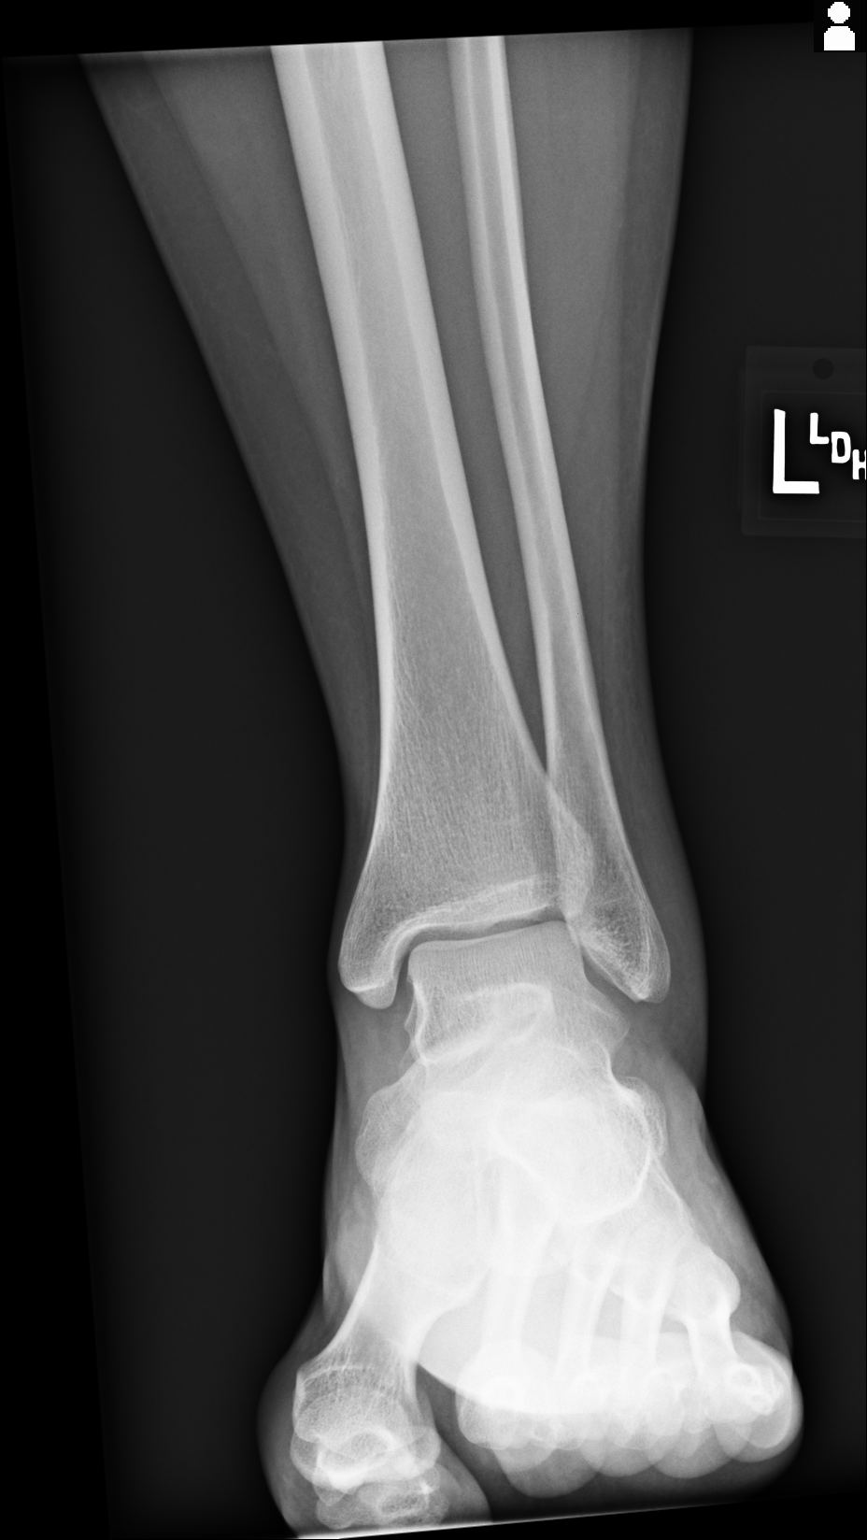
[im 2/3]
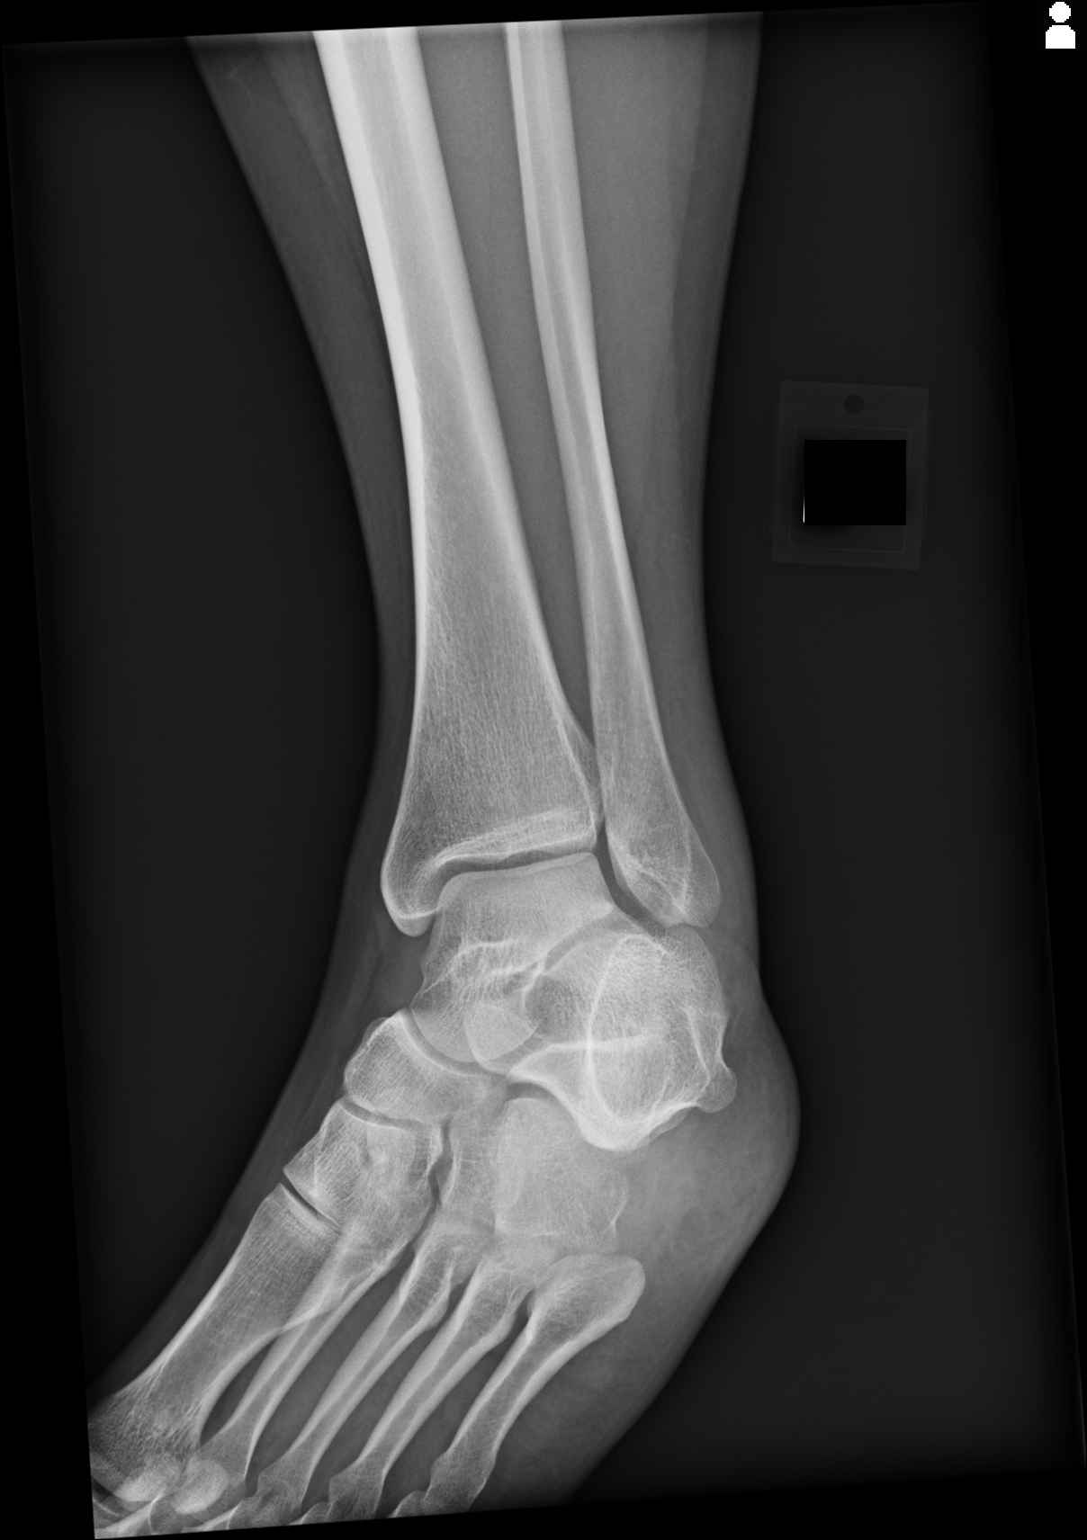
[im 3/3]
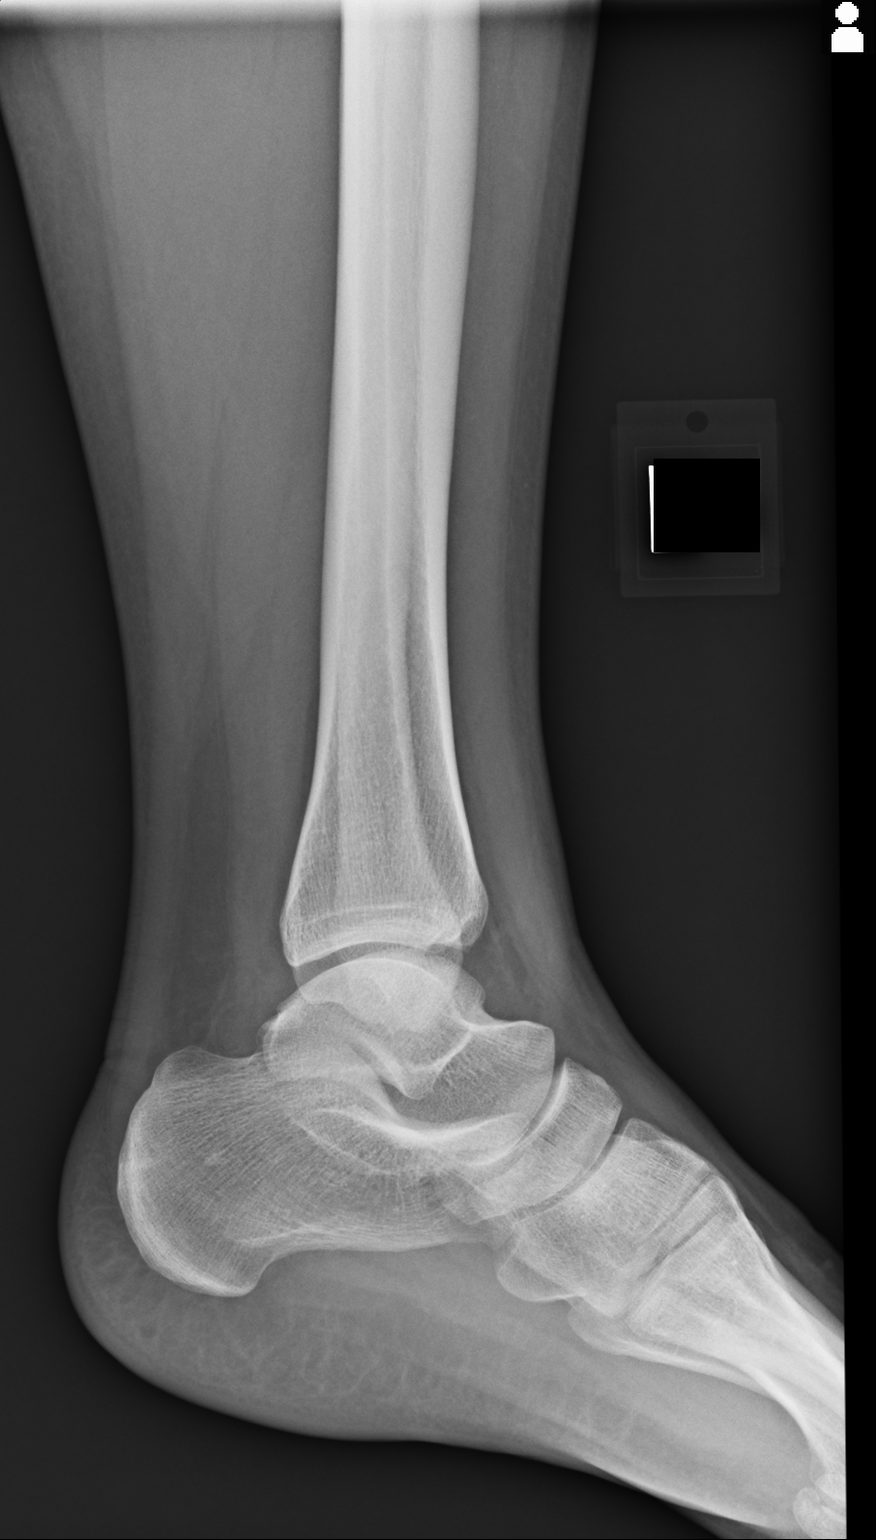

[3 of 3 positions shown; findings below may reference images not displayed]

FINDINGS: Three views of left ankle submitted. No acute fracture or
subluxation. Ankle mortise is preserved. There is soft tissue
swelling adjacent to lateral malleolus.
IMPRESSION: No acute fracture or subluxation.  Lateral soft tissue swelling.

## 2016-01-31 ENCOUNTER — Inpatient Hospital Stay (HOSPITAL_COMMUNITY)
Admission: AD | Admit: 2016-01-31 | Discharge: 2016-01-31 | Disposition: A | Discharge status: Home / Self Care | Payer: Medicaid Other | Source: Ambulatory Visit | Attending: Obstetrics and Gynecology | Admitting: Obstetrics and Gynecology

## 2016-01-31 ENCOUNTER — Encounter (HOSPITAL_COMMUNITY): Payer: Self-pay | Admitting: *Deleted

## 2016-01-31 DIAGNOSIS — O26899 Other specified pregnancy related conditions, unspecified trimester: Secondary | ICD-10-CM

## 2016-01-31 DIAGNOSIS — Z3A23 23 weeks gestation of pregnancy: Secondary | ICD-10-CM | POA: Diagnosis not present

## 2016-01-31 DIAGNOSIS — B3731 Acute candidiasis of vulva and vagina: Secondary | ICD-10-CM

## 2016-01-31 DIAGNOSIS — B373 Candidiasis of vulva and vagina: Secondary | ICD-10-CM | POA: Insufficient documentation

## 2016-01-31 DIAGNOSIS — Z87891 Personal history of nicotine dependence: Secondary | ICD-10-CM | POA: Insufficient documentation

## 2016-01-31 DIAGNOSIS — O98812 Other maternal infectious and parasitic diseases complicating pregnancy, second trimester: Secondary | ICD-10-CM | POA: Diagnosis not present

## 2016-01-31 DIAGNOSIS — R102 Pelvic and perineal pain: Secondary | ICD-10-CM | POA: Insufficient documentation

## 2016-01-31 DIAGNOSIS — Z880 Allergy status to penicillin: Secondary | ICD-10-CM | POA: Insufficient documentation

## 2016-01-31 HISTORY — DX: Urinary tract infection, site not specified: N39.0

## 2016-01-31 HISTORY — DX: Carbuncle, unspecified: L02.93

## 2016-01-31 HISTORY — DX: Furuncle, unspecified: L02.92

## 2016-01-31 LAB — URINALYSIS, ROUTINE W REFLEX MICROSCOPIC
Bilirubin Urine: NEGATIVE
GLUCOSE, UA: NEGATIVE mg/dL
HGB URINE DIPSTICK: NEGATIVE
Ketones, ur: NEGATIVE mg/dL
Leukocytes, UA: NEGATIVE
Nitrite: NEGATIVE
Protein, ur: NEGATIVE mg/dL
SPECIFIC GRAVITY, URINE: 1.025 (ref 1.005–1.030)
pH: 6 (ref 5.0–8.0)

## 2016-01-31 LAB — WET PREP, GENITAL
Clue Cells Wet Prep HPF POC: NONE SEEN
SPERM: NONE SEEN
Trich, Wet Prep: NONE SEEN
YEAST WET PREP: NONE SEEN

## 2016-01-31 MED ORDER — ONDANSETRON 4 MG PO TBDP
4.0000 mg | ORAL_TABLET | Freq: Once | ORAL | Status: AC
  Administered 2016-01-31: 4 mg via ORAL
  Filled 2016-01-31: qty 1

## 2016-01-31 MED ORDER — IBUPROFEN 600 MG PO TABS
600.0000 mg | ORAL_TABLET | Freq: Once | ORAL | Status: AC
  Administered 2016-01-31: 600 mg via ORAL
  Filled 2016-01-31: qty 1

## 2016-01-31 MED ORDER — TERCONAZOLE 0.4 % VA CREA: 1.0000 | TOPICAL_CREAM | Freq: Every day | VAGINAL | Status: DC

## 2016-01-31 NOTE — Discharge Instructions (Signed)
Monilial Vaginitis Vaginitis in a soreness, swelling and redness (inflammation) of the vagina and vulva. Monilial vaginitis is not a sexually transmitted infection. CAUSES  Yeast vaginitis is caused by yeast (candida) that is normally found in your vagina. With a yeast infection, the candida has overgrown in number to a point that upsets the chemical balance. SYMPTOMS   White, thick vaginal discharge.  Swelling, itching, redness and irritation of the vagina and possibly the lips of the vagina (vulva).  Burning or painful urination.  Painful intercourse. DIAGNOSIS  Things that may contribute to monilial vaginitis are:  Postmenopausal and virginal states.  Pregnancy.  Infections.  Being tired, sick or stressed, especially if you had monilial vaginitis in the past.  Diabetes. Good control will help lower the chance.  Birth control pills.  Tight fitting garments.  Using bubble bath, feminine sprays, douches or deodorant tampons.  Taking certain medications that kill germs (antibiotics).  Sporadic recurrence can occur if you become ill. TREATMENT  Your caregiver will give you medication.  There are several kinds of anti monilial vaginal creams and suppositories specific for monilial vaginitis. For recurrent yeast infections, use a suppository or cream in the vagina 2 times a week, or as directed.  Anti-monilial or steroid cream for the itching or irritation of the vulva may also be used. Get your caregiver's permission.  Painting the vagina with methylene blue solution may help if the monilial cream does not work.  Eating yogurt may help prevent monilial vaginitis. HOME CARE INSTRUCTIONS   Finish all medication as prescribed.  Do not have sex until treatment is completed or after your caregiver tells you it is okay.  Take warm sitz baths.  Do not douche.  Do not use tampons, especially scented ones.  Wear cotton underwear.  Avoid tight pants and panty  hose.  Tell your sexual partner that you have a yeast infection. They should go to their caregiver if they have symptoms such as mild rash or itching.  Your sexual partner should be treated as well if your infection is difficult to eliminate.  Practice safer sex. Use condoms.  Some vaginal medications cause latex condoms to fail. Vaginal medications that harm condoms are:  Cleocin cream.  Butoconazole (Femstat).  Terconazole (Terazol) vaginal suppository.  Miconazole (Monistat) (may be purchased over the counter). SEEK MEDICAL CARE IF:   You have a temperature by mouth above 102 F (38.9 C).  The infection is getting worse after 2 days of treatment.  The infection is not getting better after 3 days of treatment.  You develop blisters in or around your vagina.  You develop vaginal bleeding, and it is not your menstrual period.  You have pain when you urinate.  You develop intestinal problems.  You have pain with sexual intercourse.   This information is not intended to replace advice given to you by your health care provider. Make sure you discuss any questions you have with your health care provider.   Document Released: 05/17/2005 Document Revised: 10/30/2011 Document Reviewed: 02/08/2015 Elsevier Interactive Patient Education 2016 Elsevier Inc. Round Ligament Pain The round ligament is a cord of muscle and tissue that helps to support the uterus. It can become a source of pain during pregnancy if it becomes stretched or twisted as the baby grows. The pain usually begins in the second trimester of pregnancy, and it can come and go until the baby is delivered. It is not a serious problem, and it does not cause harm to  the baby. Round ligament pain is usually a short, sharp, and pinching pain, but it can also be a dull, lingering, and aching pain. The pain is felt in the lower side of the abdomen or in the groin. It usually starts deep in the groin and moves up to the  outside of the hip area. Pain can occur with:  A sudden change in position.  Rolling over in bed.  Coughing or sneezing.  Physical activity. HOME CARE INSTRUCTIONS Watch your condition for any changes. Take these steps to help with your pain:  When the pain starts, relax. Then try:  Sitting down.  Flexing your knees up to your abdomen.  Lying on your side with one pillow under your abdomen and another pillow between your legs.  Sitting in a warm bath for 15-20 minutes or until the pain goes away.  Take over-the-counter and prescription medicines only as told by your health care provider.  Move slowly when you sit and stand.  Avoid long walks if they cause pain.  Stop or lessen your physical activities if they cause pain. SEEK MEDICAL CARE IF:  Your pain does not go away with treatment.  You feel pain in your back that you did not have before.  Your medicine is not helping. SEEK IMMEDIATE MEDICAL CARE IF:  You develop a fever or chills.  You develop uterine contractions.  You develop vaginal bleeding.  You develop nausea or vomiting.  You develop diarrhea.  You have pain when you urinate.   This information is not intended to replace advice given to you by your health care provider. Make sure you discuss any questions you have with your health care provider.   Document Released: 05/16/2008 Document Revised: 10/30/2011 Document Reviewed: 10/14/2014 Elsevier Interactive Patient Education [employer]

## 2016-01-31 NOTE — MAU Note (Signed)
Pt presents complaining of sharp pain around her umbilicus that radiates up. She was working outside and got really hot when the pain started. Denies vaginal bleeding or leaking of fluid. Reports good fetal movement. Has not tried anything for the pain. Fetal movement makes the pain worse.

## 2016-01-31 NOTE — MAU Provider Note (Signed)
Maria [name], Maria [name] is a 24yo, G1P0 at 23.4 wks presenting to [facility]U unannounced for sharp "stabbing pains" in her lower pelvis that worsen with movement.  Denies vaginal bleeding, lof , ctx.  Reports +FM   History     Patient Active Problem List   Diagnosis Date Noted  . Pain of round ligament affecting pregnancy, antepartum 02/01/2016  . Yeast infection of the vagina 02/01/2016    Chief Complaint  Patient presents with  . Abdominal Pain   HPI  OB History    Gravida Para Term Preterm AB TAB SAB Ectopic Multiple Living        Past Medical History  Diagnosis Date  . Acute pancreatitis   . Ovarian cyst   . Asthma   . Recurrent boils     legs and buttocks.  staff infection, was neg  . UTI (urinary tract infection)     Past Surgical History  Procedure Laterality Date  . Tonsillectomy    . Wisdom tooth extraction      Family History  Problem Relation Age of Onset  . Hypertension Maternal Grandmother   . Heart disease Maternal Grandfather   . Stroke Maternal Grandfather   . Diabetes Neg Hx   . Cancer Neg Hx     Social History  Substance Use Topics  . Smoking status: Former Smoker -- 0.50 packs/day    Types: Cigarettes    Quit date: 11/07/2013  . Smokeless tobacco: Never Used  . Alcohol Use: 0.6 oz/week    1 Cans of beer per week     Comment: occasional not while preg    Allergies:  Allergies  Allergen Reactions  . Penicillins Rash    Has patient had a PCN reaction causing immediate rash, facial/tongue/throat swelling, SOB or lightheadedness with hypotension: Yes Has patient had a PCN reaction causing severe rash involving mucus membranes or skin necrosis: Yes Has patient had a PCN reaction that required hospitalization Yes Has patient had a PCN reaction occurring within the last 10 years: No If all of the above answers are "NO", then may proceed with Cephalosporin use.     No prescriptions prior to admission    Review of Systems   Constitutional: Negative.   HENT: Negative.   Eyes: Negative.   Respiratory: Negative.   Cardiovascular: Negative.   Gastrointestinal: Positive for abdominal pain.  Genitourinary: Negative.   Musculoskeletal:       Sharp pains with movement in lower pelvis  Skin: Negative.   Endo/Heme/Allergies: Negative for polydipsia.  Psychiatric/Behavioral: Negative.    Physical Exam   Blood pressure 113/64, pulse 89, temperature 98.1 F (36.7 C), temperature source Oral, resp. rate 16, height  (1.549 m), weight 68.947 kg (152 lb), last menstrual period 08/20/2015.   Results for orders placed or performed during the hospital encounter of 01/31/16 (from the past 24 hour(s))  Urinalysis, Routine w reflex microscopic (not at [facility])     Status: None   Collection Time: 01/31/16  7:13 PM  Result Value Ref Range   Color, Urine YELLOW YELLOW   APPearance CLEAR CLEAR   Specific Gravity, Urine 1.025 1.005 - 1.030   pH 6.0 5.0 - 8.0   Glucose, UA NEGATIVE NEGATIVE mg/dL   Hgb urine dipstick NEGATIVE NEGATIVE   Bilirubin Urine NEGATIVE NEGATIVE   Ketones, ur NEGATIVE NEGATIVE mg/dL   Protein, ur NEGATIVE NEGATIVE mg/dL   Nitrite NEGATIVE NEGATIVE   Leukocytes, UA NEGATIVE NEGATIVE  Wet prep, genital     Status: Abnormal   Collection Time: 01/31/16  8:40 PM  Result Value Ref Range   Yeast Wet Prep HPF POC NONE SEEN NONE SEEN   Trich, Wet Prep NONE SEEN NONE SEEN   Clue Cells Wet Prep HPF POC NONE SEEN NONE SEEN   WBC, Wet Prep HPF POC MODERATE (A) NONE SEEN   Sperm NONE SEEN    Physical Exam  Constitutional: She is oriented to person, place, and time. She appears well-developed and well-nourished.  HENT:  Head: Normocephalic.  Eyes: Pupils are equal, round, and reactive to light.  Neck: Normal range of motion.  Cardiovascular: Normal rate and regular rhythm.   Respiratory: Effort normal.  GI: Soft.  Genitourinary: Uterus is not tender. Cervix exhibits friability. Cervix exhibits no  motion tenderness and no discharge. Right adnexum displays no tenderness. Left adnexum displays no tenderness. Vaginal discharge found.  Thick copious amounts of white yeast like discharge  Musculoskeletal: Normal range of motion.  Neurological: She is alert and oriented to person, place, and time. She has normal reflexes.  Skin: Skin is warm and dry.  Psychiatric: She has a normal mood and affect. Her behavior is normal.     SVE:  Closed/thick FHT:  Reassuring for gestational age, 135 bpm, moderate viability, +accels, no decels  UC: none      ED Course  Assessment: IUP at 23.4wks Yeast infection Round ligament pain FHT Reassuring for gestational age  Plan: DC home in stable condition Rx Terazol 7  Tylenol for pain Discussed common discomforts of pregnancy Keep scheduled ROB Call PRN   Alphonzo SeveranceRachel Kaytlynn Kochan CNM, MSN 02/01/2016 2:06 AM

## 2016-01-31 NOTE — MAU Note (Signed)
Urine sent to lab 

## 2016-02-01 DIAGNOSIS — B3731 Acute candidiasis of vulva and vagina: Secondary | ICD-10-CM

## 2016-02-01 DIAGNOSIS — B373 Candidiasis of vulva and vagina: Secondary | ICD-10-CM

## 2016-02-01 DIAGNOSIS — O26899 Other specified pregnancy related conditions, unspecified trimester: Secondary | ICD-10-CM

## 2016-02-01 DIAGNOSIS — R102 Pelvic and perineal pain: Secondary | ICD-10-CM

## 2016-02-01 LAB — GC/CHLAMYDIA PROBE AMP (~~LOC~~) NOT AT ARMC
Chlamydia: NEGATIVE
Neisseria Gonorrhea: NEGATIVE

## 2016-04-28 ENCOUNTER — Encounter (HOSPITAL_COMMUNITY): Payer: Self-pay | Admitting: *Deleted

## 2016-04-28 ENCOUNTER — Inpatient Hospital Stay (HOSPITAL_COMMUNITY)
Admission: AD | Admit: 2016-04-28 | Discharge: 2016-04-28 | Disposition: A | Discharge status: Home / Self Care | Payer: Medicaid Other | Source: Ambulatory Visit | Attending: Obstetrics and Gynecology | Admitting: Obstetrics and Gynecology

## 2016-04-28 DIAGNOSIS — O26893 Other specified pregnancy related conditions, third trimester: Secondary | ICD-10-CM

## 2016-04-28 DIAGNOSIS — Z9889 Other specified postprocedural states: Secondary | ICD-10-CM | POA: Diagnosis not present

## 2016-04-28 DIAGNOSIS — Z87440 Personal history of urinary (tract) infections: Secondary | ICD-10-CM | POA: Insufficient documentation

## 2016-04-28 DIAGNOSIS — Z3A37 37 weeks gestation of pregnancy: Secondary | ICD-10-CM | POA: Diagnosis not present

## 2016-04-28 DIAGNOSIS — O2343 Unspecified infection of urinary tract in pregnancy, third trimester: Secondary | ICD-10-CM | POA: Insufficient documentation

## 2016-04-28 DIAGNOSIS — O99513 Diseases of the respiratory system complicating pregnancy, third trimester: Secondary | ICD-10-CM | POA: Insufficient documentation

## 2016-04-28 DIAGNOSIS — R12 Heartburn: Secondary | ICD-10-CM

## 2016-04-28 DIAGNOSIS — O3483 Maternal care for other abnormalities of pelvic organs, third trimester: Secondary | ICD-10-CM | POA: Insufficient documentation

## 2016-04-28 DIAGNOSIS — R301 Vesical tenesmus: Secondary | ICD-10-CM

## 2016-04-28 DIAGNOSIS — Z880 Allergy status to penicillin: Secondary | ICD-10-CM | POA: Insufficient documentation

## 2016-04-28 DIAGNOSIS — R350 Frequency of micturition: Secondary | ICD-10-CM | POA: Diagnosis present

## 2016-04-28 DIAGNOSIS — Z87891 Personal history of nicotine dependence: Secondary | ICD-10-CM | POA: Insufficient documentation

## 2016-04-28 DIAGNOSIS — O99353 Diseases of the nervous system complicating pregnancy, third trimester: Secondary | ICD-10-CM | POA: Diagnosis not present

## 2016-04-28 DIAGNOSIS — Z79899 Other long term (current) drug therapy: Secondary | ICD-10-CM | POA: Diagnosis not present

## 2016-04-28 DIAGNOSIS — O99613 Diseases of the digestive system complicating pregnancy, third trimester: Secondary | ICD-10-CM | POA: Insufficient documentation

## 2016-04-28 HISTORY — DX: Gastro-esophageal reflux disease without esophagitis: K21.9

## 2016-04-28 HISTORY — DX: Unspecified hemorrhoids: K64.9

## 2016-04-28 LAB — WET PREP, GENITAL
Clue Cells Wet Prep HPF POC: NONE SEEN
SPERM: NONE SEEN
TRICH WET PREP: NONE SEEN
YEAST WET PREP: NONE SEEN

## 2016-04-28 LAB — URINALYSIS, ROUTINE W REFLEX MICROSCOPIC
BILIRUBIN URINE: NEGATIVE
Glucose, UA: NEGATIVE mg/dL
HGB URINE DIPSTICK: NEGATIVE
Ketones, ur: NEGATIVE mg/dL
Leukocytes, UA: NEGATIVE
Nitrite: NEGATIVE
PH: 6 (ref 5.0–8.0)
Protein, ur: NEGATIVE mg/dL
SPECIFIC GRAVITY, URINE: 1.02 (ref 1.005–1.030)

## 2016-04-28 MED ORDER — ACETAMINOPHEN 325 MG PO TABS
650.0000 mg | ORAL_TABLET | Freq: Once | ORAL | Status: AC
  Administered 2016-04-28: 650 mg via ORAL
  Filled 2016-04-28: qty 2

## 2016-04-28 NOTE — MAU Provider Note (Signed)
History     CSN: [id number]  Arrival date and time: 04/28/16 1634   First Provider Initiated Contact with Patient 04/28/16 1736      Chief Complaint  Patient presents with  . Urinary Frequency  . Pelvic Pain   HPI RN note: [] Hover for attribution information C/o pelvic pain today; pain started @ 1200; c/o urinary frequency started today Pt is 25 y.o.G1P0000 @[redacted]w[redacted]d  who presents with hx of frequent UTIs.  Pt started having pain around bladder about 12 oclock.  Pt states she has pain when baby presses on her bladder.  Pt denies bleeding or leakage of fluid.  Pt states baby is active. Pt also states she has heartburn and has been prescribed Rx but afraid to take the medicine. Pt also has had back pain during her pregnancy. Pt has also had swelling in hands and feet- has carpal tunnel syndrome Past Medical History:  Diagnosis Date  . Acute pancreatitis   . Asthma   . GERD (gastroesophageal reflux disease)   . Hemorrhoid   . Ovarian cyst   . Recurrent boils    legs and buttocks.  staff infection, was neg  . UTI (urinary tract infection)     Past Surgical History:  Procedure Laterality Date  . TONSILLECTOMY    . WISDOM TOOTH EXTRACTION      Family History  Problem Relation Age of Onset  . Hypertension Maternal Grandmother   . Heart disease Maternal Grandfather   . Stroke Maternal Grandfather   . Diabetes Neg Hx   . Cancer Neg Hx     Social History  Substance Use Topics  . Smoking status: Former Smoker    Packs/day: 0.50    Types: Cigarettes    Quit date: 11/07/2013  . Smokeless tobacco: Former Neurosurgeon  . Alcohol use 0.6 oz/week    1 Cans of beer per week     Comment: occasional not while preg    Allergies:  Allergies  Allergen Reactions  . Penicillins Rash    Has patient had a PCN reaction causing immediate rash, facial/tongue/throat swelling, SOB or lightheadedness with hypotension: Yes Has patient had a PCN reaction causing severe rash involving mucus  membranes or skin necrosis: Yes Has patient had a PCN reaction that required hospitalization Yes Has patient had a PCN reaction occurring within the last 10 years: No If all of the above answers are "NO", then may proceed with Cephalosporin use.     Prescriptions Prior to Admission  Medication Sig Dispense Refill Last Dose  . albuterol (PROVENTIL HFA;VENTOLIN HFA) 108 (90 BASE) MCG/ACT inhaler Inhale 2 puffs into the lungs every 6 (six) hours as needed for wheezing or shortness of breath.   Rescue at Unknown time  . Doxylamine-Pyridoxine (DICLEGIS) 10-10 MG TBEC Take 1-2 tablets by mouth 3 (three) times daily as needed (For nausea.). Patient takes 2 tablets every night at bedtime and 1 tablet in the morning and afternoon as needed.   01/30/2016 at Unknown time  . Prenatal Vit-Fe Fumarate-FA (PRENATAL MULTIVITAMIN) TABS tablet Take 1 tablet by mouth daily at 12 noon.   01/31/2016 at Unknown time  . terconazole (TERAZOL 7) 0.4 % vaginal cream Place 1 applicator vaginally at bedtime. 45 g 0     Review of Systems  Constitutional: Negative for chills and fever.  Cardiovascular: Positive for leg swelling.  Gastrointestinal: Positive for abdominal pain, heartburn and nausea. Negative for constipation and diarrhea.  Genitourinary: Positive for dysuria. Negative for urgency.  Musculoskeletal: Positive for back  pain.  Neurological: Negative for headaches.   Physical Exam   Blood pressure 121/67, pulse 111, temperature 98.3 F (36.8 C), temperature source Oral, resp. rate 16, last menstrual period 08/20/2015.  Physical Exam  Nursing note and vitals reviewed. Constitutional: She appears well-developed and well-nourished. No distress.  HENT:  Head: Normocephalic.  Eyes: Pupils are equal, round, and reactive to light.  Neck: Normal range of motion. Neck supple.  Cardiovascular: Normal rate.   Respiratory: Effort normal.  GI: Soft. She exhibits no distension. There is no tenderness. There is no  rebound and no guarding.  Musculoskeletal: Normal range of motion. She exhibits edema and tenderness.  Skin: Skin is warm and dry.  Psychiatric: She has a normal mood and affect.    MAU Course  Procedures Results for orders placed or performed during the hospital encounter of 04/28/16 (from the past 24 hour(s))  Urinalysis, Routine w reflex microscopic (not at [facility])     Status: None   Collection Time: 04/28/16  4:34 PM  Result Value Ref Range   Color, Urine YELLOW YELLOW   APPearance CLEAR CLEAR   Specific Gravity, Urine 1.020 1.005 - 1.030   pH 6.0 5.0 - 8.0   Glucose, UA NEGATIVE NEGATIVE mg/dL   Hgb urine dipstick NEGATIVE NEGATIVE   Bilirubin Urine NEGATIVE NEGATIVE   Ketones, ur NEGATIVE NEGATIVE mg/dL   Protein, ur NEGATIVE NEGATIVE mg/dL   Nitrite NEGATIVE NEGATIVE   Leukocytes, UA NEGATIVE NEGATIVE   Results for orders placed or performed during the hospital encounter of 04/28/16 (from the past 24 hour(s))  Urinalysis, Routine w reflex microscopic (not at [facility])     Status: None   Collection Time: 04/28/16  4:34 PM  Result Value Ref Range   Color, Urine YELLOW YELLOW   APPearance CLEAR CLEAR   Specific Gravity, Urine 1.020 1.005 - 1.030   pH 6.0 5.0 - 8.0   Glucose, UA NEGATIVE NEGATIVE mg/dL   Hgb urine dipstick NEGATIVE NEGATIVE   Bilirubin Urine NEGATIVE NEGATIVE   Ketones, ur NEGATIVE NEGATIVE mg/dL   Protein, ur NEGATIVE NEGATIVE mg/dL   Nitrite NEGATIVE NEGATIVE   Leukocytes, UA NEGATIVE NEGATIVE  Wet prep, genital     Status: Abnormal   Collection Time: 04/28/16  5:52 PM  Result Value Ref Range   Yeast Wet Prep HPF POC NONE SEEN NONE SEEN   Trich, Wet Prep NONE SEEN NONE SEEN   Clue Cells Wet Prep HPF POC NONE SEEN NONE SEEN   WBC, Wet Prep HPF POC MANY (A) NONE SEEN   Sperm NONE SEEN   wet prep done due to normal urine and pt's sx Discussed with Dr. Estanislado Pandyivard- pt to go home and f/u with OB appointment Urine culture pending due to sx NST reactive- baseline  130-140 bpm with 6-25 bpm variability and 15x15 accelerations- no dec; occ mild uc  Assessment and Plan  Bladder pain- normal urine; urine culture pending Heartburn in pregnancy- diet recommendations and Rx discussed Kick counts F/u with scheduled OB appointment Reactive NST Viable IUP [id number]    Keilany Burnette 04/28/2016, 5:36 PM

## 2016-04-28 NOTE — MAU Note (Signed)
C/o pelvic pain today; pain started @ 1200; c/o urinary frequency started today;

## 2016-04-30 LAB — CULTURE, OB URINE: Culture: 6000 — AB

## 2016-05-07 ENCOUNTER — Inpatient Hospital Stay (HOSPITAL_COMMUNITY)
Admission: AD | Admit: 2016-05-07 | Discharge: 2016-05-07 | Disposition: A | Discharge status: Home / Self Care | Payer: Medicaid Other | Source: Ambulatory Visit | Attending: Obstetrics and Gynecology | Admitting: Obstetrics and Gynecology

## 2016-05-07 ENCOUNTER — Encounter (HOSPITAL_COMMUNITY): Payer: Self-pay | Admitting: *Deleted

## 2016-05-07 DIAGNOSIS — Z880 Allergy status to penicillin: Secondary | ICD-10-CM | POA: Insufficient documentation

## 2016-05-07 DIAGNOSIS — O99513 Diseases of the respiratory system complicating pregnancy, third trimester: Secondary | ICD-10-CM | POA: Diagnosis not present

## 2016-05-07 DIAGNOSIS — Z87891 Personal history of nicotine dependence: Secondary | ICD-10-CM | POA: Diagnosis not present

## 2016-05-07 DIAGNOSIS — O9989 Other specified diseases and conditions complicating pregnancy, childbirth and the puerperium: Secondary | ICD-10-CM | POA: Diagnosis not present

## 2016-05-07 DIAGNOSIS — Z3A38 38 weeks gestation of pregnancy: Secondary | ICD-10-CM | POA: Diagnosis not present

## 2016-05-07 DIAGNOSIS — J069 Acute upper respiratory infection, unspecified: Secondary | ICD-10-CM | POA: Insufficient documentation

## 2016-05-07 DIAGNOSIS — O26893 Other specified pregnancy related conditions, third trimester: Secondary | ICD-10-CM | POA: Diagnosis present

## 2016-05-07 LAB — URINALYSIS, ROUTINE W REFLEX MICROSCOPIC
BILIRUBIN URINE: NEGATIVE
Glucose, UA: NEGATIVE mg/dL
Hgb urine dipstick: NEGATIVE
KETONES UR: NEGATIVE mg/dL
NITRITE: NEGATIVE
PROTEIN: NEGATIVE mg/dL
SPECIFIC GRAVITY, URINE: 1.02 (ref 1.005–1.030)
pH: 6 (ref 5.0–8.0)

## 2016-05-07 LAB — RAPID STREP SCREEN (MED CTR MEBANE ONLY): STREPTOCOCCUS, GROUP A SCREEN (DIRECT): NEGATIVE

## 2016-05-07 LAB — INFLUENZA PANEL BY PCR (TYPE A & B)
H1N1FLUPCR: NOT DETECTED
INFLAPCR: NEGATIVE
Influenza B By PCR: NEGATIVE

## 2016-05-07 LAB — URINE MICROSCOPIC-ADD ON: RBC / HPF: NONE SEEN RBC/hpf (ref 0–5)

## 2016-05-07 MED ORDER — ACETAMINOPHEN 325 MG PO TABS
650.0000 mg | ORAL_TABLET | Freq: Once | ORAL | Status: AC
  Administered 2016-05-07: 650 mg via ORAL
  Filled 2016-05-07: qty 2

## 2016-05-07 MED ORDER — GUAIFENESIN ER 600 MG PO TB12: 600.0000 mg | ORAL_TABLET | Freq: Two times a day (BID) | ORAL | 0 refills | Status: DC

## 2016-05-07 NOTE — MAU Note (Signed)
Pt states she is having a headache, runny nose, cough, sneezing, body aches, etc.  Pt states symptoms started yesterday.  Pt states she feels weak.  Pt states she felt one episode of leaking two days ago but nothing since and thinks she peed on herself.  Pt states she is feeling the baby move.  Pt denies any bleeding.

## 2016-05-07 NOTE — MAU Provider Note (Signed)
Chief Complaint:  No chief complaint on file.   First Provider Initiated Contact with Patient 05/07/16 1148     HPI: Maria [name] is a 25 y.o. G1P0000 at 5w2dwho presents to maternity admissions reporting upper respiratory symptoms listed below.  Started yesterday.  States the leaking episode two days ago was creamy white discharge, no watery discharge, did not require a pad or wet clothing.. She reports good fetal movement, denies LOF, vaginal bleeding, vaginal itching/burning, urinary symptoms, h/a, dizziness, n/v, diarrhea, constipation or fever/chills.  She denies headache, visual changes or RUQ abdominal pain.  URI   This is a new problem. The current episode started yesterday. The problem has been unchanged. There has been no fever. Associated symptoms include congestion, headaches, rhinorrhea and a sore throat. Pertinent negatives include no abdominal pain, coughing, diarrhea, dysuria, ear pain, nausea, sinus pain, swollen glands or vomiting. She has tried antihistamine for the symptoms. The treatment provided mild relief.   RN Note: Pt states she is having a headache, runny nose, cough, sneezing, body aches, etc.  Pt states symptoms started yesterday.  Pt states she feels weak.  Pt states she felt one episode of leaking two days ago but nothing since and thinks she peed on herself.  Pt states she is feeling the baby move.  Pt denies any bleeding  Past Medical History: Past Medical History:  Diagnosis Date  . Acute pancreatitis   . Asthma   . GERD (gastroesophageal reflux disease)   . Hemorrhoid   . Ovarian cyst   . Recurrent boils    legs and buttocks.  staff infection, was neg  . UTI (urinary tract infection)     Past obstetric history: OB History  Gravida Para Term Preterm AB Living  1 0 0 0 0 0  SAB TAB Ectopic Multiple Live Births  0 0 0 0      # Outcome Date GA Lbr Len/2nd Weight Sex Delivery Anes PTL Lv  1 Current               Past Surgical History: Past  Surgical History:  Procedure Laterality Date  . TONSILLECTOMY    . WISDOM TOOTH EXTRACTION      Family History: Family History  Problem Relation Age of Onset  . Hypertension Maternal Grandmother   . Heart disease Maternal Grandfather   . Stroke Maternal Grandfather   . Diabetes Neg Hx   . Cancer Neg Hx     Social History: Social History  Substance Use Topics  . Smoking status: Former Smoker    Packs/day: 0.50    Types: Cigarettes    Quit date: 11/07/2013  . Smokeless tobacco: Former Neurosurgeon  . Alcohol use 0.6 oz/week    1 Cans of beer per week     Comment: occasional not while preg    Allergies:  Allergies  Allergen Reactions  . Penicillins Rash and Other (See Comments)    Has patient had a PCN reaction causing immediate rash, facial/tongue/throat swelling, SOB or lightheadedness with hypotension: No Has patient had a PCN reaction causing severe rash involving mucus membranes or skin necrosis: No Has patient had a PCN reaction that required hospitalization No Has patient had a PCN reaction occurring within the last 10 years: No If all of the above answers are "NO", then may proceed with Cephalosporin use.     Meds:  Prescriptions Prior to Admission  Medication Sig Dispense Refill Last Dose  . albuterol (PROVENTIL HFA;VENTOLIN HFA) 108 (90 BASE) MCG/ACT inhaler  Inhale 2 puffs into the lungs every 6 (six) hours as needed for wheezing or shortness of breath.   Past Month at Unknown time  . diphenhydrAMINE (BENADRYL) 25 MG tablet Take 25 mg by mouth every 6 (six) hours as needed.   05/06/2016 at Unknown time  . Doxylamine-Pyridoxine (DICLEGIS) 10-10 MG TBEC Take 2 tablets by mouth at bedtime as needed (for nausea).    Past Week at Unknown time  . Prenatal Vit-Fe Fumarate-FA (PRENATAL MULTIVITAMIN) TABS tablet Take 1 tablet by mouth daily at 12 noon.    05/06/2016 at Unknown time    I have reviewed patient's Past Medical Hx, Surgical Hx, Family Hx, Social Hx, medications and  allergies.   ROS:  Review of Systems  HENT: Positive for congestion, rhinorrhea and sore throat. Negative for ear pain.   Respiratory: Negative for cough.   Gastrointestinal: Negative for abdominal pain, diarrhea, nausea and vomiting.  Genitourinary: Negative for dysuria.  Neurological: Positive for headaches.   Other systems negative  Physical Exam  Patient Vitals for the past 24 hrs:  BP Temp Temp src Pulse Resp  05/07/16 1139 115/71 98 F (36.7 C) Oral 92 18   Vitals:   05/07/16 1139 05/07/16 1330  BP: 115/71 109/62  Pulse: 92 93  Resp: 18   Temp: 98 F (36.7 C)   TempSrc: Oral     Constitutional: Well-developed, well-nourished female in no acute distress.  HEENT:  Throat pink, + nasal congestion Cardiovascular: normal rate and rhythm Respiratory: normal effort, clear to auscultation bilaterally GI: Abd soft, non-tender, gravid appropriate for gestational age.   No rebound or guarding. MS: Extremities nontender, no edema, normal ROM Neurologic: Alert and oriented x 4.  GU: Neg CVAT.  Dilation: 1 Effacement (%): 70 Station: -3 Presentation: Vertex Exam by:: Wynelle Bourgeois, CNM  FHT:  Baseline 140 , moderate variability, accelerations present, no decelerations Contractions:  Irregular    Labs: Results for orders placed or performed during the hospital encounter of 05/07/16 (from the past 72 hour(s))  Urinalysis, Routine w reflex microscopic (not at [facility])     Status: Abnormal   Collection Time: 05/07/16 12:00 PM  Result Value Ref Range   Color, Urine YELLOW YELLOW   APPearance CLEAR CLEAR   Specific Gravity, Urine 1.020 1.005 - 1.030   pH 6.0 5.0 - 8.0   Glucose, UA NEGATIVE NEGATIVE mg/dL   Hgb urine dipstick NEGATIVE NEGATIVE   Bilirubin Urine NEGATIVE NEGATIVE   Ketones, ur NEGATIVE NEGATIVE mg/dL   Protein, ur NEGATIVE NEGATIVE mg/dL   Nitrite NEGATIVE NEGATIVE   Leukocytes, UA SMALL (A) NEGATIVE  Urine microscopic-add on     Status: Abnormal    Collection Time: 05/07/16 12:00 PM  Result Value Ref Range   Squamous Epithelial / LPF 6-30 (A) NONE SEEN   WBC, UA 6-30 0 - 5 WBC/hpf   RBC / HPF NONE SEEN 0 - 5 RBC/hpf   Bacteria, UA MANY (A) NONE SEEN   Urine-Other MUCOUS PRESENT   OB Urine Culture     Status: Abnormal   Collection Time: 05/07/16 12:00 PM  Result Value Ref Range   Specimen Description OB CLEAN CATCH    Special Requests NONE    Culture (A)     MULTIPLE SPECIES PRESENT, SUGGEST RECOLLECTION NO GROUP B STREP (S.AGALACTIAE) ISOLATED Performed at [facility]    Report Status 05/08/2016 FINAL   Influenza panel by PCR (type A & B, H1N1)     Status: None  Collection Time: 05/07/16 12:10 PM  Result Value Ref Range   Influenza A By PCR NEGATIVE NEGATIVE   Influenza B By PCR NEGATIVE NEGATIVE   H1N1 flu by pcr NOT DETECTED NOT DETECTED    Comment:        The Xpert Flu assay (FDA approved for nasal aspirates or washes and nasopharyngeal swab specimens), is intended as an aid in the diagnosis of influenza and should not be used as a sole basis for treatment. Performed at [facility]   Rapid strep screen (not at [facility])     Status: None   Collection Time: 05/07/16 12:10 PM  Result Value Ref Range   Streptococcus, Group A Screen (Direct) NEGATIVE NEGATIVE    Comment: (NOTE) A Rapid Antigen test may result negative if the antigen level in the sample is below the detection level of this test. The FDA has not cleared this test as a stand-alone test therefore the rapid antigen negative result has reflexed to a Group A Strep culture. Performed at [facility]   Culture, group A strep     Status: None (Preliminary result)   Collection Time: 05/07/16 12:10 PM  Result Value Ref Range   Specimen Description THROAT    Special Requests NONE Reflexed from [id number]    Culture      CULTURE REINCUBATED FOR BETTER GROWTH Performed at [facility]    Report Status PENDING     Imaging:  No  results found.  MAU Course/MDM: I have ordered labs and reviewed results. Urine to culture, not treated since no complaints and most indices negative NST reviewed Consult Dr Su Hiltoberts with presentation, exam findings and test results.  Treatments in MAU included Tylenol for mild headache which improved.    Assessment: URI (upper respiratory infection)   Plan: Discharge home Strep pending, flu neg Rx Mucinex for congestion Labor precautions and fetal kick counts Follow up in Office for prenatal visits and recheck of status (appt Tues) Pt stable at time of discharge. Encouraged to return here or to other Urgent Care/ED if she develops worsening of symptoms, increase in pain, fever, or other concerning symptoms.      Wynelle BourgeoisMarie Richie Bonanno CNM, MSN Certified Nurse-Midwife 05/07/2016 12:14 PM

## 2016-05-07 NOTE — Discharge Instructions (Signed)
Upper Respiratory Infection, Adult Most upper respiratory infections (URIs) are a viral infection of the air passages leading to the lungs. A URI affects the nose, throat, and upper air passages. The most common type of URI is nasopharyngitis and is typically referred to as "the common cold." URIs run their course and usually go away on their own. Most of the time, a URI does not require medical attention, but sometimes a bacterial infection in the upper airways can follow a viral infection. This is called a secondary infection. Sinus and middle ear infections are common types of secondary upper respiratory infections. Bacterial pneumonia can also complicate a URI. A URI can worsen asthma and chronic obstructive pulmonary disease (COPD). Sometimes, these complications can require emergency medical care and may be life threatening.  CAUSES Almost all URIs are caused by viruses. A virus is a type of germ and can spread from one person to another.  RISKS FACTORS You may be at risk for a URI if:   You smoke.   You have chronic heart or lung disease.  You have a weakened defense (immune) system.   You are very young or very old.   You have nasal allergies or asthma.  You work in crowded or poorly ventilated areas.  You work in health care facilities or schools. SIGNS AND SYMPTOMS  Symptoms typically develop 2-3 days after you come in contact with a cold virus. Most viral URIs last 7-10 days. However, viral URIs from the influenza virus (flu virus) can last 14-18 days and are typically more severe. Symptoms may include:   Runny or stuffy (congested) nose.   Sneezing.   Cough.   Sore throat.   Headache.   Fatigue.   Fever.   Loss of appetite.   Pain in your forehead, behind your eyes, and over your cheekbones (sinus pain).  Muscle aches.  DIAGNOSIS  Your health care provider may diagnose a URI by:  Physical exam.  Tests to check that your symptoms are not due to  another condition such as:  Strep throat.  Sinusitis.  Pneumonia.  Asthma. TREATMENT  A URI goes away on its own with time. It cannot be cured with medicines, but medicines may be prescribed or recommended to relieve symptoms. Medicines may help:  Reduce your fever.  Reduce your cough.  Relieve nasal congestion. HOME CARE INSTRUCTIONS   Take medicines only as directed by your health care provider.   Gargle warm saltwater or take cough drops to comfort your throat as directed by your health care provider.  Use a warm mist humidifier or inhale steam from a shower to increase air moisture. This may make it easier to breathe.  Drink enough fluid to keep your urine clear or pale yellow.   Eat soups and other clear broths and maintain good nutrition.   Rest as needed.   Return to work when your temperature has returned to normal or as your health care provider advises. You may need to stay home longer to avoid infecting others. You can also use a face mask and careful hand washing to prevent spread of the virus.  Increase the usage of your inhaler if you have asthma.   Do not use any tobacco products, including cigarettes, chewing tobacco, or electronic cigarettes. If you need help quitting, ask your health care provider. PREVENTION  The best way to protect yourself from getting a cold is to practice good hygiene.   Avoid oral or hand contact with people with cold   symptoms.   Wash your hands often if contact occurs.  There is no clear evidence that vitamin C, vitamin E, echinacea, or exercise reduces the chance of developing a cold. However, it is always recommended to get plenty of rest, exercise, and practice good nutrition.  SEEK MEDICAL CARE IF:   You are getting worse rather than better.   Your symptoms are not controlled by medicine.   You have chills.  You have worsening shortness of breath.  You have brown or red mucus.  You have yellow or brown nasal  discharge.  You have pain in your face, especially when you bend forward.  You have a fever.  You have swollen neck glands.  You have pain while swallowing.  You have white areas in the back of your throat. SEEK IMMEDIATE MEDICAL CARE IF:   You have severe or persistent:  Headache.  Ear pain.  Sinus pain.  Chest pain.  You have chronic lung disease and any of the following:  Wheezing.  Prolonged cough.  Coughing up blood.  A change in your usual mucus.  You have a stiff neck.  You have changes in your:  Vision.  Hearing.  Thinking.  Mood. MAKE SURE YOU:   Understand these instructions.  Will watch your condition.  Will get help right away if you are not doing well or get worse.   This information is not intended to replace advice given to you by your health care provider. Make sure you discuss any questions you have with your health care provider.   Document Released: 01/31/2001 Document Revised: 12/22/2014 Document Reviewed: 11/12/2013 Elsevier Interactive Patient Education 2016 Elsevier Inc[employer]  

## 2016-05-08 LAB — CULTURE, OB URINE

## 2016-05-08 LAB — OB RESULTS CONSOLE GBS: GBS: POSITIVE

## 2016-05-10 LAB — CULTURE, GROUP A STREP (THRC)

## 2016-05-15 ENCOUNTER — Inpatient Hospital Stay (HOSPITAL_COMMUNITY)
Admission: AD | Admit: 2016-05-15 | Discharge: 2016-05-15 | Disposition: A | Discharge status: Home / Self Care | Payer: Medicaid Other | Source: Ambulatory Visit | Attending: Obstetrics & Gynecology | Admitting: Obstetrics & Gynecology

## 2016-05-15 ENCOUNTER — Encounter (HOSPITAL_COMMUNITY): Payer: Self-pay | Admitting: *Deleted

## 2016-05-15 DIAGNOSIS — O479 False labor, unspecified: Secondary | ICD-10-CM | POA: Insufficient documentation

## 2016-05-15 DIAGNOSIS — Z3A00 Weeks of gestation of pregnancy not specified: Secondary | ICD-10-CM | POA: Diagnosis not present

## 2016-05-15 NOTE — MAU Note (Signed)
Pt reports contractions and a lot of pain in her lower abd for 2 days. Denies bleeding or ROM.

## 2016-05-15 NOTE — Discharge Instructions (Signed)
Fetal Movement Counts  Patient Name: __________________________________________________ Patient Due Date: ____________________  Performing a fetal movement count is highly recommended in high-risk pregnancies, but it is good for every pregnant woman to do. Your health care provider may ask you to start counting fetal movements at 28 weeks of the pregnancy. Fetal movements often increase:  · After eating a full meal.  · After physical activity.  · After eating or drinking something sweet or cold.  · At rest.  Pay attention to when you feel the baby is most active. This will help you notice a pattern of your baby's sleep and wake cycles and what factors contribute to an increase in fetal movement. It is important to perform a fetal movement count at the same time each day when your baby is normally most active.   HOW TO COUNT FETAL MOVEMENTS  1. Find a quiet and comfortable area to sit or lie down on your left side. Lying on your left side provides the best blood and oxygen circulation to your baby.  2. Write down the day and time on a sheet of paper or in a journal.  3. Start counting kicks, flutters, swishes, rolls, or jabs in a 2-hour period. You should feel at least 10 movements within 2 hours.  4. If you do not feel 10 movements in 2 hours, wait 2-3 hours and count again. Look for a change in the pattern or not enough counts in 2 hours.  SEEK MEDICAL CARE IF:  · You feel less than 10 counts in 2 hours, tried twice.  · There is no movement in over an hour.  · The pattern is changing or taking longer each day to reach 10 counts in 2 hours.  · You feel the baby is not moving as he or she usually does.  Date: ____________ Movements: ____________ Start time: ____________ Finish time: ____________   Date: ____________ Movements: ____________ Start time: ____________ Finish time: ____________  Date: ____________ Movements: ____________ Start time: ____________ Finish time: ____________  Date: ____________ Movements:  ____________ Start time: ____________ Finish time: ____________  Date: ____________ Movements: ____________ Start time: ____________ Finish time: ____________  Date: ____________ Movements: ____________ Start time: ____________ Finish time: ____________  Date: ____________ Movements: ____________ Start time: ____________ Finish time: ____________  Date: ____________ Movements: ____________ Start time: ____________ Finish time: ____________   Date: ____________ Movements: ____________ Start time: ____________ Finish time: ____________  Date: ____________ Movements: ____________ Start time: ____________ Finish time: ____________  Date: ____________ Movements: ____________ Start time: ____________ Finish time: ____________  Date: ____________ Movements: ____________ Start time: ____________ Finish time: ____________  Date: ____________ Movements: ____________ Start time: ____________ Finish time: ____________  Date: ____________ Movements: ____________ Start time: ____________ Finish time: ____________  Date: ____________ Movements: ____________ Start time: ____________ Finish time: ____________   Date: ____________ Movements: ____________ Start time: ____________ Finish time: ____________  Date: ____________ Movements: ____________ Start time: ____________ Finish time: ____________  Date: ____________ Movements: ____________ Start time: ____________ Finish time: ____________  Date: ____________ Movements: ____________ Start time: ____________ Finish time: ____________  Date: ____________ Movements: ____________ Start time: ____________ Finish time: ____________  Date: ____________ Movements: ____________ Start time: ____________ Finish time: ____________  Date: ____________ Movements: ____________ Start time: ____________ Finish time: ____________   Date: ____________ Movements: ____________ Start time: ____________ Finish time: ____________  Date: ____________ Movements: ____________ Start time: ____________ Finish  time: ____________  Date: ____________ Movements: ____________ Start time: ____________ Finish time: ____________  Date: ____________ Movements: ____________ Start time:   ____________ Finish time: ____________  Date: ____________ Movements: ____________ Start time: ____________ Finish time: ____________  Date: ____________ Movements: ____________ Start time: ____________ Finish time: ____________  Date: ____________ Movements: ____________ Start time: ____________ Finish time: ____________   Date: ____________ Movements: ____________ Start time: ____________ Finish time: ____________  Date: ____________ Movements: ____________ Start time: ____________ Finish time: ____________  Date: ____________ Movements: ____________ Start time: ____________ Finish time: ____________  Date: ____________ Movements: ____________ Start time: ____________ Finish time: ____________  Date: ____________ Movements: ____________ Start time: ____________ Finish time: ____________  Date: ____________ Movements: ____________ Start time: ____________ Finish time: ____________  Date: ____________ Movements: ____________ Start time: ____________ Finish time: ____________   Date: ____________ Movements: ____________ Start time: ____________ Finish time: ____________  Date: ____________ Movements: ____________ Start time: ____________ Finish time: ____________  Date: ____________ Movements: ____________ Start time: ____________ Finish time: ____________  Date: ____________ Movements: ____________ Start time: ____________ Finish time: ____________  Date: ____________ Movements: ____________ Start time: ____________ Finish time: ____________  Date: ____________ Movements: ____________ Start time: ____________ Finish time: ____________  Date: ____________ Movements: ____________ Start time: ____________ Finish time: ____________   Date: ____________ Movements: ____________ Start time: ____________ Finish time: ____________  Date: ____________  Movements: ____________ Start time: ____________ Finish time: ____________  Date: ____________ Movements: ____________ Start time: ____________ Finish time: ____________  Date: ____________ Movements: ____________ Start time: ____________ Finish time: ____________  Date: ____________ Movements: ____________ Start time: ____________ Finish time: ____________  Date: ____________ Movements: ____________ Start time: ____________ Finish time: ____________  Date: ____________ Movements: ____________ Start time: ____________ Finish time: ____________   Date: ____________ Movements: ____________ Start time: ____________ Finish time: ____________  Date: ____________ Movements: ____________ Start time: ____________ Finish time: ____________  Date: ____________ Movements: ____________ Start time: ____________ Finish time: ____________  Date: ____________ Movements: ____________ Start time: ____________ Finish time: ____________  Date: ____________ Movements: ____________ Start time: ____________ Finish time: ____________  Date: ____________ Movements: ____________ Start time: ____________ Finish time: ____________     This information is not intended to replace advice given to you by your health care provider. Make sure you discuss any questions you have with your health care provider.     Document Released: 09/06/2006 Document Revised: 08/28/2014 Document Reviewed: 06/03/2012  [employer]vier Interactive Patient Education ©2016 [employer]

## 2016-05-18 ENCOUNTER — Encounter (HOSPITAL_COMMUNITY): Payer: Self-pay

## 2016-05-18 ENCOUNTER — Inpatient Hospital Stay (HOSPITAL_COMMUNITY)
Admission: AD | Admit: 2016-05-18 | Discharge: 2016-05-19 | Disposition: A | Discharge status: Home / Self Care | Payer: Medicaid Other | Source: Ambulatory Visit | Attending: Obstetrics and Gynecology | Admitting: Obstetrics and Gynecology

## 2016-05-18 DIAGNOSIS — Z3493 Encounter for supervision of normal pregnancy, unspecified, third trimester: Secondary | ICD-10-CM | POA: Insufficient documentation

## 2016-05-18 DIAGNOSIS — Z3A39 39 weeks gestation of pregnancy: Secondary | ICD-10-CM | POA: Insufficient documentation

## 2016-05-18 NOTE — MAU Note (Signed)
Patient presents with ctx 5 mins apart. Patent denies any Lof but some spotting when she wipes. Fetus active.

## 2016-05-19 ENCOUNTER — Telehealth (HOSPITAL_COMMUNITY): Payer: Self-pay | Admitting: *Deleted

## 2016-05-19 ENCOUNTER — Encounter (HOSPITAL_COMMUNITY): Payer: Self-pay | Admitting: *Deleted

## 2016-05-19 DIAGNOSIS — Z3A39 39 weeks gestation of pregnancy: Secondary | ICD-10-CM | POA: Diagnosis not present

## 2016-05-19 DIAGNOSIS — Z3493 Encounter for supervision of normal pregnancy, unspecified, third trimester: Secondary | ICD-10-CM | POA: Diagnosis present

## 2016-05-19 NOTE — Telephone Encounter (Signed)
Preadmission screen  

## 2016-05-19 NOTE — Discharge Instructions (Signed)
Fetal Movement Counts °Patient Name: __________________________________________________ Patient Due Date: ____________________ °Performing a fetal movement count is highly recommended in high-risk pregnancies, but it is good for every pregnant woman to do. Your health care provider may ask you to start counting fetal movements at 28 weeks of the pregnancy. Fetal movements often increase: °· After eating a full meal. °· After physical activity. °· After eating or drinking something sweet or cold. °· At rest. °Pay attention to when you feel the baby is most active. This will help you notice a pattern of your baby's sleep and wake cycles and what factors contribute to an increase in fetal movement. It is important to perform a fetal movement count at the same time each day when your baby is normally most active.  °HOW TO COUNT FETAL MOVEMENTS °1. Find a quiet and comfortable area to sit or lie down on your left side. Lying on your left side provides the best blood and oxygen circulation to your baby. °2. Write down the day and time on a sheet of paper or in a journal. °3. Start counting kicks, flutters, swishes, rolls, or jabs in a 2-hour period. You should feel at least 10 movements within 2 hours. °4. If you do not feel 10 movements in 2 hours, wait 2-3 hours and count again. Look for a change in the pattern or not enough counts in 2 hours. °SEEK MEDICAL CARE IF: °· You feel less than 10 counts in 2 hours, tried twice. °· There is no movement in over an hour. °· The pattern is changing or taking longer each day to reach 10 counts in 2 hours. °· You feel the baby is not moving as he or she usually does. °Date: ____________ Movements: ____________ Start time: ____________ Finish time: ____________  °Date: ____________ Movements: ____________ Start time: ____________ Finish time: ____________ °Date: ____________ Movements: ____________ Start time: ____________ Finish time: ____________ °Date: ____________ Movements:  ____________ Start time: ____________ Finish time: ____________ °Date: ____________ Movements: ____________ Start time: ____________ Finish time: ____________ °Date: ____________ Movements: ____________ Start time: ____________ Finish time: ____________ °Date: ____________ Movements: ____________ Start time: ____________ Finish time: ____________ °Date: ____________ Movements: ____________ Start time: ____________ Finish time: ____________  °Date: ____________ Movements: ____________ Start time: ____________ Finish time: ____________ °Date: ____________ Movements: ____________ Start time: ____________ Finish time: ____________ °Date: ____________ Movements: ____________ Start time: ____________ Finish time: ____________ °Date: ____________ Movements: ____________ Start time: ____________ Finish time: ____________ °Date: ____________ Movements: ____________ Start time: ____________ Finish time: ____________ °Date: ____________ Movements: ____________ Start time: ____________ Finish time: ____________ °Date: ____________ Movements: ____________ Start time: ____________ Finish time: ____________  °Date: ____________ Movements: ____________ Start time: ____________ Finish time: ____________ °Date: ____________ Movements: ____________ Start time: ____________ Finish time: ____________ °Date: ____________ Movements: ____________ Start time: ____________ Finish time: ____________ °Date: ____________ Movements: ____________ Start time: ____________ Finish time: ____________ °Date: ____________ Movements: ____________ Start time: ____________ Finish time: ____________ °Date: ____________ Movements: ____________ Start time: ____________ Finish time: ____________ °Date: ____________ Movements: ____________ Start time: ____________ Finish time: ____________  °Date: ____________ Movements: ____________ Start time: ____________ Finish time: ____________ °Date: ____________ Movements: ____________ Start time: ____________ Finish  time: ____________ °Date: ____________ Movements: ____________ Start time: ____________ Finish time: ____________ °Date: ____________ Movements: ____________ Start time: ____________ Finish time: ____________ °Date: ____________ Movements: ____________ Start time: ____________ Finish time: ____________ °Date: ____________ Movements: ____________ Start time: ____________ Finish time: ____________ °Date: ____________ Movements: ____________ Start time: ____________ Finish time: ____________  °Date: ____________ Movements: ____________ Start time: ____________ Finish   time: ____________ °Date: ____________ Movements: ____________ Start time: ____________ Finish time: ____________ °Date: ____________ Movements: ____________ Start time: ____________ Finish time: ____________ °Date: ____________ Movements: ____________ Start time: ____________ Finish time: ____________ °Date: ____________ Movements: ____________ Start time: ____________ Finish time: ____________ °Date: ____________ Movements: ____________ Start time: ____________ Finish time: ____________ °Date: ____________ Movements: ____________ Start time: ____________ Finish time: ____________  °Date: ____________ Movements: ____________ Start time: ____________ Finish time: ____________ °Date: ____________ Movements: ____________ Start time: ____________ Finish time: ____________ °Date: ____________ Movements: ____________ Start time: ____________ Finish time: ____________ °Date: ____________ Movements: ____________ Start time: ____________ Finish time: ____________ °Date: ____________ Movements: ____________ Start time: ____________ Finish time: ____________ °Date: ____________ Movements: ____________ Start time: ____________ Finish time: ____________ °Date: ____________ Movements: ____________ Start time: ____________ Finish time: ____________  °Date: ____________ Movements: ____________ Start time: ____________ Finish time: ____________ °Date: ____________  Movements: ____________ Start time: ____________ Finish time: ____________ °Date: ____________ Movements: ____________ Start time: ____________ Finish time: ____________ °Date: ____________ Movements: ____________ Start time: ____________ Finish time: ____________ °Date: ____________ Movements: ____________ Start time: ____________ Finish time: ____________ °Date: ____________ Movements: ____________ Start time: ____________ Finish time: ____________ °Date: ____________ Movements: ____________ Start time: ____________ Finish time: ____________  °Date: ____________ Movements: ____________ Start time: ____________ Finish time: ____________ °Date: ____________ Movements: ____________ Start time: ____________ Finish time: ____________ °Date: ____________ Movements: ____________ Start time: ____________ Finish time: ____________ °Date: ____________ Movements: ____________ Start time: ____________ Finish time: ____________ °Date: ____________ Movements: ____________ Start time: ____________ Finish time: ____________ °Date: ____________ Movements: ____________ Start time: ____________ Finish time: ____________ °  °This information is not intended to replace advice given to you by your health care provider. Make sure you discuss any questions you have with your health care provider. °  °Document Released: 09/06/2006 Document Revised: 08/28/2014 Document Reviewed: 06/03/2012 °[employer] Interactive Patient Education ©2016 [employer] °Braxton Hicks Contractions °Contractions of the uterus can occur throughout pregnancy. Contractions are not always a sign that you are in labor.  °WHAT ARE BRAXTON HICKS CONTRACTIONS?  °Contractions that occur before labor are called Braxton Hicks contractions, or false labor. Toward the end of pregnancy (32-34 weeks), these contractions can develop more often and may become more forceful. This is not true labor because these contractions do not result in opening (dilatation) and thinning of  the cervix. They are sometimes difficult to tell apart from true labor because these contractions can be forceful and people have different pain tolerances. You should not feel embarrassed if you go to the hospital with false labor. Sometimes, the only way to tell if you are in true labor is for your health care provider to look for changes in the cervix. °If there are no prenatal problems or other health problems associated with the pregnancy, it is completely safe to be sent home with false labor and await the onset of true labor. °HOW CAN YOU TELL THE DIFFERENCE BETWEEN TRUE AND FALSE LABOR? °False Labor °· The contractions of false labor are usually shorter and not as hard as those of true labor.   °· The contractions are usually irregular.   °· The contractions are often felt in the front of the lower abdomen and in the groin.   °· The contractions may go away when you walk around or change positions while lying down.   °· The contractions get weaker and are shorter lasting as time goes on.   °· The contractions do not usually become progressively stronger, regular, and closer together as with true labor.   °True Labor °· Contractions in true   labor last 30-70 seconds, become very regular, usually become more intense, and increase in frequency.   °· The contractions do not go away with walking.   °· The discomfort is usually felt in the top of the uterus and spreads to the lower abdomen and low back.   °· True labor can be determined by your health care provider with an exam. This will show that the cervix is dilating and getting thinner.   °WHAT TO REMEMBER °· Keep up with your usual exercises and follow other instructions given by your health care provider.   °· Take medicines as directed by your health care provider.   °· Keep your regular prenatal appointments.   °· Eat and drink lightly if you think you are going into labor.   °· If Braxton Hicks contractions are making you uncomfortable:   °¨ Change your  position from lying down or resting to walking, or from walking to resting.   °¨ Sit and rest in a tub of warm water.   °¨ Drink 2-3 glasses of water. Dehydration may cause these contractions.   °¨ Do slow and deep breathing several times an hour.   °WHEN SHOULD I SEEK IMMEDIATE MEDICAL CARE? °Seek immediate medical care if: °· Your contractions become stronger, more regular, and closer together.   °· You have fluid leaking or gushing from your vagina.   °· You have a fever.   °· You pass blood-tinged mucus.   °· You have vaginal bleeding.   °· You have continuous abdominal pain.   °· You have low back pain that you never had before.   °· You feel your baby's head pushing down and causing pelvic pressure.   °· Your baby is not moving as much as it used to.   °  °This information is not intended to replace advice given to you by your health care provider. Make sure you discuss any questions you have with your health care provider. °  °Document Released: 08/07/2005 Document Revised: 08/12/2013 Document Reviewed: 05/19/2013 °[employer] Interactive Patient Education ©2016 [employer] ° °

## 2016-05-19 NOTE — MAU Note (Signed)
Notified provider that patient is here for a labor eval. Patient was 1.5/80/-3. Patient walked for an hour and is unchanged. Provider said to discharge patient with labor precautions.

## 2016-05-20 ENCOUNTER — Encounter (HOSPITAL_COMMUNITY): Payer: Self-pay

## 2016-05-20 ENCOUNTER — Inpatient Hospital Stay (HOSPITAL_COMMUNITY)
Admission: AD | Admit: 2016-05-20 | Discharge: 2016-05-20 | Disposition: A | Discharge status: Home / Self Care | Payer: Medicaid Other | Source: Ambulatory Visit | Attending: Obstetrics & Gynecology | Admitting: Obstetrics & Gynecology

## 2016-05-20 DIAGNOSIS — Z3493 Encounter for supervision of normal pregnancy, unspecified, third trimester: Secondary | ICD-10-CM | POA: Diagnosis not present

## 2016-05-20 DIAGNOSIS — Z3A40 40 weeks gestation of pregnancy: Secondary | ICD-10-CM | POA: Diagnosis not present

## 2016-05-20 LAB — AMNISURE RUPTURE OF MEMBRANE (ROM) NOT AT ARMC: AMNISURE: NEGATIVE

## 2016-05-20 LAB — POCT FERN TEST: POCT Fern Test: NEGATIVE

## 2016-05-20 NOTE — Discharge Instructions (Signed)
Braxton Hicks Contractions °Contractions of the uterus can occur throughout pregnancy. Contractions are not always a sign that you are in labor.  °WHAT ARE BRAXTON HICKS CONTRACTIONS?  °Contractions that occur before labor are called Braxton Hicks contractions, or false labor. Toward the end of pregnancy (32-34 weeks), these contractions can develop more often and may become more forceful. This is not true labor because these contractions do not result in opening (dilatation) and thinning of the cervix. They are sometimes difficult to tell apart from true labor because these contractions can be forceful and people have different pain tolerances. You should not feel embarrassed if you go to the hospital with false labor. Sometimes, the only way to tell if you are in true labor is for your health care provider to look for changes in the cervix. °If there are no prenatal problems or other health problems associated with the pregnancy, it is completely safe to be sent home with false labor and await the onset of true labor. °HOW CAN YOU TELL THE DIFFERENCE BETWEEN TRUE AND FALSE LABOR? °False Labor °· The contractions of false labor are usually shorter and not as hard as those of true labor.   °· The contractions are usually irregular.   °· The contractions are often felt in the front of the lower abdomen and in the groin.   °· The contractions may go away when you walk around or change positions while lying down.   °· The contractions get weaker and are shorter lasting as time goes on.   °· The contractions do not usually become progressively stronger, regular, and closer together as with true labor.   °True Labor °· Contractions in true labor last 30-70 seconds, become very regular, usually become more intense, and increase in frequency.   °· The contractions do not go away with walking.   °· The discomfort is usually felt in the top of the uterus and spreads to the lower abdomen and low back.   °· True labor can be  determined by your health care provider with an exam. This will show that the cervix is dilating and getting thinner.   °WHAT TO REMEMBER °· Keep up with your usual exercises and follow other instructions given by your health care provider.   °· Take medicines as directed by your health care provider.   °· Keep your regular prenatal appointments.   °· Eat and drink lightly if you think you are going into labor.   °· If Braxton Hicks contractions are making you uncomfortable:   °¨ Change your position from lying down or resting to walking, or from walking to resting.   °¨ Sit and rest in a tub of warm water.   °¨ Drink 2-3 glasses of water. Dehydration may cause these contractions.   °¨ Do slow and deep breathing several times an hour.   °WHEN SHOULD I SEEK IMMEDIATE MEDICAL CARE? °Seek immediate medical care if: °· Your contractions become stronger, more regular, and closer together.   °· You have fluid leaking or gushing from your vagina.   °· You have a fever.   °· You pass blood-tinged mucus.   °· You have vaginal bleeding.   °· You have continuous abdominal pain.   °· You have low back pain that you never had before.   °· You feel your baby's head pushing down and causing pelvic pressure.   °· Your baby is not moving as much as it used to.   °  °This information is not intended to replace advice given to you by your health care provider. Make sure you discuss any questions you have with your health care   provider. °  °Document Released: 08/07/2005 Document Revised: 08/12/2013 Document Reviewed: 05/19/2013 °[employer] Interactive Patient Education ©2016 [employer] °Fetal Movement Counts °Patient Name: __________________________________________________ Patient Due Date: ____________________ °Performing a fetal movement count is highly recommended in high-risk pregnancies, but it is good for every pregnant woman to do. Your health care provider may ask you to start counting fetal movements at 28 weeks of the  pregnancy. Fetal movements often increase: °· After eating a full meal. °· After physical activity. °· After eating or drinking something sweet or cold. °· At rest. °Pay attention to when you feel the baby is most active. This will help you notice a pattern of your baby's sleep and wake cycles and what factors contribute to an increase in fetal movement. It is important to perform a fetal movement count at the same time each day when your baby is normally most active.  °HOW TO COUNT FETAL MOVEMENTS °1. Find a quiet and comfortable area to sit or lie down on your left side. Lying on your left side provides the best blood and oxygen circulation to your baby. °2. Write down the day and time on a sheet of paper or in a journal. °3. Start counting kicks, flutters, swishes, rolls, or jabs in a 2-hour period. You should feel at least 10 movements within 2 hours. °4. If you do not feel 10 movements in 2 hours, wait 2-3 hours and count again. Look for a change in the pattern or not enough counts in 2 hours. °SEEK MEDICAL CARE IF: °· You feel less than 10 counts in 2 hours, tried twice. °· There is no movement in over an hour. °· The pattern is changing or taking longer each day to reach 10 counts in 2 hours. °· You feel the baby is not moving as he or she usually does. °Date: ____________ Movements: ____________ Start time: ____________ Finish time: ____________  °Date: ____________ Movements: ____________ Start time: ____________ Finish time: ____________ °Date: ____________ Movements: ____________ Start time: ____________ Finish time: ____________ °Date: ____________ Movements: ____________ Start time: ____________ Finish time: ____________ °Date: ____________ Movements: ____________ Start time: ____________ Finish time: ____________ °Date: ____________ Movements: ____________ Start time: ____________ Finish time: ____________ °Date: ____________ Movements: ____________ Start time: ____________ Finish time:  ____________ °Date: ____________ Movements: ____________ Start time: ____________ Finish time: ____________  °Date: ____________ Movements: ____________ Start time: ____________ Finish time: ____________ °Date: ____________ Movements: ____________ Start time: ____________ Finish time: ____________ °Date: ____________ Movements: ____________ Start time: ____________ Finish time: ____________ °Date: ____________ Movements: ____________ Start time: ____________ Finish time: ____________ °Date: ____________ Movements: ____________ Start time: ____________ Finish time: ____________ °Date: ____________ Movements: ____________ Start time: ____________ Finish time: ____________ °Date: ____________ Movements: ____________ Start time: ____________ Finish time: ____________  °Date: ____________ Movements: ____________ Start time: ____________ Finish time: ____________ °Date: ____________ Movements: ____________ Start time: ____________ Finish time: ____________ °Date: ____________ Movements: ____________ Start time: ____________ Finish time: ____________ °Date: ____________ Movements: ____________ Start time: ____________ Finish time: ____________ °Date: ____________ Movements: ____________ Start time: ____________ Finish time: ____________ °Date: ____________ Movements: ____________ Start time: ____________ Finish time: ____________ °Date: ____________ Movements: ____________ Start time: ____________ Finish time: ____________  °Date: ____________ Movements: ____________ Start time: ____________ Finish time: ____________ °Date: ____________ Movements: ____________ Start time: ____________ Finish time: ____________ °Date: ____________ Movements: ____________ Start time: ____________ Finish time: ____________ °Date: ____________ Movements: ____________ Start time: ____________ Finish time: ____________ °Date: ____________ Movements: ____________ Start time: ____________ Finish time: ____________ °Date: ____________ Movements:  ____________ Start time: ____________ Finish time:   ____________ °Date: ____________ Movements: ____________ Start time: ____________ Finish time: ____________  °Date: ____________ Movements: ____________ Start time: ____________ Finish time: ____________ °Date: ____________ Movements: ____________ Start time: ____________ Finish time: ____________ °Date: ____________ Movements: ____________ Start time: ____________ Finish time: ____________ °Date: ____________ Movements: ____________ Start time: ____________ Finish time: ____________ °Date: ____________ Movements: ____________ Start time: ____________ Finish time: ____________ °Date: ____________ Movements: ____________ Start time: ____________ Finish time: ____________ °Date: ____________ Movements: ____________ Start time: ____________ Finish time: ____________  °Date: ____________ Movements: ____________ Start time: ____________ Finish time: ____________ °Date: ____________ Movements: ____________ Start time: ____________ Finish time: ____________ °Date: ____________ Movements: ____________ Start time: ____________ Finish time: ____________ °Date: ____________ Movements: ____________ Start time: ____________ Finish time: ____________ °Date: ____________ Movements: ____________ Start time: ____________ Finish time: ____________ °Date: ____________ Movements: ____________ Start time: ____________ Finish time: ____________ °Date: ____________ Movements: ____________ Start time: ____________ Finish time: ____________  °Date: ____________ Movements: ____________ Start time: ____________ Finish time: ____________ °Date: ____________ Movements: ____________ Start time: ____________ Finish time: ____________ °Date: ____________ Movements: ____________ Start time: ____________ Finish time: ____________ °Date: ____________ Movements: ____________ Start time: ____________ Finish time: ____________ °Date: ____________ Movements: ____________ Start time: ____________ Finish  time: ____________ °Date: ____________ Movements: ____________ Start time: ____________ Finish time: ____________ °Date: ____________ Movements: ____________ Start time: ____________ Finish time: ____________  °Date: ____________ Movements: ____________ Start time: ____________ Finish time: ____________ °Date: ____________ Movements: ____________ Start time: ____________ Finish time: ____________ °Date: ____________ Movements: ____________ Start time: ____________ Finish time: ____________ °Date: ____________ Movements: ____________ Start time: ____________ Finish time: ____________ °Date: ____________ Movements: ____________ Start time: ____________ Finish time: ____________ °Date: ____________ Movements: ____________ Start time: ____________ Finish time: ____________ °  °This information is not intended to replace advice given to you by your health care provider. Make sure you discuss any questions you have with your health care provider. °  °Document Released: 09/06/2006 Document Revised: 08/28/2014 Document Reviewed: 06/03/2012 °[employer] Interactive Patient Education ©2016 [employer] ° °

## 2016-05-20 NOTE — MAU Note (Signed)
Pt reports leaking small amounts of clear fluid since noon. Having contractions every 2-3 mins. Denies vag bleeding. +FM. +GBS. Was 1.5cm on last SVE.

## 2016-05-22 ENCOUNTER — Encounter (HOSPITAL_COMMUNITY): Payer: Self-pay | Admitting: *Deleted

## 2016-05-22 ENCOUNTER — Inpatient Hospital Stay (HOSPITAL_COMMUNITY): Payer: Medicaid Other

## 2016-05-22 ENCOUNTER — Inpatient Hospital Stay (HOSPITAL_COMMUNITY)
Admission: AD | Admit: 2016-05-22 | Discharge: 2016-05-22 | Disposition: A | Discharge status: Home / Self Care | Payer: Medicaid Other | Source: Ambulatory Visit | Attending: Obstetrics and Gynecology | Admitting: Obstetrics and Gynecology

## 2016-05-22 DIAGNOSIS — Z3A40 40 weeks gestation of pregnancy: Secondary | ICD-10-CM | POA: Insufficient documentation

## 2016-05-22 DIAGNOSIS — O368130 Decreased fetal movements, third trimester, not applicable or unspecified: Secondary | ICD-10-CM | POA: Insufficient documentation

## 2016-05-22 NOTE — MAU Note (Signed)
Urine in lab 

## 2016-05-22 NOTE — Discharge Instructions (Signed)
Fetal Movement Counts °Patient Name: __________________________________________________ Patient Due Date: ____________________ °Performing a fetal movement count is highly recommended in high-risk pregnancies, but it is good for every pregnant woman to do. Your health care provider may ask you to start counting fetal movements at 28 weeks of the pregnancy. Fetal movements often increase: °· After eating a full meal. °· After physical activity. °· After eating or drinking something sweet or cold. °· At rest. °Pay attention to when you feel the baby is most active. This will help you notice a pattern of your baby's sleep and wake cycles and what factors contribute to an increase in fetal movement. It is important to perform a fetal movement count at the same time each day when your baby is normally most active.  °HOW TO COUNT FETAL MOVEMENTS °1. Find a quiet and comfortable area to sit or lie down on your left side. Lying on your left side provides the best blood and oxygen circulation to your baby. °2. Write down the day and time on a sheet of paper or in a journal. °3. Start counting kicks, flutters, swishes, rolls, or jabs in a 2-hour period. You should feel at least 10 movements within 2 hours. °4. If you do not feel 10 movements in 2 hours, wait 2-3 hours and count again. Look for a change in the pattern or not enough counts in 2 hours. °SEEK MEDICAL CARE IF: °· You feel less than 10 counts in 2 hours, tried twice. °· There is no movement in over an hour. °· The pattern is changing or taking longer each day to reach 10 counts in 2 hours. °· You feel the baby is not moving as he or she usually does. °Date: ____________ Movements: ____________ Start time: ____________ Finish time: ____________  °Date: ____________ Movements: ____________ Start time: ____________ Finish time: ____________ °Date: ____________ Movements: ____________ Start time: ____________ Finish time: ____________ °Date: ____________ Movements:  ____________ Start time: ____________ Finish time: ____________ °Date: ____________ Movements: ____________ Start time: ____________ Finish time: ____________ °Date: ____________ Movements: ____________ Start time: ____________ Finish time: ____________ °Date: ____________ Movements: ____________ Start time: ____________ Finish time: ____________ °Date: ____________ Movements: ____________ Start time: ____________ Finish time: ____________  °Date: ____________ Movements: ____________ Start time: ____________ Finish time: ____________ °Date: ____________ Movements: ____________ Start time: ____________ Finish time: ____________ °Date: ____________ Movements: ____________ Start time: ____________ Finish time: ____________ °Date: ____________ Movements: ____________ Start time: ____________ Finish time: ____________ °Date: ____________ Movements: ____________ Start time: ____________ Finish time: ____________ °Date: ____________ Movements: ____________ Start time: ____________ Finish time: ____________ °Date: ____________ Movements: ____________ Start time: ____________ Finish time: ____________  °Date: ____________ Movements: ____________ Start time: ____________ Finish time: ____________ °Date: ____________ Movements: ____________ Start time: ____________ Finish time: ____________ °Date: ____________ Movements: ____________ Start time: ____________ Finish time: ____________ °Date: ____________ Movements: ____________ Start time: ____________ Finish time: ____________ °Date: ____________ Movements: ____________ Start time: ____________ Finish time: ____________ °Date: ____________ Movements: ____________ Start time: ____________ Finish time: ____________ °Date: ____________ Movements: ____________ Start time: ____________ Finish time: ____________  °Date: ____________ Movements: ____________ Start time: ____________ Finish time: ____________ °Date: ____________ Movements: ____________ Start time: ____________ Finish  time: ____________ °Date: ____________ Movements: ____________ Start time: ____________ Finish time: ____________ °Date: ____________ Movements: ____________ Start time: ____________ Finish time: ____________ °Date: ____________ Movements: ____________ Start time: ____________ Finish time: ____________ °Date: ____________ Movements: ____________ Start time: ____________ Finish time: ____________ °Date: ____________ Movements: ____________ Start time: ____________ Finish time: ____________  °Date: ____________ Movements: ____________ Start time: ____________ Finish   time: ____________ Date: ____________ Movements: ____________ Start time: ____________ Doreatha MartinFinish time: ____________ Date: ____________ Movements: ____________ Start time: ____________ Doreatha MartinFinish time: ____________ Date: ____________ Movements: ____________ Start time: ____________ Doreatha MartinFinish time: ____________ Date: ____________ Movements: ____________ Start time: ____________ Doreatha MartinFinish time: ____________ Date: ____________ Movements: ____________ Start time: ____________ Doreatha MartinFinish time: ____________ Date: ____________ Movements: ____________ Start time: ____________ Doreatha MartinFinish time: ____________  Date: ____________ Movements: ____________ Start time: ____________ Doreatha MartinFinish time: ____________ Date: ____________ Movements: ____________ Start time: ____________ Doreatha MartinFinish time: ____________ Date: ____________ Movements: ____________ Start time: ____________ Doreatha MartinFinish time: ____________ Date: ____________ Movements: ____________ Start time: ____________ Doreatha MartinFinish time: ____________ Date: ____________ Movements: ____________ Start time: ____________ Doreatha MartinFinish time: ____________ Date: ____________ Movements: ____________ Start time: ____________ Doreatha MartinFinish time: ____________ Date: ____________ Movements: ____________ Start time: ____________ Doreatha MartinFinish time: ____________  Date: ____________ Movements: ____________ Start time: ____________ Doreatha MartinFinish time: ____________ Date: ____________  Movements: ____________ Start time: ____________ Doreatha MartinFinish time: ____________ Date: ____________ Movements: ____________ Start time: ____________ Doreatha MartinFinish time: ____________ Date: ____________ Movements: ____________ Start time: ____________ Doreatha MartinFinish time: ____________ Date: ____________ Movements: ____________ Start time: ____________ Doreatha MartinFinish time: ____________ Date: ____________ Movements: ____________ Start time: ____________ Doreatha MartinFinish time: ____________ Date: ____________ Movements: ____________ Start time: ____________ Doreatha MartinFinish time: ____________  Date: ____________ Movements: ____________ Start time: ____________ Doreatha MartinFinish time: ____________ Date: ____________ Movements: ____________ Start time: ____________ Doreatha MartinFinish time: ____________ Date: ____________ Movements: ____________ Start time: ____________ Doreatha MartinFinish time: ____________ Date: ____________ Movements: ____________ Start time: ____________ Doreatha MartinFinish time: ____________ Date: ____________ Movements: ____________ Start time: ____________ Doreatha MartinFinish time: ____________ Date: ____________ Movements: ____________ Start time: ____________ Doreatha MartinFinish time: ____________   This information is not intended to replace advice given to you by your health care provider. Make sure you discuss any questions you have with your health care provider.   Document Released: 09/06/2006 Document Revised: 08/28/2014 Document Reviewed: 06/03/2012 Elsevier Interactive Patient Education 2016 [employer] Labor Induction Labor induction is when steps are taken to cause a pregnant woman to begin the labor process. Most women go into labor on their own between 37 weeks and 42 weeks of the pregnancy. When this does not happen or when there is a medical need, methods may be used to induce labor. Labor induction causes a pregnant woman's uterus to contract. It also causes the cervix to soften (ripen), open (dilate), and thin out (efface). Usually, labor is not induced before 39 weeks of the  pregnancy unless there is a problem with the baby or mother.  Before inducing labor, your health care provider will consider a number of factors, including the following:  The medical condition of you and the baby.   How many weeks along you are.   The status of the baby's lung maturity.   The condition of the cervix.   The position of the baby.  WHAT ARE THE REASONS FOR LABOR INDUCTION? Labor may be induced for the following reasons:  The health of the baby or mother is at risk.   The pregnancy is overdue by 1 week or more.   The water breaks but labor does not start on its own.   The mother has a health condition or serious illness, such as high blood pressure, infection, placental abruption, or diabetes.  The amniotic fluid amounts are low around the baby.   The baby is distressed.  Convenience or wanting the baby to be born on a certain date is not a reason for inducing labor. WHAT METHODS ARE USED FOR LABOR INDUCTION? Several methods of labor induction may be used, such as:   Prostaglandin  medicine. This medicine causes the cervix to dilate and ripen. The medicine will also start contractions. It can be taken by mouth or by inserting a suppository into the vagina.   Inserting a thin tube (catheter) with a balloon on the end into the vagina to dilate the cervix. Once inserted, the balloon is expanded with water, which causes the cervix to open.   Stripping the membranes. Your health care provider separates amniotic sac tissue from the cervix, causing the cervix to be stretched and causing the release of a hormone called progesterone. This may cause the uterus to contract. It is often done during an office visit. You will be sent home to wait for the contractions to begin. You will then come in for an induction.   Breaking the water. Your health care provider makes a hole in the amniotic sac using a small instrument. Once the amniotic sac breaks, contractions  should begin. This may still take hours to see an effect.   Medicine to trigger or strengthen contractions. This medicine is given through an IV access tube inserted into a vein in your arm.  All of the methods of induction, besides stripping the membranes, will be done in the hospital. Induction is done in the hospital so that you and the baby can be carefully monitored.  HOW LONG DOES IT TAKE FOR LABOR TO BE INDUCED? Some inductions can take up to 2-3 days. Depending on the cervix, it usually takes less time. It takes longer when you are induced early in the pregnancy or if this is your first pregnancy. If a mother is still pregnant and the induction has been going on for 2-3 days, either the mother will be sent home or a cesarean delivery will be needed. WHAT ARE THE RISKS ASSOCIATED WITH LABOR INDUCTION? Some of the risks of induction include:   Changes in fetal heart rate, such as too high, too low, or erratic.   Fetal distress.   Chance of infection for the mother and baby.   Increased chance of having a cesarean delivery.   Breaking off (abruption) of the placenta from the uterus (rare).   Uterine rupture (very rare).  When induction is needed for medical reasons, the benefits of induction may outweigh the risks. WHAT ARE SOME REASONS FOR NOT INDUCING LABOR? Labor induction should not be done if:   It is shown that your baby does not tolerate labor.   You have had previous surgeries on your uterus, such as a myomectomy or the removal of fibroids.   Your placenta lies very low in the uterus and blocks the opening of the cervix (placenta previa).   Your baby is not in a head-down position.   The umbilical cord drops down into the birth canal in front of the baby. This could cut off the baby's blood and oxygen supply.   You have had a previous cesarean delivery.   There are unusual circumstances, such as the baby being extremely premature.    This information  is not intended to replace advice given to you by your health care provider. Make sure you discuss any questions you have with your health care provider.   Document Released: 12/27/2006 Document Revised: 08/28/2014 Document Reviewed: 03/06/2013 Elsevier Interactive Patient Education [employer]

## 2016-05-22 NOTE — MAU Note (Signed)
Pt has been to MAU 2 other times this week; c/o ucs since this AM around 1000; denies any SROM today;

## 2016-05-24 ENCOUNTER — Inpatient Hospital Stay (HOSPITAL_COMMUNITY): Payer: Medicaid Other | Admitting: Anesthesiology

## 2016-05-24 ENCOUNTER — Inpatient Hospital Stay (HOSPITAL_COMMUNITY)
Admission: AD | Admit: 2016-05-24 | Discharge: 2016-05-28 | DRG: 766 | Disposition: A | Discharge status: Home / Self Care | Payer: Medicaid Other | Source: Ambulatory Visit | Attending: Obstetrics and Gynecology | Admitting: Obstetrics and Gynecology

## 2016-05-24 ENCOUNTER — Encounter (HOSPITAL_COMMUNITY): Payer: Self-pay

## 2016-05-24 DIAGNOSIS — K219 Gastro-esophageal reflux disease without esophagitis: Secondary | ICD-10-CM | POA: Diagnosis present

## 2016-05-24 DIAGNOSIS — O4202 Full-term premature rupture of membranes, onset of labor within 24 hours of rupture: Secondary | ICD-10-CM | POA: Diagnosis present

## 2016-05-24 DIAGNOSIS — Z8249 Family history of ischemic heart disease and other diseases of the circulatory system: Secondary | ICD-10-CM

## 2016-05-24 DIAGNOSIS — Z3A40 40 weeks gestation of pregnancy: Secondary | ICD-10-CM

## 2016-05-24 DIAGNOSIS — Z880 Allergy status to penicillin: Secondary | ICD-10-CM

## 2016-05-24 DIAGNOSIS — Z87891 Personal history of nicotine dependence: Secondary | ICD-10-CM | POA: Diagnosis not present

## 2016-05-24 DIAGNOSIS — O99824 Streptococcus B carrier state complicating childbirth: Secondary | ICD-10-CM | POA: Diagnosis present

## 2016-05-24 DIAGNOSIS — Z823 Family history of stroke: Secondary | ICD-10-CM

## 2016-05-24 DIAGNOSIS — O9962 Diseases of the digestive system complicating childbirth: Secondary | ICD-10-CM | POA: Diagnosis present

## 2016-05-24 DIAGNOSIS — O324XX0 Maternal care for high head at term, not applicable or unspecified: Principal | ICD-10-CM | POA: Diagnosis present

## 2016-05-24 DIAGNOSIS — O4290 Premature rupture of membranes, unspecified as to length of time between rupture and onset of labor, unspecified weeks of gestation: Secondary | ICD-10-CM

## 2016-05-24 DIAGNOSIS — B951 Streptococcus, group B, as the cause of diseases classified elsewhere: Secondary | ICD-10-CM | POA: Diagnosis present

## 2016-05-24 DIAGNOSIS — Z98891 History of uterine scar from previous surgery: Secondary | ICD-10-CM

## 2016-05-24 LAB — CBC
HEMATOCRIT: 36.2 % (ref 36.0–46.0)
HEMOGLOBIN: 12.6 g/dL (ref 12.0–15.0)
MCH: 32.1 pg (ref 26.0–34.0)
MCHC: 34.8 g/dL (ref 30.0–36.0)
MCV: 92.3 fL (ref 78.0–100.0)
PLATELETS: 251 10*3/uL (ref 150–400)
RBC: 3.92 MIL/uL (ref 3.87–5.11)
RDW: 15 % (ref 11.5–15.5)
WBC: 11.7 10*3/uL — ABNORMAL HIGH (ref 4.0–10.5)

## 2016-05-24 LAB — RPR: RPR: NONREACTIVE

## 2016-05-24 LAB — TYPE AND SCREEN
ABO/RH(D): O POS
Antibody Screen: NEGATIVE

## 2016-05-24 LAB — POCT FERN TEST: POCT FERN TEST: POSITIVE

## 2016-05-24 MED ORDER — OXYTOCIN 40 UNITS IN LACTATED RINGERS INFUSION - SIMPLE MED
2.5000 [IU]/h | INTRAVENOUS | Status: DC
  Filled 2016-05-24: qty 1000

## 2016-05-24 MED ORDER — FENTANYL 2.5 MCG/ML BUPIVACAINE 1/10 % EPIDURAL INFUSION (WH - ANES)
14.0000 mL/h | INTRAMUSCULAR | Status: DC | PRN
  Administered 2016-05-24 (×4): 14 mL/h via EPIDURAL
  Filled 2016-05-24 (×3): qty 125

## 2016-05-24 MED ORDER — LIDOCAINE HCL (PF) 1 % IJ SOLN
INTRAMUSCULAR | Status: DC | PRN
  Administered 2016-05-24 (×2): 4 mL

## 2016-05-24 MED ORDER — EPHEDRINE 5 MG/ML INJ: 10.0000 mg | INTRAVENOUS | Status: DC | PRN

## 2016-05-24 MED ORDER — ACETAMINOPHEN 325 MG PO TABS: 650.0000 mg | ORAL_TABLET | ORAL | Status: DC | PRN

## 2016-05-24 MED ORDER — DIPHENHYDRAMINE HCL 50 MG/ML IJ SOLN: 12.5000 mg | INTRAMUSCULAR | Status: DC | PRN

## 2016-05-24 MED ORDER — OXYCODONE-ACETAMINOPHEN 5-325 MG PO TABS: 2.0000 | ORAL_TABLET | ORAL | Status: DC | PRN

## 2016-05-24 MED ORDER — HYDROXYZINE HCL 50 MG PO TABS: 50.0000 mg | ORAL_TABLET | Freq: Four times a day (QID) | ORAL | Status: DC | PRN

## 2016-05-24 MED ORDER — FENTANYL CITRATE (PF) 100 MCG/2ML IJ SOLN
100.0000 ug | INTRAMUSCULAR | Status: DC | PRN
  Administered 2016-05-24: 100 ug via INTRAVENOUS
  Filled 2016-05-24: qty 2

## 2016-05-24 MED ORDER — FENTANYL 2.5 MCG/ML BUPIVACAINE 1/10 % EPIDURAL INFUSION (WH - ANES): 14.0000 mL/h | INTRAMUSCULAR | Status: DC | PRN

## 2016-05-24 MED ORDER — OXYTOCIN BOLUS FROM INFUSION: 500.0000 mL | Freq: Once | INTRAVENOUS | Status: DC

## 2016-05-24 MED ORDER — CLINDAMYCIN PHOSPHATE 900 MG/50ML IV SOLN
900.0000 mg | Freq: Three times a day (TID) | INTRAVENOUS | Status: DC
Start: 2016-05-24 — End: 2016-05-25
  Administered 2016-05-24 – 2016-05-25 (×4): 900 mg via INTRAVENOUS
  Filled 2016-05-24 (×5): qty 50

## 2016-05-24 MED ORDER — TERBUTALINE SULFATE 1 MG/ML IJ SOLN: 0.2500 mg | Freq: Once | INTRAMUSCULAR | Status: DC | PRN

## 2016-05-24 MED ORDER — OXYTOCIN 40 UNITS IN LACTATED RINGERS INFUSION - SIMPLE MED
1.0000 m[IU]/min | INTRAVENOUS | Status: DC
  Administered 2016-05-24: 2 m[IU]/min via INTRAVENOUS

## 2016-05-24 MED ORDER — LIDOCAINE HCL (PF) 1 % IJ SOLN
30.0000 mL | INTRAMUSCULAR | Status: DC | PRN
  Filled 2016-05-24: qty 30

## 2016-05-24 MED ORDER — LACTATED RINGERS IV SOLN: 500.0000 mL | INTRAVENOUS | Status: DC | PRN

## 2016-05-24 MED ORDER — LACTATED RINGERS IV SOLN
500.0000 mL | Freq: Once | INTRAVENOUS | Status: AC
  Administered 2016-05-24: 500 mL via INTRAVENOUS

## 2016-05-24 MED ORDER — LACTATED RINGERS IV SOLN
INTRAVENOUS | Status: DC
  Administered 2016-05-24 (×2): via INTRAVENOUS
  Administered 2016-05-24: 125 mL/h via INTRAVENOUS
  Administered 2016-05-25: 02:00:00 via INTRAVENOUS

## 2016-05-24 MED ORDER — SOD CITRATE-CITRIC ACID 500-334 MG/5ML PO SOLN
30.0000 mL | ORAL | Status: DC | PRN
  Filled 2016-05-24: qty 15

## 2016-05-24 MED ORDER — ONDANSETRON HCL 4 MG/2ML IJ SOLN
4.0000 mg | Freq: Four times a day (QID) | INTRAMUSCULAR | Status: DC | PRN
  Administered 2016-05-24: 4 mg via INTRAVENOUS

## 2016-05-24 MED ORDER — OXYCODONE-ACETAMINOPHEN 5-325 MG PO TABS: 1.0000 | ORAL_TABLET | ORAL | Status: DC | PRN

## 2016-05-24 MED ORDER — PHENYLEPHRINE 40 MCG/ML (10ML) SYRINGE FOR IV PUSH (FOR BLOOD PRESSURE SUPPORT)
80.0000 ug | PREFILLED_SYRINGE | INTRAVENOUS | Status: DC | PRN
  Filled 2016-05-24: qty 10

## 2016-05-24 MED ORDER — PHENYLEPHRINE 40 MCG/ML (10ML) SYRINGE FOR IV PUSH (FOR BLOOD PRESSURE SUPPORT): 80.0000 ug | PREFILLED_SYRINGE | INTRAVENOUS | Status: DC | PRN

## 2016-05-24 MED ORDER — FLEET ENEMA 7-19 GM/118ML RE ENEM: 1.0000 | ENEMA | RECTAL | Status: DC | PRN

## 2016-05-24 NOTE — H&P (Signed)
Maria [name] is a 25 y.o. female, G1P0 @ 40.5 wks (as dated by sure LMP c/w ultrasound at 6.1 wks) presents 2/2 SROM at 0030, clear fluid, and ctxs q 3-5 minutes. +FM. Denies VB.  Pregnancy followed at [facility] since 6.5weeks.  Prenatal course significant for: 1. GBS positive 2) Allergy to PCN - rash  OB History    Gravida Para Term Preterm AB Living   1 0 0 0 0 0   SAB TAB Ectopic Multiple Live Births   0 0 0 0       Past Medical History:  Diagnosis Date  . Acute pancreatitis   . Asthma   . GERD (gastroesophageal reflux disease)   . Hemorrhoid   . Hx of varicella   . Ovarian cyst   . Recurrent boils    legs and buttocks.  staff infection, was neg  . UTI (urinary tract infection)    Past Surgical History:  Procedure Laterality Date  . TONSILLECTOMY    . WISDOM TOOTH EXTRACTION     Family History: family history includes Heart disease in her maternal grandfather; Hypertension in her maternal grandmother; Stroke in her maternal grandfather. Social History:  reports that she quit smoking about 2 years ago. Her smoking use included Cigarettes. She smoked 0.50 packs per day. She has quit using smokeless tobacco. She reports that she drinks about 0.6 oz of alcohol per week . She reports that she does not use drugs.     Maternal Diabetes: No Genetic Screening: Normal Maternal Ultrasounds/Referrals: Normal Fetal Ultrasounds or other Referrals:  None Maternal Substance Abuse:  No Significant Maternal Medications:  Meds include: Other: PNV, Diclegis, Hydroxyzine Significant Maternal Lab Results:  Lab values include: Group B Strep positive, Hbg 12.1 at 28 wks Other Comments:  None  ROS 10 Systems reviewed and are negative for acute change except as noted in the HPI.  History Exam  Dilation: 2.5 Effacement (%): 80 Station: -2 Exam by:: D. Simpson RN Blood pressure 121/68, pulse 88, temperature 98 F (36.7 C), resp. rate 18, last menstrual period 08/20/2015.  Physical Exam   Lungs CTA CV RRR ABd gravid, NT Ext no calf tenderness FHT cat 1 Toco q 3-5  Prenatal labs: ABO, Rh: O/Positive/-- (02/08 0000) Antibody: Negative (02/08 0000) Rubella: Immune (02/08 0000) RPR: Nonreactive (02/08 0000)  HBsAg: Negative (02/08 0000)  HIV: Non-reactive (02/08 0000)  GBS: Positive (09/18 0000)   Assessment/Plan: P0at 40 5/7 wks presenting in latent labor with SROM. Fetal status is reassuring with Cat 1 tracing. GBS positive - allergic to PCN (rash) - will treat with Clindamycin. Pain meds upon request. Anticipate SVD.   Maria [name] 05/24/2016, 1:56 AM

## 2016-05-24 NOTE — Anesthesia Preprocedure Evaluation (Signed)
Anesthesia Evaluation  Patient identified by MRN, date of birth, ID band Patient awake    Reviewed: Allergy & Precautions, NPO status , Patient's Chart, lab work & pertinent test results  Airway Mallampati: II  TM Distance: >3 FB Neck ROM: Full    Dental no notable dental hx.    Pulmonary asthma , former smoker,    Pulmonary exam normal breath sounds clear to auscultation       Cardiovascular negative cardio ROS Normal cardiovascular exam Rhythm:Regular Rate:Normal     Neuro/Psych negative neurological ROS  negative psych ROS   GI/Hepatic Neg liver ROS, GERD  ,  Endo/Other  negative endocrine ROS  Renal/GU negative Renal ROS     Musculoskeletal negative musculoskeletal ROS (+)   Abdominal (+) + obese,   Peds  Hematology negative hematology ROS (+)   Anesthesia Other Findings   Reproductive/Obstetrics (+) Pregnancy                             Anesthesia Physical Anesthesia Plan  ASA: II  Anesthesia Plan: Epidural   Post-op Pain Management:    Induction:   Airway Management Planned:   Additional Equipment:   Intra-op Plan:   Post-operative Plan:   Informed Consent: I have reviewed the patients History and Physical, chart, labs and discussed the procedure including the risks, benefits and alternatives for the proposed anesthesia with the patient or authorized representative who has indicated his/her understanding and acceptance.     Plan Discussed with:   Anesthesia Plan Comments:         Anesthesia Quick Evaluation

## 2016-05-24 NOTE — Anesthesia Pain Management Evaluation Note (Signed)
  [facility] Pain Management Visit Note  Patient: Maria [name], 25 y.o., female  "Hello I am a [facility] the [facility]. We have an anesthesia team available at all times to provide care throughout the hospital, including epidural management and anesthesia for C-section. I don't know your plan for the delivery whether it a natural birth, water birth, IV sedation, nitrous supplementation, doula or epidural, but we want to meet your pain goals."   1.Was your pain managed to your expectations on prior hospitalizations?   No prior hospitalizations  2.What is your expectation for pain management during this hospitalization?     Epidural and IV pain meds  3.How can we help you reach that goal? IV pain meds, epidural interest.  Information given regarding epidural.  Patient very uncomfortable and unable to enumerate a pain goal due to discomfort.  [name]&D RN made aware of patient's discomfort and desire for epidural.  Record the patient's initial score and the patient's pain goal.   Pain: 10  Pain Goal: Patient unable to give a number.  Very uncomfortable.  [name]&D RN made aware.  [facility] wants you to be able to say your pain was always managed very well.  Maria [name] 05/24/2016

## 2016-05-24 NOTE — Anesthesia Rounding Note (Signed)
  [facility] Epidural Rounding Note  Patient: Maria [name], 25 y.o., female  Patient's current pain level: 0  Agreed upon pain management level: 4  Epidural intervention: No   Comments:   Maria [name] 05/24/2016

## 2016-05-24 NOTE — Progress Notes (Signed)
Maria BarrDanielle [name] is a 25 y.o. G1P0000 at [id number] admitted for SROM and labor  Subjective: Patient comfortable with epidural but feels vaginal pressure.   Objective: BP 93/71   Pulse 87   Temp 97.9 F (36.6 C) (Oral)   Resp 18   Ht 5\' 1"  (1.549 m)   Wt 87.5 kg (193 lb)   LMP 08/20/2015   SpO2 96%   BMI 36.47 kg/m  No intake/output data recorded. No intake/output data recorded.  FHT:  FHR: 135 bpm, variability: moderate,  accelerations:  Present,  decelerations:  Absent UC:   irregular, every 3- 5 minutes SVE:   Dilation: 6 Effacement (%): 90 Station: -1 Exam by:: J.Thornton, RN  Updated exam by me at 12.15pm: 7/90%/-1.   Labs: Lab Results  Component Value Date   WBC 11.7 (H) 05/24/2016   HGB 12.6 05/24/2016   HCT 36.2 05/24/2016   MCV 92.3 05/24/2016   PLT 251 05/24/2016    Assessment / Plan: Spontaneous labor, progressing normally without augmentation  Labor: Progressing normally Fetal Wellbeing:  Category I Pain Control:  Epidural I/D:  GBS Positive on clindamycin Anticipated MOD:  NSVD  Konrad FelixKULWA,Maria Yogi WAKURU, MD.  05/24/2016, 12:27 PM

## 2016-05-24 NOTE — Progress Notes (Addendum)
  Subjective: No pain, but feeling pressure w/ each ctx.  Objective: BP 121/70   Pulse 88   Temp 99.7 F (37.6 C) (Axillary)   Resp 18   Ht 5\' 1"  (1.549 m)   Wt 87.5 kg (193 lb)   LMP 08/20/2015   SpO2 96%   BMI 36.47 kg/m  No intake/output data recorded. No intake/output data recorded.  FHT: BL 130 w/ moderate variability, +accels, no decels UC:   irregular, every 1-3 minutes SVE: C/C/0 w/ caput at +1 (8:03 pm)    Pitocin at 8 mU/min  Assessment:  2nd stage labor SROM x 19 1/2 hrs; no s/s of infection; max temp 99.7 GBS positive (adequately treated w/ Clindamycin) PCN allergy  Plan: Poor trial push. Allow passive descent x 1 hr.  Sherre ScarletWILLIAMS, Brenin Heidelberger CNM 05/24/2016, 8:17 PM

## 2016-05-24 NOTE — Anesthesia Procedure Notes (Signed)
Epidural Patient location during procedure: OB  Staffing Anesthesiologist: Makailee Nudelman Performed: anesthesiologist   Preanesthetic Checklist Completed: patient identified, pre-op evaluation, timeout performed, IV checked, risks and benefits discussed and monitors and equipment checked  Epidural Patient position: sitting Prep: site prepped and draped and DuraPrep Patient monitoring: heart rate Approach: midline Location: L3-L4 Injection technique: LOR air and LOR saline  Needle:  Needle type: Tuohy  Needle gauge: 17 G Needle length: 9 cm Needle insertion depth: 6 cm Catheter type: closed end flexible Catheter size: 19 Gauge Catheter at skin depth: 12 cm Test dose: negative  Assessment Sensory level: T8 Events: blood not aspirated, injection not painful, no injection resistance, negative IV test and no paresthesia  Additional Notes Reason for block:procedure for pain     

## 2016-05-24 NOTE — MAU Note (Signed)
Pt presents complaining of contractions every 3-5 minutes and possible SROM at 0030. 2cm in office today. Denies bleeding.

## 2016-05-24 NOTE — Progress Notes (Signed)
Notified of pt arrival in MAU, exam, and SROM with positive fern. Pt has penicillin allergy and is GBS positive. CNM will put in orders to admit to labor and delivery

## 2016-05-24 NOTE — Progress Notes (Signed)
  Subjective: Still feeling pressure, but no real urge to push.  Objective: BP 112/62   Pulse 94   Temp 99.1 F (37.3 C) (Oral)   Resp 18   Ht 5\' 1"  (1.549 m)   Wt 87.5 kg (193 lb)   LMP 08/20/2015   SpO2 96%   BMI 36.47 kg/m  No intake/output data recorded. No intake/output data recorded.  FHT: BL 130 w/ moderate variability, +accels, no decels UC:   irregular, every 2-3 minutes SVE: Unchanged   Pitocin at 6 mU/min  Assessment:  2nd stage labor  Plan: Begin coached pushing  Sherre ScarletWILLIAMS, Wilder Kurowski CNM 05/24/2016, 9:30 PM

## 2016-05-25 ENCOUNTER — Encounter (HOSPITAL_COMMUNITY)
Admission: AD | Disposition: A | Discharge status: Home / Self Care | Payer: Self-pay | Source: Ambulatory Visit | Attending: Obstetrics and Gynecology

## 2016-05-25 ENCOUNTER — Encounter (HOSPITAL_COMMUNITY): Payer: Self-pay | Admitting: Anesthesiology

## 2016-05-25 DIAGNOSIS — Z98891 History of uterine scar from previous surgery: Secondary | ICD-10-CM

## 2016-05-25 SURGERY — Surgical Case
Anesthesia: Epidural | Site: Abdomen | Wound class: Clean Contaminated

## 2016-05-25 MED ORDER — SIMETHICONE 80 MG PO CHEW
80.0000 mg | CHEWABLE_TABLET | ORAL | Status: DC
  Administered 2016-05-25 – 2016-05-27 (×3): 80 mg via ORAL
  Filled 2016-05-25 (×3): qty 1

## 2016-05-25 MED ORDER — ACETAMINOPHEN 500 MG PO TABS
1000.0000 mg | ORAL_TABLET | Freq: Four times a day (QID) | ORAL | Status: AC
  Administered 2016-05-25 – 2016-05-26 (×3): 1000 mg via ORAL
  Filled 2016-05-25 (×4): qty 2

## 2016-05-25 MED ORDER — METHYLERGONOVINE MALEATE 0.2 MG PO TABS: 0.2000 mg | ORAL_TABLET | ORAL | Status: DC | PRN

## 2016-05-25 MED ORDER — LIDOCAINE-EPINEPHRINE (PF) 2 %-1:200000 IJ SOLN
INTRAMUSCULAR | Status: DC | PRN
  Administered 2016-05-25 (×3): 5 mL via INTRADERMAL

## 2016-05-25 MED ORDER — LIDOCAINE-EPINEPHRINE (PF) 2 %-1:200000 IJ SOLN
INTRAMUSCULAR | Status: AC
  Filled 2016-05-25: qty 20

## 2016-05-25 MED ORDER — KETOROLAC TROMETHAMINE 30 MG/ML IJ SOLN
30.0000 mg | Freq: Four times a day (QID) | INTRAMUSCULAR | Status: AC | PRN
  Administered 2016-05-25: 30 mg via INTRAVENOUS
  Filled 2016-05-25: qty 1

## 2016-05-25 MED ORDER — ONDANSETRON HCL 4 MG/2ML IJ SOLN
INTRAMUSCULAR | Status: DC | PRN
  Administered 2016-05-25: 4 mg via INTRAVENOUS

## 2016-05-25 MED ORDER — OXYTOCIN 10 UNIT/ML IJ SOLN
INTRAVENOUS | Status: DC | PRN
  Administered 2016-05-25: 40 [IU] via INTRAVENOUS

## 2016-05-25 MED ORDER — PHENYLEPHRINE HCL 10 MG/ML IJ SOLN
INTRAMUSCULAR | Status: DC | PRN
  Administered 2016-05-25: 40 ug via INTRAVENOUS

## 2016-05-25 MED ORDER — OXYCODONE HCL 5 MG/5ML PO SOLN: 5.0000 mg | Freq: Once | ORAL | Status: DC | PRN

## 2016-05-25 MED ORDER — FERROUS SULFATE 325 (65 FE) MG PO TABS
325.0000 mg | ORAL_TABLET | Freq: Two times a day (BID) | ORAL | Status: DC
  Administered 2016-05-25 – 2016-05-28 (×6): 325 mg via ORAL
  Filled 2016-05-25 (×8): qty 1

## 2016-05-25 MED ORDER — ONDANSETRON HCL 4 MG/2ML IJ SOLN
4.0000 mg | Freq: Three times a day (TID) | INTRAMUSCULAR | Status: DC | PRN
Start: 2016-05-25 — End: 2016-05-28

## 2016-05-25 MED ORDER — DEXAMETHASONE SODIUM PHOSPHATE 10 MG/ML IJ SOLN
INTRAMUSCULAR | Status: AC
  Filled 2016-05-25: qty 1

## 2016-05-25 MED ORDER — NALBUPHINE HCL 10 MG/ML IJ SOLN
5.0000 mg | INTRAMUSCULAR | Status: DC | PRN
Start: 2016-05-25 — End: 2016-05-28

## 2016-05-25 MED ORDER — ACETAMINOPHEN 325 MG PO TABS
650.0000 mg | ORAL_TABLET | ORAL | Status: DC | PRN
  Administered 2016-05-27 – 2016-05-28 (×2): 650 mg via ORAL
  Filled 2016-05-25 (×3): qty 2

## 2016-05-25 MED ORDER — PROMETHAZINE HCL 25 MG/ML IJ SOLN: 6.2500 mg | INTRAMUSCULAR | Status: DC | PRN

## 2016-05-25 MED ORDER — DIPHENHYDRAMINE HCL 25 MG PO CAPS
25.0000 mg | ORAL_CAPSULE | ORAL | Status: DC | PRN
Start: 2016-05-25 — End: 2016-05-28
  Filled 2016-05-25: qty 1

## 2016-05-25 MED ORDER — KETOROLAC TROMETHAMINE 30 MG/ML IJ SOLN
INTRAMUSCULAR | Status: AC
  Administered 2016-05-25: 30 mg via INTRAVENOUS
  Filled 2016-05-25: qty 1

## 2016-05-25 MED ORDER — SIMETHICONE 80 MG PO CHEW
80.0000 mg | CHEWABLE_TABLET | ORAL | Status: DC | PRN
  Administered 2016-05-27: 80 mg via ORAL
  Filled 2016-05-25 (×2): qty 1

## 2016-05-25 MED ORDER — NALOXONE HCL 2 MG/2ML IJ SOSY
1.0000 ug/kg/h | PREFILLED_SYRINGE | INTRAVENOUS | Status: DC | PRN
  Filled 2016-05-25: qty 2

## 2016-05-25 MED ORDER — LACTATED RINGERS IV SOLN
INTRAVENOUS | Status: DC
  Administered 2016-05-25 (×2): via INTRAVENOUS

## 2016-05-25 MED ORDER — ALBUTEROL SULFATE (2.5 MG/3ML) 0.083% IN NEBU: 3.0000 mL | INHALATION_SOLUTION | Freq: Four times a day (QID) | RESPIRATORY_TRACT | Status: DC | PRN

## 2016-05-25 MED ORDER — MEPERIDINE HCL 25 MG/ML IJ SOLN
6.2500 mg | INTRAMUSCULAR | Status: DC | PRN
Start: 2016-05-25 — End: 2016-05-25

## 2016-05-25 MED ORDER — PHENYLEPHRINE 40 MCG/ML (10ML) SYRINGE FOR IV PUSH (FOR BLOOD PRESSURE SUPPORT)
PREFILLED_SYRINGE | INTRAVENOUS | Status: AC
  Filled 2016-05-25: qty 10

## 2016-05-25 MED ORDER — DIPHENHYDRAMINE HCL 25 MG PO CAPS: 25.0000 mg | ORAL_CAPSULE | Freq: Four times a day (QID) | ORAL | Status: DC | PRN

## 2016-05-25 MED ORDER — DEXAMETHASONE SODIUM PHOSPHATE 10 MG/ML IJ SOLN
INTRAMUSCULAR | Status: DC | PRN
  Administered 2016-05-25: 10 mg via INTRAVENOUS

## 2016-05-25 MED ORDER — METHYLERGONOVINE MALEATE 0.2 MG/ML IJ SOLN: 0.2000 mg | INTRAMUSCULAR | Status: DC | PRN

## 2016-05-25 MED ORDER — SENNOSIDES-DOCUSATE SODIUM 8.6-50 MG PO TABS
2.0000 | ORAL_TABLET | ORAL | Status: DC
Start: 2016-05-26 — End: 2016-05-28
  Administered 2016-05-25 – 2016-05-27 (×3): 2 via ORAL
  Filled 2016-05-25 (×3): qty 2

## 2016-05-25 MED ORDER — SODIUM CHLORIDE 0.9 % IR SOLN
Status: DC | PRN
  Administered 2016-05-25: 1

## 2016-05-25 MED ORDER — SIMETHICONE 80 MG PO CHEW
80.0000 mg | CHEWABLE_TABLET | Freq: Three times a day (TID) | ORAL | Status: DC
  Administered 2016-05-25 – 2016-05-28 (×7): 80 mg via ORAL
  Filled 2016-05-25 (×8): qty 1

## 2016-05-25 MED ORDER — NALOXONE HCL 0.4 MG/ML IJ SOLN: 0.4000 mg | INTRAMUSCULAR | Status: DC | PRN

## 2016-05-25 MED ORDER — FENTANYL CITRATE (PF) 100 MCG/2ML IJ SOLN: 25.0000 ug | INTRAMUSCULAR | Status: DC | PRN

## 2016-05-25 MED ORDER — PRENATAL MULTIVITAMIN CH
1.0000 | ORAL_TABLET | Freq: Every day | ORAL | Status: DC
  Administered 2016-05-26 – 2016-05-28 (×3): 1 via ORAL
  Filled 2016-05-25 (×4): qty 1

## 2016-05-25 MED ORDER — LACTATED RINGERS IV SOLN
INTRAVENOUS | Status: DC | PRN
  Administered 2016-05-25: 02:00:00 via INTRAVENOUS

## 2016-05-25 MED ORDER — MENTHOL 3 MG MT LOZG: 1.0000 | LOZENGE | OROMUCOSAL | Status: DC | PRN

## 2016-05-25 MED ORDER — GENTAMICIN SULFATE 40 MG/ML IJ SOLN
5.0000 mg/kg | Freq: Once | INTRAMUSCULAR | Status: AC
  Administered 2016-05-25: 320 mg via INTRAVENOUS
  Filled 2016-05-25: qty 8

## 2016-05-25 MED ORDER — SCOPOLAMINE 1 MG/3DAYS TD PT72: 1.0000 | MEDICATED_PATCH | Freq: Once | TRANSDERMAL | Status: DC

## 2016-05-25 MED ORDER — MORPHINE SULFATE (PF) 0.5 MG/ML IJ SOLN
INTRAMUSCULAR | Status: AC
  Filled 2016-05-25: qty 10

## 2016-05-25 MED ORDER — OXYTOCIN 10 UNIT/ML IJ SOLN
INTRAMUSCULAR | Status: AC
  Filled 2016-05-25: qty 4

## 2016-05-25 MED ORDER — SODIUM BICARBONATE 8.4 % IV SOLN
INTRAVENOUS | Status: AC
  Filled 2016-05-25: qty 50

## 2016-05-25 MED ORDER — SODIUM CHLORIDE 0.9% FLUSH: 3.0000 mL | INTRAVENOUS | Status: DC | PRN

## 2016-05-25 MED ORDER — NALBUPHINE HCL 10 MG/ML IJ SOLN: 5.0000 mg | INTRAMUSCULAR | Status: DC | PRN

## 2016-05-25 MED ORDER — ZOLPIDEM TARTRATE 5 MG PO TABS: 5.0000 mg | ORAL_TABLET | Freq: Every evening | ORAL | Status: DC | PRN

## 2016-05-25 MED ORDER — OXYCODONE HCL 5 MG PO TABS
10.0000 mg | ORAL_TABLET | ORAL | Status: DC | PRN
  Administered 2016-05-25 – 2016-05-26 (×2): 10 mg via ORAL
  Filled 2016-05-25 (×3): qty 2

## 2016-05-25 MED ORDER — OXYTOCIN 40 UNITS IN LACTATED RINGERS INFUSION - SIMPLE MED: 2.5000 [IU]/h | INTRAVENOUS | Status: AC

## 2016-05-25 MED ORDER — COCONUT OIL OIL: 1.0000 "application " | TOPICAL_OIL | Status: DC | PRN

## 2016-05-25 MED ORDER — ONDANSETRON HCL 4 MG/2ML IJ SOLN
INTRAMUSCULAR | Status: AC
  Filled 2016-05-25: qty 2

## 2016-05-25 MED ORDER — SCOPOLAMINE 1 MG/3DAYS TD PT72
MEDICATED_PATCH | TRANSDERMAL | Status: AC
  Filled 2016-05-25: qty 1

## 2016-05-25 MED ORDER — DIBUCAINE 1 % RE OINT: 1.0000 "application " | TOPICAL_OINTMENT | RECTAL | Status: DC | PRN

## 2016-05-25 MED ORDER — KETOROLAC TROMETHAMINE 30 MG/ML IJ SOLN
30.0000 mg | Freq: Four times a day (QID) | INTRAMUSCULAR | Status: AC | PRN
  Administered 2016-05-25: 30 mg via INTRAMUSCULAR

## 2016-05-25 MED ORDER — DIPHENHYDRAMINE HCL 50 MG/ML IJ SOLN: 12.5000 mg | INTRAMUSCULAR | Status: DC | PRN

## 2016-05-25 MED ORDER — OXYCODONE HCL 5 MG PO TABS: 5.0000 mg | ORAL_TABLET | ORAL | Status: DC | PRN

## 2016-05-25 MED ORDER — OXYCODONE HCL 5 MG PO TABS: 5.0000 mg | ORAL_TABLET | Freq: Once | ORAL | Status: DC | PRN

## 2016-05-25 MED ORDER — NALBUPHINE HCL 10 MG/ML IJ SOLN: 5.0000 mg | Freq: Once | INTRAMUSCULAR | Status: DC | PRN

## 2016-05-25 MED ORDER — MORPHINE SULFATE (PF) 0.5 MG/ML IJ SOLN
INTRAMUSCULAR | Status: DC | PRN
  Administered 2016-05-25: 2 mg via INTRAVENOUS
  Administered 2016-05-25: 3 mg via EPIDURAL

## 2016-05-25 MED ORDER — SCOPOLAMINE 1 MG/3DAYS TD PT72
MEDICATED_PATCH | TRANSDERMAL | Status: DC | PRN
  Administered 2016-05-25: 1 via TRANSDERMAL

## 2016-05-25 MED ORDER — WITCH HAZEL-GLYCERIN EX PADS: 1.0000 "application " | MEDICATED_PAD | CUTANEOUS | Status: DC | PRN

## 2016-05-25 MED ORDER — IBUPROFEN 600 MG PO TABS
600.0000 mg | ORAL_TABLET | Freq: Four times a day (QID) | ORAL | Status: DC
Start: 2016-05-25 — End: 2016-05-28
  Administered 2016-05-25 – 2016-05-28 (×11): 600 mg via ORAL
  Filled 2016-05-25 (×12): qty 1

## 2016-05-25 SURGICAL SUPPLY — 37 items
BARRIER ADHS 3X4 INTERCEED (GAUZE/BANDAGES/DRESSINGS) ×3 IMPLANT
BENZOIN TINCTURE PRP APPL 2/3 (GAUZE/BANDAGES/DRESSINGS) ×3 IMPLANT
CHLORAPREP W/TINT 26ML (MISCELLANEOUS) ×3 IMPLANT
CLAMP CORD UMBIL (MISCELLANEOUS) IMPLANT
CLOSURE WOUND 1/2 X4 (GAUZE/BANDAGES/DRESSINGS) ×1
CLOTH BEACON ORANGE TIMEOUT ST (SAFETY) ×3 IMPLANT
CONTAINER PREFILL 10% NBF 15ML (MISCELLANEOUS) IMPLANT
DRSG OPSITE POSTOP 4X10 (GAUZE/BANDAGES/DRESSINGS) ×3 IMPLANT
ELECT REM PT RETURN 9FT ADLT (ELECTROSURGICAL) ×3
ELECTRODE REM PT RTRN 9FT ADLT (ELECTROSURGICAL) ×1 IMPLANT
EXTRACTOR VACUUM KIWI (MISCELLANEOUS) IMPLANT
GLOVE BIOGEL M 6.5 STRL (GLOVE) ×6 IMPLANT
GLOVE BIOGEL PI IND STRL 6.5 (GLOVE) ×1 IMPLANT
GLOVE BIOGEL PI IND STRL 7.0 (GLOVE) ×1 IMPLANT
GLOVE BIOGEL PI INDICATOR 6.5 (GLOVE) ×2
GLOVE BIOGEL PI INDICATOR 7.0 (GLOVE) ×2
GOWN STRL REUS W/TWL LRG LVL3 (GOWN DISPOSABLE) ×9 IMPLANT
KIT ABG SYR 3ML LUER SLIP (SYRINGE) IMPLANT
NEEDLE HYPO 25X5/8 SAFETYGLIDE (NEEDLE) IMPLANT
NS IRRIG 1000ML POUR BTL (IV SOLUTION) ×3 IMPLANT
PACK C SECTION WH (CUSTOM PROCEDURE TRAY) ×3 IMPLANT
PAD ABD 7.5X8 STRL (GAUZE/BANDAGES/DRESSINGS) ×3 IMPLANT
PAD OB MATERNITY 4.3X12.25 (PERSONAL CARE ITEMS) ×3 IMPLANT
PENCIL SMOKE EVAC W/HOLSTER (ELECTROSURGICAL) ×3 IMPLANT
RTRCTR C-SECT PINK 25CM LRG (MISCELLANEOUS) IMPLANT
STRIP CLOSURE SKIN 1/2X4 (GAUZE/BANDAGES/DRESSINGS) ×2 IMPLANT
SUT PDS AB 0 CT1 27 (SUTURE) ×6 IMPLANT
SUT PLAIN 0 NONE (SUTURE) IMPLANT
SUT VIC AB 0 CTX 36 (SUTURE) ×6
SUT VIC AB 0 CTX36XBRD ANBCTRL (SUTURE) ×3 IMPLANT
SUT VIC AB 2-0 CT1 27 (SUTURE) ×2
SUT VIC AB 2-0 CT1 TAPERPNT 27 (SUTURE) ×1 IMPLANT
SUT VIC AB 3-0 SH 27 (SUTURE)
SUT VIC AB 3-0 SH 27X BRD (SUTURE) IMPLANT
SUT VIC AB 4-0 KS 27 (SUTURE) ×3 IMPLANT
TOWEL OR 17X24 6PK STRL BLUE (TOWEL DISPOSABLE) ×3 IMPLANT
TRAY FOLEY CATH SILVER 14FR (SET/KITS/TRAYS/PACK) ×3 IMPLANT

## 2016-05-25 NOTE — Lactation Note (Signed)
This note was copied from a baby's chart. Lactation Consultation Note  Patient Name: Maria [name] EGBTD'V Date: 05/25/2016 Reason for consult: Initial assessment Baby at 15 hr of life. Mom has large, semi firm breast with very short shaft nipples. She has shells but no bra, made a mesh top for her to wear the shells. She had the DEBP kit in the room but did not know how to set it up. Her mom and friend are present and supportive. Discussed baby behavior, feeding frequency, baby belly size, voids, wt loss, breast changes, and nipple care. Demonstrated manual expression, colostrum noted bilaterally, spoon in room. Given lactation handouts. Aware of OP services and support group.     Maternal Data    Feeding Feeding Type: Breast Fed Length of feed: 0 min  LATCH Score/Interventions Latch: Too sleepy or reluctant, no latch achieved, no sucking elicited.                    Lactation Tools Discussed/Used WIC Program: Yes Pump Review: Setup, frequency, and cleaning;Milk Storage Initiated by:: ES Date initiated:: 05/25/16   Consult Status Consult Status: Follow-up Date: 05/25/16 Follow-up type: In-patient    Denzil Hughes 05/25/2016, 5:48 PM

## 2016-05-25 NOTE — Progress Notes (Signed)
Patient has been complete and pushing for 3 hours with failure to descend. Recommend cesarean section for failure to descend. R/B/A of cesarean section were discussed with the patient including and not limited to infection, bleeding, damage to bowel bladder and baby with the need for further surgery. R/O transfusion HIV / Hep B&C discussed. Pt voiced understanding and desires to proceed with Cesarean section.

## 2016-05-25 NOTE — Lactation Note (Signed)
This note was copied from a baby's chart. Lactation Consultation Note Called to PACU. Mom has large very "PERKY" breast w/flat nipples. Reverse pressure to areola showed little difference. Tiny no shaft nipple had slight elevation, but not enough for latching. Breast are not compressible at all. Breast feel as if has implants. Mom denies implants. Noted thickness of chest muscle high up into breast base. Breast are firm cone "bullet" shaped breast. LC didn't see incisions as if had surgery. Mom stated she wears push up bras all the time. (they are unually firm and upright)  Hand pump to nipple w/o difference. Took DEBP to room as well as shells. Will educate more when return to room and baby is more interested in BF.  Patient Name: Maria [name]: 05/25/2016 Reason for consult: Initial assessment   Maternal Data Has patient been taught Hand Expression?: Yes Does the patient have breastfeeding experience prior to this delivery?: No  Feeding Feeding Type: Breast Fed  LATCH Score/Interventions Latch: Too sleepy or reluctant, no latch achieved, no sucking elicited. Intervention(s): Skin to skin;Teach feeding cues;Waking techniques  Audible Swallowing: None Intervention(s): Hand expression;Skin to skin  Type of Nipple: Flat Intervention(s): Hand pump (R nipple more erect after hand pump but needs NS per LC)  Comfort (Breast/Nipple): Soft / non-tender     Hold (Positioning): Full assist, staff holds infant at breast Intervention(s): Skin to skin  LATCH Score: 3  Lactation Tools Discussed/Used     Consult Status Consult Status: Follow-up [name]: 05/25/16 Follow-up type: In-patient    Maria DancerCARVER, Maria Wierzbicki G 05/25/2016, 5:51 AM

## 2016-05-25 NOTE — Anesthesia Postprocedure Evaluation (Signed)
Anesthesia Post Note  Patient: Maria [name]  Procedure(s) Performed: Procedure(s) (LRB): CESAREAN SECTION (N/A)  Patient location during evaluation: Mother Baby Anesthesia Type: Epidural Level of consciousness: awake and alert Pain management: pain level controlled Vital Signs Assessment: post-procedure vital signs reviewed and stable Respiratory status: spontaneous breathing and nonlabored ventilation Cardiovascular status: stable Postop Assessment: no headache, no backache, patient able to bend at knees, epidural receding, no signs of nausea or vomiting and adequate PO intake Anesthetic complications: no     Last Vitals:  Vitals:   05/25/16 0633 05/25/16 0730  BP: (!) 103/51 110/62  Pulse: 81 82  Resp: 16 16  Temp: 37.1 C 36.8 C    Last Pain:  Vitals:   05/25/16 0730  TempSrc: Oral  PainSc:    Pain Goal:                 Land O'LakesMalinova,Wajiha Versteeg Hristova

## 2016-05-25 NOTE — Anesthesia Postprocedure Evaluation (Signed)
Anesthesia Post Note  Patient: Karle BarrDanielle Rufener  Procedure(s) Performed: Procedure(s) (LRB): CESAREAN SECTION (N/A)  Patient location during evaluation: Mother Baby Anesthesia Type: Epidural Level of consciousness: awake and alert Pain management: pain level controlled Vital Signs Assessment: post-procedure vital signs reviewed and stable Respiratory status: spontaneous breathing, nonlabored ventilation and respiratory function stable Cardiovascular status: stable Postop Assessment: no headache, no backache and epidural receding Anesthetic complications: no     Last Vitals:  Vitals:   05/25/16 0417 05/25/16 0436  BP:  114/71  Pulse:  82  Resp:  16  Temp: 36.8 C 36.8 C    Last Pain:  Vitals:   05/25/16 0504  TempSrc:   PainSc: 8    Pain Goal:                 Bonita Quinichard S Romeo Zielinski

## 2016-05-25 NOTE — Op Note (Signed)
Cesarean Section Procedure Note  Indications: failure to progress: arrest of descent  Pre-operative Diagnosis: 40 week 6 day pregnancy.  Post-operative Diagnosis: same  Surgeon: Jessee AversOLE,Natajah Derderian J.   Assistants: Sherre ScarletKimberly Williams CNM  Anesthesia: Epidural anesthesia and Spinal anesthesia  ASA Class: 2   Procedure Details   The patient was seen in the Holding Room. The risks, benefits, complications, treatment options, and expected outcomes were discussed with the patient.  The patient concurred with the proposed plan, giving informed consent.  The site of surgery properly noted/marked. The patient was taken to Operating Room # 9, identified as Maria [name] and the procedure verified as C-Section Delivery. A Time Out was held and the above information confirmed.  After induction of anesthesia, the patient was draped and prepped in the usual sterile manner. A Pfannenstiel incision was made and carried down through the subcutaneous tissue to the fascia. Fascial incision was made and extended transversely. The fascia was separated from the underlying rectus tissue superiorly and inferiorly. The peritoneum was identified and entered. Peritoneal incision was extended longitudinally. The utero-vesical peritoneal reflection was incised transversely and the bladder flap was bluntly freed from the lower uterine segment. A low transverse uterine incision was made. Delivered from cephalic presentation was a  Female with Apgar scores of 7 at one minute and 9 at five minutes. After the umbilical cord was clamped and cut cord blood was obtained for evaluation. The placenta was removed intact and appeared normal. The uterine outline, tubes and ovaries appeared normal. The uterine incision was closed with running locked sutures of 0 vicryl. A second layer of 0 vicryl was used to imbricate the incision. . Hemostasis was observed. Lavage was carried out until clear.  Interseed was placed along the fascial incision. The  fascia was then reapproximated with running sutures of 0 pds. The skin was reapproximated with 4-0 vicryl .  Instrument, sponge, and needle counts were correct prior the abdominal closure and at the conclusion of the case.   Findings: Female infant in the cephalic presentation. Normal fallopian tubes and ovaries.   Estimated Blood Loss:  600 mL         Drains: None         Total IV Fluids:  Per anesthesia ml         Specimens: Placenta and sent to labor and delivery            Implants: none         Complications:  None; patient tolerated the procedure well.         Disposition: PACU - hemodynamically stable.         Condition: stable  Attending Attestation: I performed the procedure.

## 2016-05-25 NOTE — Progress Notes (Signed)
  Subjective: Exhausted. Epidural no longer working.  Objective: BP 116/65   Pulse 97   Temp 99.1 F (37.3 C) (Oral)   Resp 18   Ht 5\' 1"  (1.549 m)   Wt 87.5 kg (193 lb)   LMP 08/20/2015   SpO2 96%   BMI 36.47 kg/m  No intake/output data recorded. No intake/output data recorded.  FHT: Category 1 UC:   irregular, every 1-3 minutes SVE: Unchanged    Pitocin off  Assessment:  Arrest of descent SROM x 24+ hrs; temp 99.1 GBS positive  Plan: Has been pushing x 3 hrs w/o descent. +caput. Given option to rest and have anesthesia redose epidural, or have c-section. Pt elects latter. Dr. Richardson Doppole made aware and enroute to see pt. Risks, Benefits, Alternatives including but not limited to bleeding, infection and injury were discussed with the patient. Patient verbalized understanding and consent signed and witnessed. SCDs. On Clindamycin for GBS positive status. Allergy to PCN (rash) - Ancef contraindicated.   Sherre ScarletWILLIAMS, Rudy Domek CNM 05/25/2016, 1:11 AM

## 2016-05-25 NOTE — Transfer of Care (Signed)
Immediate Anesthesia Transfer of Care Note  Patient: Maria [name]  Procedure(s) Performed: Procedure(s): CESAREAN SECTION (N/A)  Patient Location: PACU  Anesthesia Type:Epidural  Level of Consciousness: awake, alert , oriented and patient cooperative  Airway & Oxygen Therapy: Patient Spontanous Breathing  Post-op Assessment: Report given to RN and Post -op Vital signs reviewed and stable  Post vital signs: Reviewed and stable  Last Vitals:  Vitals:   05/25/16 0131 05/25/16 0136  BP: 128/65 111/67  Pulse: 93 (!) 104  Resp:    Temp:      Last Pain:  Vitals:   05/24/16 2331  TempSrc: Oral  PainSc:          Complications: No apparent anesthesia complications

## 2016-05-25 NOTE — Addendum Note (Signed)
Addendum  created 05/25/16 0742 by Arbadella Kimbler H Genavie Boettger, CRNA   Sign clinical note    

## 2016-05-26 ENCOUNTER — Inpatient Hospital Stay (HOSPITAL_COMMUNITY): Admission: RE | Admit: 2016-05-26 | Payer: Medicaid Other | Source: Ambulatory Visit

## 2016-05-26 LAB — BIRTH TISSUE RECOVERY COLLECTION (PLACENTA DONATION)

## 2016-05-26 LAB — CBC
HCT: 28.7 % — ABNORMAL LOW (ref 36.0–46.0)
HEMOGLOBIN: 9.8 g/dL — AB (ref 12.0–15.0)
MCH: 31.8 pg (ref 26.0–34.0)
MCHC: 34.1 g/dL (ref 30.0–36.0)
MCV: 93.2 fL (ref 78.0–100.0)
Platelets: 216 10*3/uL (ref 150–400)
RBC: 3.08 MIL/uL — AB (ref 3.87–5.11)
RDW: 15.6 % — ABNORMAL HIGH (ref 11.5–15.5)
WBC: 14.8 10*3/uL — AB (ref 4.0–10.5)

## 2016-05-26 NOTE — Lactation Note (Addendum)
This note was copied from a baby's chart. Lactation Consultation Note:    Mother paged for latch assist. Mother had very small flat nipples. They go flat when compressed. Infant screaming at the breast with mouth wide open. Infant has a recessed chin. Mother was fit with a #16 nipple shield. Infant latched on and off the nipple shield for 5 mins. I had to take the infant off the breast because mother needed to get EKG. Mother to page for more assistance.  Patient Name: Boy Karle BarrDanielle Beharry NWGNF'AToday's Date: 05/26/2016 Reason for consult: Follow-up assessment   Maternal Data    Feeding Feeding Type: Breast Fed Length of feed: 5 min (5 mins on and off)  LATCH Score/Interventions Latch: Repeated attempts needed to sustain latch, nipple held in mouth throughout feeding, stimulation needed to elicit sucking reflex. Intervention(s): Skin to skin Intervention(s): Adjust position;Assist with latch  Audible Swallowing: None  Type of Nipple: Flat (small flat nipples) Intervention(s): Shells;Double electric pump  Comfort (Breast/Nipple): Soft / non-tender     Hold (Positioning): Assistance needed to correctly position infant at breast and maintain latch. Intervention(s): Support Pillows;Position options;Skin to skin  LATCH Score: 5  Lactation Tools Discussed/Used Tools: Nipple Shields Nipple shield size: 16   Consult Status Consult Status: Follow-up Date: 05/27/16 Follow-up type: In-patient    Stevan BornKendrick, Romolo Sieling [facility] 05/26/2016, 3:28 PM

## 2016-05-26 NOTE — Lactation Note (Signed)
This note was copied from a baby's chart. Lactation Consultation Note: on lactation visit mother was having lunch. Mother states that infant is feeding better. She states that she is not having as much problem with the left breast as the right. The right nipple is flat . Mother will page for lactation serviced when she attempts to feed again so latch can be checked. Mother states that infant has not breastfed since 7 this am. She has attempt several times . She just finished trying to feeding. Infant is being cradle with eye open wide looking around. Mother states she will call.    Patient Name: Maria Karle BarrDanielle Peralta RUEAV'WToday's Date: 05/26/2016     Maternal Data    Feeding    LATCH Score/Interventions                      Lactation Tools Discussed/Used     Consult Status      Maria [name], Maria [name] 05/26/2016, 2:26 PM

## 2016-05-26 NOTE — Progress Notes (Signed)
Subjective: Postpartum Day 1: Cesarean Delivery Patient reports tolerating PO, + flatus and no problems voiding.    Objective: Vital signs in last 24 hours: Temp:  [97.7 F (36.5 C)-97.8 F (36.6 C)] 97.7 F (36.5 C) (10/06 0525) Pulse Rate:  [79-85] 79 (10/06 0525) Resp:  [16-18] 16 (10/06 0525) BP: (93-97)/(41-61) 97/47 (10/06 0525) SpO2:  [97 %] 97 % (10/06 0030)  Physical Exam:  General: alert and no distress Lochia: appropriate Uterine Fundus: firm Incision: honeycomb dressing in place slightly stained DVT Evaluation: No evidence of DVT seen on physical exam.   Recent Labs  05/24/16 0155 05/26/16 0513  HGB 12.6 9.8*  HCT 36.2 28.7*    Assessment/Plan: Status post Cesarean section. Doing well postoperatively.  Continue current care. Breastfeeding   Murl Zogg Y 05/26/2016, 11:37 AM

## 2016-05-26 NOTE — Progress Notes (Signed)
CSW acknowledges consult.  CSW attempted to meet with MOB, however MOB had several room guest.  CSW will attempt to visit with MOB at a later time to complete clinical assessment.  Blaine HamperAngel Boyd-Gilyard, MSW, LCSW Clinical Social Work [phone]

## 2016-05-27 MED ORDER — IBUPROFEN 600 MG PO TABS: 600.0000 mg | ORAL_TABLET | Freq: Four times a day (QID) | ORAL | 0 refills | Status: DC

## 2016-05-27 MED ORDER — OXYCODONE HCL 5 MG PO TABS: 5.0000 mg | ORAL_TABLET | ORAL | 0 refills | Status: DC | PRN

## 2016-05-27 NOTE — Discharge Instructions (Signed)
Postpartum Care After Cesarean Delivery  After you deliver your newborn (postpartum period), the usual stay in the hospital is 24-72 hours. If there were problems with your labor or delivery, or if you have other medical problems, you might be in the hospital longer.   While you are in the hospital, you will receive help and instructions on how to care for yourself and your newborn during the postpartum period.   While you are in the hospital:   It is normal for you to have pain or discomfort from the incision in your abdomen. Be sure to tell your nurses when you are having pain, where the pain is located, and what makes the pain worse.   If you are breastfeeding, you may feel uncomfortable contractions of your uterus for a couple of weeks. This is normal. The contractions help your uterus get back to normal size.   It is normal to have some bleeding after delivery.   For the first 1-3 days after delivery, the flow is red and the amount may be similar to a period.   It is common for the flow to start and stop.   In the first few days, you may pass some small clots. Let your nurses know if you begin to pass large clots or your flow increases.   Do not  flush blood clots down the toilet before having the nurse look at them.   During the next 3-10 days after delivery, your flow should become more watery and pink or brown-tinged in color.   Ten to fourteen days after delivery, your flow should be a small amount of yellowish-white discharge.   The amount of your flow will decrease over the first few weeks after delivery. Your flow may stop in 6-8 weeks. Most women have had their flow stop by 12 weeks after delivery.   You should change your sanitary pads frequently.   Wash your hands thoroughly with soap and water for at least 20 seconds after changing pads, using the toilet, or before holding or feeding your newborn.   Your intravenous (IV) tubing will be removed when you are drinking enough fluids.   The  urine drainage tube (urinary catheter) that was inserted before delivery may be removed within 6-8 hours after delivery or when feeling returns to your legs. You should feel like you need to empty your bladder within the first 6-8 hours after the catheter has been removed.   In case you become weak, lightheaded, or faint, call your nurse before you get out of bed for the first time and before you take a shower for the first time.   Within the first few days after delivery, your breasts may begin to feel tender and full. This is called engorgement. Breast tenderness usually goes away within 48-72 hours after engorgement occurs. You may also notice milk leaking from your breasts. If you are not breastfeeding, do not stimulate your breasts. Breast stimulation can make your breasts produce more milk.   Spending as much time as possible with your newborn is very important. During this time, you and your newborn can feel close and get to know each other. Having your newborn stay in your room (rooming in) will help to strengthen the bond with your newborn. It will give you time to get to know your newborn and become comfortable caring for your newborn.   Your hormones change after delivery. Sometimes the hormone changes can temporarily cause you to feel sad or tearful. These   feelings should not last more than a few days. If these feelings last longer than that, you should talk to your caregiver.   If desired, talk to your caregiver about methods of family planning or contraception.   Talk to your caregiver about immunizations. Your caregiver may want you to have the following immunizations before leaving the hospital:   Tetanus, diphtheria, and pertussis (Tdap) or tetanus and diphtheria (Td) immunization. It is very important that you and your family (including grandparents) or others caring for your newborn are up-to-date with the Tdap or Td immunizations. The Tdap or Td immunization can help protect your newborn  from getting ill.   Rubella immunization.   Varicella (chickenpox) immunization.   Influenza immunization. You should receive this annual immunization if you did not receive the immunization during your pregnancy.     This information is not intended to replace advice given to you by your health care provider. Make sure you discuss any questions you have with your health care provider.     Document Released: 05/01/2012 Document Reviewed: 05/01/2012  Elsevier Interactive Patient Education 2016 Elsevier Inc[employer]    Postpartum Depression and Baby Blues  The postpartum period begins right after the birth of a baby. During this time, there is often a great amount of joy and excitement. It is also a time of many changes in the life of the parents. Regardless of how many times a mother gives birth, each child brings new challenges and dynamics to the family. It is not unusual to have feelings of excitement along with confusing shifts in moods, emotions, and thoughts. All mothers are at risk of developing postpartum depression or the "baby blues." These mood changes can occur right after giving birth, or they may occur many months after giving birth. The baby blues or postpartum depression can be mild or severe. Additionally, postpartum depression can go away rather quickly, or it can be a long-term condition.   CAUSES  Raised hormone levels and the rapid drop in those levels are thought to be a main cause of postpartum depression and the baby blues. A number of hormones change during and after pregnancy. Estrogen and progesterone usually decrease right after the delivery of your baby. The levels of thyroid hormone and various cortisol steroids also rapidly drop. Other factors that play a role in these mood changes include major life events and genetics.   RISK FACTORS  If you have any of the following risks for the baby blues or postpartum depression, know what symptoms to watch out for during the postpartum period. Risk  factors that may increase the likelihood of getting the baby blues or postpartum depression include:   Having a personal or family history of depression.    Having depression while being pregnant.    Having premenstrual mood issues or mood issues related to oral contraceptives.   Having a lot of life stress.    Having marital conflict.    Lacking a social support network.    Having a baby with special needs.    Having health problems, such as diabetes.   SIGNS AND SYMPTOMS  Symptoms of baby blues include:   Brief changes in mood, such as going from extreme happiness to sadness.   Decreased concentration.    Difficulty sleeping.    Crying spells, tearfulness.    Irritability.    Anxiety.   Symptoms of postpartum depression typically begin within the first month after giving birth. These symptoms include:   Difficulty sleeping or   excessive sleepiness.    Marked weight loss.    Agitation.    Feelings of worthlessness.    Lack of interest in activity or food.   Postpartum psychosis is a very serious condition and can be dangerous. Fortunately, it is rare. Displaying any of the following symptoms is cause for immediate medical attention. Symptoms of postpartum psychosis include:    Hallucinations and delusions.    Bizarre or disorganized behavior.    Confusion or disorientation.   DIAGNOSIS   A diagnosis is made by an evaluation of your symptoms. There are no medical or lab tests that lead to a diagnosis, but there are various questionnaires that a health care provider may use to identify those with the baby blues, postpartum depression, or psychosis. Often, a screening tool called the Edinburgh Postnatal Depression Scale is used to diagnose depression in the postpartum period.   TREATMENT  The baby blues usually goes away on its own in 1-2 weeks. Social support is often all that is needed. You will be encouraged to get adequate sleep and rest. Occasionally, you may be given  medicines to help you sleep.   Postpartum depression requires treatment because it can last several months or longer if it is not treated. Treatment may include individual or group therapy, medicine, or both to address any social, physiological, and psychological factors that may play a role in the depression. Regular exercise, a healthy diet, rest, and social support may also be strongly recommended.   Postpartum psychosis is more serious and needs treatment right away. Hospitalization is often needed.  HOME CARE INSTRUCTIONS   Get as much rest as you can. Nap when the baby sleeps.    Exercise regularly. Some women find yoga and walking to be beneficial.    Eat a balanced and nourishing diet.    Do little things that you enjoy. Have a cup of tea, take a bubble bath, read your favorite magazine, or listen to your favorite music.   Avoid alcohol.    Ask for help with household chores, cooking, grocery shopping, or running errands as needed. Do not try to do everything.    Talk to people close to you about how you are feeling. Get support from your partner, family members, friends, or other new moms.   Try to stay positive in how you think. Think about the things you are grateful for.    Do not spend a lot of time alone.    Only take over-the-counter or prescription medicine as directed by your health care provider.   Keep all your postpartum appointments.    Let your health care provider know if you have any concerns.   SEEK MEDICAL CARE IF:  You are having a reaction to or problems with your medicine.  SEEK IMMEDIATE MEDICAL CARE IF:   You have suicidal feelings.    You think you may harm the baby or someone else.  MAKE SURE YOU:   Understand these instructions.   Will watch your condition.   Will get help right away if you are not doing well or get worse.     This information is not intended to replace advice given to you by your health care provider. Make sure you discuss any questions  you have with your health care provider.     Document Released: 05/11/2004 Document Revised: 08/12/2013 Document Reviewed: 05/19/2013  Elsevier Interactive Patient Education 2016 Elsevier Inc.

## 2016-05-27 NOTE — Clinical Social Work Maternal (Signed)
  CLINICAL SOCIAL WORK MATERNAL/CHILD NOTE  Patient Details  Name: Maria [name] MRN: [id number] Date of Birth: October 28, 1990  Date:  05/27/2016  Clinical Social Worker Initiating Note:  Ferdinand Lango Aquilla Voiles, MSW, LCSW-A Date/ Time Initiated:  05/27/16/1243     Child's Name:  Maria [name]   Legal Guardian:  Mother   Need for Interpreter:  None   Date of Referral:  05/26/16     Reason for Referral:  Other (Comment) (Assess MOB emotional well-being )   Referral Source:  Physician   Address:  [address], [place] [address]  Phone number:  [phone]   Household Members:  Self   Natural Supports (not living in the home):  Parent, Immediate Family   Professional Supports: None   Employment: Unemployed   Type of Work:     Education:  9 to 11 years   Museum/gallery curator Resources:  [employer]   Other Resources:  [employer], Physicist, medical    Cultural/Religious Considerations Which May Impact Care:  None reported at this time.   Strengths:  Ability to meet basic needs , Pediatrician chosen , Home prepared for child , Compliance with medical plan    Risk Factors/Current Problems:  None   Cognitive State:  Goal Oriented , Insightful    Mood/Affect:  Calm , Interested    CSW Assessment: CSW met with MOB at bedside. This Probation officer explained role and reasoning for visit being to assess her emotional well-being/needs upon d/c. MOB confirms that she at times does feel overwhelmed because of her physical healing process after delivering. She notes she has not been able to use the bathroom as she would like. MOB notes that although feeling overwhelmed initially, she feels very calm today and endorses having  A great support from her mom who was present at the time of assessment. MOB notes her home is prepared and she has all basic needs. She declines any additional resources at this time. This Probation officer discussed PPD and SIDS. MOB verbalized understanding. At this time, no other needs were addressed or  requested. Case closed to this CSW.   CSW Plan/Description:  No Further Intervention Required/No Barriers to Discharge    Oda Cogan, MSW, [facility]  Office: [phone]

## 2016-05-27 NOTE — Discharge Summary (Signed)
Obstetric Discharge Summary Reason for Admission: rupture of membranes Prenatal Procedures: none Intrapartum Procedures: cesarean: low cervical, transverse arrest of descent Postpartum Procedures: none Complications-Operative and Postpartum: none  Cesarean Section Procedure Note  Indications: failure to progress: arrest of descent  Pre-operative Diagnosis: 40 week 6 day pregnancy.  Post-operative Diagnosis: same  Surgeon: Jessee AversOLE,TARA J.   Assistants: Sherre ScarletKimberly Williams CNM  Anesthesia: Epidural anesthesia and Spinal anesthesia  ASA Class: 2   Procedure Details   The patient was seen in the Holding Room. The risks, benefits, complications, treatment options, and expected outcomes were discussed with the patient.  The patient concurred with the proposed plan, giving informed consent.  The site of surgery properly noted/marked. The patient was taken to Operating Room # 9, identified as Maria BarrDanielle Cueva and the procedure verified as C-Section Delivery. A Time Out was held and the above information confirmed.  After induction of anesthesia, the patient was draped and prepped in the usual sterile manner. A Pfannenstiel incision was made and carried down through the subcutaneous tissue to the fascia. Fascial incision was made and extended transversely. The fascia was separated from the underlying rectus tissue superiorly and inferiorly. The peritoneum was identified and entered. Peritoneal incision was extended longitudinally. The utero-vesical peritoneal reflection was incised transversely and the bladder flap was bluntly freed from the lower uterine segment. A low transverse uterine incision was made. Delivered from cephalic presentation was a  Female with Apgar scores of 7 at one minute and 9 at five minutes. After the umbilical cord was clamped and cut cord blood was obtained for evaluation. The placenta was removed intact and appeared normal. The uterine outline, tubes and ovaries  appeared normal. The uterine incision was closed with running locked sutures of 0 vicryl. A second layer of 0 vicryl was used to imbricate the incision. . Hemostasis was observed. Lavage was carried out until clear.  Interseed was placed along the fascial incision. The fascia was then reapproximated with running sutures of 0 pds. The skin was reapproximated with 4-0 vicryl .  Instrument, sponge, and needle counts were correct prior the abdominal closure and at the conclusion of the case.   Findings: Female infant in the cephalic presentation. Normal fallopian tubes and ovaries.   Estimated Blood Loss:  600 mL         Drains: None         Total IV Fluids:  Per anesthesia ml         Specimens: Placenta and sent to labor and delivery            Implants: none         Complications:  None; patient tolerated the procedure well.         Disposition: PACU - hemodynamically stable.         Condition: stable  Hospital Course:  Principal Problem:   S/P primary low transverse C-section Active Problems:   Allergy to penicillin (rash)   Maria [name] is a 25 y.o. G1P1001 LTCS.  Patient was admitted 05/24/2016.  She has postpartum course that was uncomplicated including no problems with ambulating, PO intake, urination, pain, or bleeding. The pt feels ready to go home and  will be discharged with outpatient follow-up.   Today: No acute events overnight.  Pt denies problems with ambulating, voiding or po intake.  She denies nausea or vomiting.  Pain is well controlled.  She has had flatus. She has not had bowel movement.  Lochia Minimal.  Plan for birth  control is undecided.  Method of Feeding: breast  Physical Exam:  BP 118/64 (BP Location: Left Arm)   Pulse 91   Temp 97.9 F (36.6 C) (Oral)   Resp 18   Ht 5\' 1"  (1.549 m)   Wt 193 lb (87.5 kg)   LMP 08/20/2015   SpO2 97%   Breastfeeding? Unknown   BMI 36.47 kg/m  General: alert, cooperative and appears stated age 25:  appropriate Uterine Fundus: firm Incision: honeycomb dressing dry and intact DVT Evaluation: No evidence of DVT seen on physical exam.  H/H: Lab Results  Component Value Date/Time   HGB 9.8 (L) 05/26/2016 05:13 AM   HGB 13.1 10/08/2013 03:28 PM   HCT 28.7 (L) 05/26/2016 05:13 AM   HCT 38.4 10/08/2013 03:28 PM    Discharge Diagnoses: Term Pregnancy-delivered  Discharge Information: Date: 05/27/2016 Activity: pelvic rest Diet: routine  Medications:Percocet, Ibuprofen Breast feeding:  Yes Condition: stable Instructions: refer to practice specific booklet and refer to handout Discharge to: home     Follow-up Information    [facility]. Schedule an appointment as soon as possible for a visit in 1 week(s).   Specialty:  Obstetrics and Gynecology Why:  Incision check Contact information: [address] [place] [address] [phone]           Rhea Pink, CNM 05/27/2016,10:32 AM

## 2016-05-27 NOTE — Lactation Note (Addendum)
This note was copied from a baby's chart. Lactation Consultation Note  Mother and grandmother in room.  Mother is breastfeeding and supplementing after with formula. Occasionally using NS. Encouraged pumping but grandmother states she did with no results.  Provided education regarding pumping and volume results. Mother fell asleep during consult. Recommend grandmother have mother call if she needs assistance w/ latching.  Discussed prepumping before latching to help evert nipples.  Patient Name: Maria [name] UJWJX'BToday's Date: 05/27/2016 Reason for consult: Follow-up assessment   Maternal Data    Feeding Feeding Type: Breast Fed Length of feed: 10 min  LATCH Score/Interventions                      Lactation Tools Discussed/Used     Consult Status Consult Status: Follow-up Date: 05/28/16 Follow-up type: In-patient    Dahlia ByesBerkelhammer, Ruth [facility] 05/27/2016, 3:07 PM

## 2016-05-27 NOTE — Progress Notes (Signed)
CSW acknowledges consult. CSW attempted to meet with MOB, however MOB was breast feeding and requested no visitors.  CSW will attempt to visit with MOB at a later time to complete clinical assessment.  Rozalyn Osland, MSW, LCSW-A Clinical Social Worker  [facility]  Office: [phone]

## 2016-05-28 MED ORDER — TETANUS-DIPHTH-ACELL PERTUSSIS 5-2.5-18.5 LF-MCG/0.5 IM SUSP
0.5000 mL | Freq: Once | INTRAMUSCULAR | Status: AC
  Administered 2016-05-28: 0.5 mL via INTRAMUSCULAR

## 2016-05-28 MED ORDER — INFLUENZA VAC SPLIT QUAD 0.5 ML IM SUSY
0.5000 mL | PREFILLED_SYRINGE | Freq: Once | INTRAMUSCULAR | Status: AC
  Administered 2016-05-28: 0.5 mL via INTRAMUSCULAR

## 2016-05-28 NOTE — Discharge Summary (Signed)
Obstetric Discharge Summary Reason for Admission: rupture of membranes Prenatal Procedures: ultrasound Intrapartum Procedures: cesarean: low cervical, transverse Postpartum Procedures: none Complications-Operative and Postpartum: none Hemoglobin  Date Value Ref Range Status  05/26/2016 9.8 (L) 12.0 - 15.0 g/dL Final   HGB  Date Value Ref Range Status  10/08/2013 13.1 12.0 - 16.0 g/dL Final   HCT  Date Value Ref Range Status  05/26/2016 28.7 (L) 36.0 - 46.0 % Final  10/08/2013 38.4 35.0 - 47.0 % Final    Physical Exam:  General: alert, cooperative and no distress Lochia: appropriate Uterine Fundus: firm Incision: Honeycomb dressing for 5 to 7 days.  No signs of bleeding or infection DVT Evaluation: No evidence of DVT seen on physical exam.  Discharge Diagnoses: Term Pregnancy-delivered  Discharge Information: Date: 05/28/2016 Activity: unrestricted Diet: routine Medications: Ibuprofen, percocet Condition: stable Instructions: refer to practice specific booklet Discharge to: home Follow-up Information    [facility]. Schedule an appointment as soon as possible for a visit in 1 week(s).   Specialty:  Obstetrics and Gynecology Why:  Incision check Contact information: [address] [place] [phone]          Newborn Data: Live born female  Birth Weight: 8 lb 15.2 oz (4060 g) APGAR: 7, 9  Home, infant pt to feed and grow  Henderson NewcomerNancy Jean Prothero 05/28/2016, 8:16 AM

## 2016-05-28 NOTE — Lactation Note (Signed)
This note was copied from a baby's chart. Lactation Consultation Note  Prepumped with manual pump to help evert small nipples.  Provided mother w/ #21 flanges. Applied #16NS.  Encouraged mother to hand express drops in NS. Baby latched with assistance in football hold. Intermittent sucks and swallows observed.  Provided mother with another #16NS. Recommend she post pump 4-5 times a day for 10-15 min and give volume back to baby at next feeding. Discussed milk storage. [facility] She has manual pump. Offered pump loaner or rental and OP appt and mother and grandmother declined. Reviewed engorgement care and monitoring voids/stools.   Patient Name: Boy Karle BarrDanielle Espana WGNFA'OToday's Date: 05/28/2016 Reason for consult: Follow-up assessment   Maternal Data    Feeding Feeding Type: Breast Fed Length of feed: 10 min  LATCH Score/Interventions Latch: Repeated attempts needed to sustain latch, nipple held in mouth throughout feeding, stimulation needed to elicit sucking reflex.  Audible Swallowing: Spontaneous and intermittent  Type of Nipple: Everted at rest and after stimulation  Comfort (Breast/Nipple): Filling, red/small blisters or bruises, mild/mod discomfort  Problem noted: Mild/Moderate discomfort;Filling Interventions (Filling): Frequent nursing;Massage;Double electric pump;Hand pump Interventions (Mild/moderate discomfort): Hand expression;Pre-pump if needed;Breast shields  Hold (Positioning): Assistance needed to correctly position infant at breast and maintain latch.  LATCH Score: 7  Lactation Tools Discussed/Used Tools: Nipple Shields Nipple shield size: 16   Consult Status Consult Status: Complete    Hardie PulleyBerkelhammer, Ruth Boschen 05/28/2016, 12:03 PM

## 2016-06-09 DIAGNOSIS — D75839 Thrombocytosis, unspecified: Secondary | ICD-10-CM

## 2016-06-09 HISTORY — DX: Thrombocytosis, unspecified: D75.839

## 2016-12-25 ENCOUNTER — Encounter: Payer: Self-pay | Admitting: Emergency Medicine

## 2016-12-25 DIAGNOSIS — J45909 Unspecified asthma, uncomplicated: Secondary | ICD-10-CM | POA: Diagnosis not present

## 2016-12-25 DIAGNOSIS — K529 Noninfective gastroenteritis and colitis, unspecified: Secondary | ICD-10-CM | POA: Insufficient documentation

## 2016-12-25 DIAGNOSIS — Z87891 Personal history of nicotine dependence: Secondary | ICD-10-CM | POA: Insufficient documentation

## 2016-12-25 DIAGNOSIS — R109 Unspecified abdominal pain: Secondary | ICD-10-CM | POA: Diagnosis present

## 2016-12-25 LAB — URINALYSIS, COMPLETE (UACMP) WITH MICROSCOPIC
BILIRUBIN URINE: NEGATIVE
GLUCOSE, UA: NEGATIVE mg/dL
HGB URINE DIPSTICK: NEGATIVE
Ketones, ur: NEGATIVE mg/dL
LEUKOCYTES UA: NEGATIVE
NITRITE: NEGATIVE
PH: 6 (ref 5.0–8.0)
Protein, ur: NEGATIVE mg/dL
SPECIFIC GRAVITY, URINE: 1.026 (ref 1.005–1.030)

## 2016-12-25 LAB — CBC
HEMATOCRIT: 41.1 % (ref 35.0–47.0)
Hemoglobin: 14.1 g/dL (ref 12.0–16.0)
MCH: 31.9 pg (ref 26.0–34.0)
MCHC: 34.3 g/dL (ref 32.0–36.0)
MCV: 93.2 fL (ref 80.0–100.0)
PLATELETS: 221 10*3/uL (ref 150–440)
RBC: 4.41 MIL/uL (ref 3.80–5.20)
RDW: 13.4 % (ref 11.5–14.5)
WBC: 9.1 10*3/uL (ref 3.6–11.0)

## 2016-12-25 LAB — POCT PREGNANCY, URINE: PREG TEST UR: NEGATIVE

## 2016-12-25 NOTE — ED Triage Notes (Signed)
Pt in with co left sided abd pain that radiates to lower back, worse when she walks and sits. States has hx of pancreatitis and ovarian cyst.

## 2016-12-26 ENCOUNTER — Emergency Department: Payer: Medicaid Other

## 2016-12-26 ENCOUNTER — Emergency Department
Admission: EM | Admit: 2016-12-26 | Discharge: 2016-12-26 | Disposition: A | Discharge status: Home / Self Care | Payer: Medicaid Other | Attending: Emergency Medicine | Admitting: Emergency Medicine

## 2016-12-26 ENCOUNTER — Encounter: Payer: Self-pay | Admitting: Emergency Medicine

## 2016-12-26 DIAGNOSIS — K529 Noninfective gastroenteritis and colitis, unspecified: Secondary | ICD-10-CM

## 2016-12-26 LAB — COMPREHENSIVE METABOLIC PANEL
ALT: 51 U/L (ref 14–54)
AST: 38 U/L (ref 15–41)
Albumin: 4.5 g/dL (ref 3.5–5.0)
Alkaline Phosphatase: 94 U/L (ref 38–126)
Anion gap: 8 (ref 5–15)
BILIRUBIN TOTAL: 0.7 mg/dL (ref 0.3–1.2)
BUN: 11 mg/dL (ref 6–20)
CHLORIDE: 105 mmol/L (ref 101–111)
CO2: 27 mmol/L (ref 22–32)
Calcium: 9.2 mg/dL (ref 8.9–10.3)
Creatinine, Ser: 0.73 mg/dL (ref 0.44–1.00)
Glucose, Bld: 102 mg/dL — ABNORMAL HIGH (ref 65–99)
Potassium: 3.8 mmol/L (ref 3.5–5.1)
Sodium: 140 mmol/L (ref 135–145)
TOTAL PROTEIN: 7.5 g/dL (ref 6.5–8.1)

## 2016-12-26 LAB — LIPASE, BLOOD: LIPASE: 28 U/L (ref 11–51)

## 2016-12-26 MED ORDER — IOPAMIDOL (ISOVUE-300) INJECTION 61%: 30.0000 mL | Freq: Once | INTRAVENOUS | Status: DC

## 2016-12-26 MED ORDER — ONDANSETRON HCL 4 MG/2ML IJ SOLN
4.0000 mg | Freq: Once | INTRAMUSCULAR | Status: AC
  Administered 2016-12-26: 4 mg via INTRAVENOUS
  Filled 2016-12-26: qty 2

## 2016-12-26 MED ORDER — OXYCODONE-ACETAMINOPHEN 5-325 MG PO TABS: 1.0000 | ORAL_TABLET | ORAL | 0 refills | Status: DC | PRN

## 2016-12-26 MED ORDER — IOPAMIDOL (ISOVUE-300) INJECTION 61%
100.0000 mL | Freq: Once | INTRAVENOUS | Status: AC | PRN
  Administered 2016-12-26: 100 mL via INTRAVENOUS

## 2016-12-26 MED ORDER — MORPHINE SULFATE (PF) 2 MG/ML IV SOLN
2.0000 mg | Freq: Once | INTRAVENOUS | Status: AC
  Administered 2016-12-26: 2 mg via INTRAVENOUS
  Filled 2016-12-26: qty 1

## 2016-12-26 NOTE — ED Notes (Signed)
Pt went to CT

## 2016-12-26 NOTE — ED Provider Notes (Signed)
[facility] Emergency Department Provider Note   First MD Initiated Contact with Patient 12/26/16 [phone]     (approximate)  I have reviewed the triage vital signs and the nursing notes.   HISTORY  Chief Complaint Abdominal Pain   HPI Maria [name] is a 26 y.o. female with below list of chronic medical conditions presents to the emergency department with 10 out of 10 sharp "left side abdominal pain which radiates to her back. Patient states that the pain is worse with walking and sitting. Patient denies any nausea no vomiting or diarrhea. Patient denies any urinary symptoms. Patient denies any fever or febrile on presentation.   Past Medical History:  Diagnosis Date  . Acute pancreatitis   . Asthma   . GERD (gastroesophageal reflux disease)   . Hemorrhoid   . Hx of varicella   . Ovarian cyst   . Recurrent boils    legs and buttocks.  staff infection, was neg  . UTI (urinary tract infection)     Patient Active Problem List   Diagnosis Date Noted  . S/P primary low transverse C-section 05/25/2016  . Allergy to penicillin (rash) 05/24/2016    Past Surgical History:  Procedure Laterality Date  . CESAREAN SECTION N/A 05/25/2016   Procedure: CESAREAN SECTION;  Surgeon: Gerald Leitz, MD;  Location: [facility] BIRTHING SUITES;  Service: Obstetrics;  Laterality: N/A;  . TONSILLECTOMY    . WISDOM TOOTH EXTRACTION      Prior to Admission medications   Medication Sig Start Date End Date Taking? Authorizing Provider  albuterol (PROVENTIL HFA;VENTOLIN HFA) 108 (90 BASE) MCG/ACT inhaler Inhale 2 puffs into the lungs every 6 (six) hours as needed for wheezing or shortness of breath.    [provider]  ibuprofen (ADVIL,MOTRIN) 600 MG tablet Take 1 tablet (600 mg total) by mouth every 6 (six) hours. Patient not taking: Reported on 12/26/2016 05/27/16   Clemmons, Lawson Fiscal A, CNM  oxyCODONE (OXY IR/ROXICODONE) 5 MG immediate release tablet Take 1 tablet (5 mg total) by  mouth every 4 (four) hours as needed (pain scale 4-7). Patient not taking: Reported on 12/26/2016 05/27/16   Clemmons, Elmore Guise, CNM    Allergies Penicillins  Family History  Problem Relation Age of Onset  . Hypertension Maternal Grandmother   . Heart disease Maternal Grandfather   . Stroke Maternal Grandfather   . Diabetes Neg Hx   . Cancer Neg Hx     Social History Social History  Substance Use Topics  . Smoking status: Former Smoker    Packs/day: 0.50    Types: Cigarettes    Quit date: 11/07/2013  . Smokeless tobacco: Former Neurosurgeon  . Alcohol use 0.6 oz/week    1 Cans of beer per week     Comment: occasional not while preg    Review of Systems Constitutional: No fever/chills Eyes: No visual changes. ENT: No sore throat. Cardiovascular: Denies chest pain. Respiratory: Denies shortness of breath. Gastrointestinal: Positive for abdominal pain. No nausea, no vomiting.  No diarrhea.  No constipation. Genitourinary: Negative for dysuria. Musculoskeletal: Negative for back pain. Integumentary: Negative for rash. Neurological: Negative for headaches, focal weakness or numbness.   ____________________________________________   PHYSICAL EXAM:  VITAL SIGNS: ED Triage Vitals  Enc Vitals Group     BP 12/25/16 2313 140/81     Pulse Rate 12/25/16 2313 92     Resp 12/25/16 2313 18     Temp 12/25/16 2313 97.7 F (36.5 C)  Temp Source 12/25/16 2313 Oral     SpO2 12/25/16 2313 100 %     Weight 12/25/16 2312 150 lb (68 kg)     Height 12/25/16 2312 5\' 2"  (1.575 m)     Head Circumference --      Peak Flow --      Pain Score 12/25/16 2311 10     Pain Loc --      Pain Edu? --      Excl. in GC? --     Constitutional: Alert and oriented. Well appearing and in no acute distress. Eyes: Conjunctivae are normal. PERRL. EOMI. Head: Atraumatic. Mouth/Throat: Mucous membranes are moist. Oropharynx non-erythematous. Neck: No stridor.   Cardiovascular: Normal rate, regular rhythm.  Good peripheral circulation. Grossly normal heart sounds. Respiratory: Normal respiratory effort.  No retractions. Lungs CTAB. Gastrointestinal: Right lower quadrant/left lower quadrant tenderness to palpation. No distention.  Musculoskeletal: No lower extremity tenderness nor edema. No gross deformities of extremities. Neurologic:  Normal speech and language. No gross focal neurologic deficits are appreciated.  Skin:  Skin is warm, dry and intact. No rash noted. Psychiatric: Mood and affect are normal. Speech and behavior are normal.  ____________________________________________   LABS (all labs ordered are listed, but only abnormal results are displayed)  Labs Reviewed  COMPREHENSIVE METABOLIC PANEL - Abnormal; Notable for the following:       Result Value   Glucose, Bld 102 (*)    All other components within normal limits  URINALYSIS, COMPLETE (UACMP) WITH MICROSCOPIC - Abnormal; Notable for the following:    Color, Urine YELLOW (*)    APPearance CLEAR (*)    Bacteria, UA RARE (*)    Squamous Epithelial / LPF 0-5 (*)    All other components within normal limits  CBC  LIPASE, BLOOD  POC URINE PREG, ED  POCT PREGNANCY, URINE    RADIOLOGY I, Page N BROWN, personally viewed and evaluated these images (plain radiographs) as part of my medical decision making, as well as reviewing the written report by the radiologist.  Ct Abdomen Pelvis W Contrast  Result Date: 12/26/2016 CLINICAL DATA:  Left-sided abdominal pain radiating to the low back, worse with walking and sitting. Previous history of pancreatitis and ovarian cysts. EXAM: CT ABDOMEN AND PELVIS WITH CONTRAST TECHNIQUE: Multidetector CT imaging of the abdomen and pelvis was performed using the standard protocol following bolus administration of intravenous contrast. CONTRAST:  100mL ISOVUE-300 IOPAMIDOL (ISOVUE-300) INJECTION 61% COMPARISON:  None. FINDINGS: Lower chest: Mild dependent changes in the lung bases. Hepatobiliary:  No focal liver abnormality is seen. No gallstones, gallbladder wall thickening, or biliary dilatation. Pancreas: Unremarkable. No pancreatic ductal dilatation or surrounding inflammatory changes. Spleen: Normal in size without focal abnormality. Adrenals/Urinary Tract: Adrenal glands are unremarkable. Kidneys are normal, without renal calculi, focal lesion, or hydronephrosis. Bladder is unremarkable. Stomach/Bowel: Stomach is unremarkable. No evidence of bowel distention. Focal wall thickening demonstrated in the proximal jejunum without proximal obstruction. This could represent a focal enteritis, inflammatory process such as Crohn disease, or possibly an infiltrating neoplasm such as lymphoma. No other small bowel abnormalities are demonstrated. Colon is stool filled without distention or wall thickening. Scattered diverticula in the colon. Appendix is normal. Vascular/Lymphatic: No significant vascular findings are present. No enlarged abdominal or pelvic lymph nodes. Reproductive: Uterus and bilateral adnexa are unremarkable. Other: Scarring in the anterior pelvic wall, likely postoperative. No free air or free fluid in the abdomen. 1.1 x 1.8 cm diameter circumscribed low-attenuation lesion in the right anterior  chest wall at the costochondral junction of the right seventh rib. Etiology is indeterminate. Musculoskeletal: No destructive bone lesions. IMPRESSION: 1. Short-segment abnormal small bowel loop at the proximal jejunum just distal to the ligament of Treitz. Focal wall thickening is demonstrated. This could represent focal enteritis, inflammatory process such as Crohn disease, or possibly infiltrating neoplasm such as lymphoma. No evidence of bowel obstruction. 2. Small focal low-attenuation lesion in the right anterior chest wall at the costochondral junction of the right seventh rib of nonspecific etiology. Electronically Signed   By: Burman Nieves M.D.   On: 12/26/2016 03:40     ___  Procedures   ____________________________________________   INITIAL IMPRESSION / ASSESSMENT AND PLAN / ED COURSE  Pertinent labs & imaging results that were available during my care of the patient were reviewed by me and considered in my medical decision making (see chart for details).  26 year old female presenting with abdominal pain was 10 out of 10 on arrival. Patient given IV morphine and Zofran with improvement of pain. CT scan findings discussed with the patient in addition to the concern for possible Crohn's disease. Patient is referred to gastroenterologist for further outpatient evaluation and management.      ____________________________________________  FINAL CLINICAL IMPRESSION(S) / ED DIAGNOSES  Final diagnoses:  Enteritis     MEDICATIONS GIVEN DURING THIS VISIT:  Medications  morphine 2 MG/ML injection 2 mg (2 mg Intravenous Given 12/26/16 0312)  ondansetron (ZOFRAN) injection 4 mg (4 mg Intravenous Given 12/26/16 0312)  iopamidol (ISOVUE-300) 61 % injection 100 mL (100 mLs Intravenous Contrast Given 12/26/16 0320)     NEW OUTPATIENT MEDICATIONS STARTED DURING THIS VISIT:  New Prescriptions   No medications on file    Modified Medications   No medications on file    Discontinued Medications   PRENAT W/O A-FECBGL-DSS-FA-DHA (CITRANATAL 90 DHA) 90-1 & 300 MG MISC    Take 1 tablet by mouth daily with lunch.     Note:  This document was prepared using Dragon voice recognition software and may include unintentional dictation errors.    Darci Current, MD 12/28/16 [phone]

## 2016-12-26 NOTE — ED Notes (Signed)
[name] RN helped the Pt ambulate to the bedside commode. Pt returned to her bed without difficulty.  

## 2017-01-01 IMAGING — US US MFM FETAL BPP W/O NON-STRESS
1 series · 15 of 28 positions shown · non-contrast
Comparison: none

[Series 1: us mfm fetal bpp w/o non-stress · 34 acquisitions, 15 frames shown]
[im 1/34]
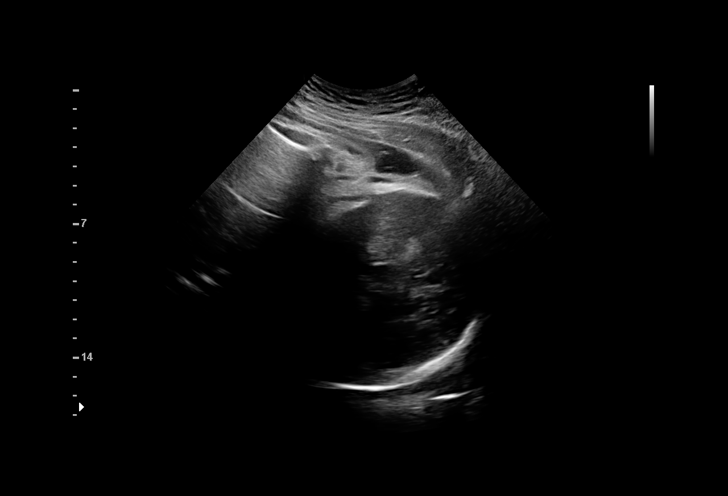
[im 3/34]
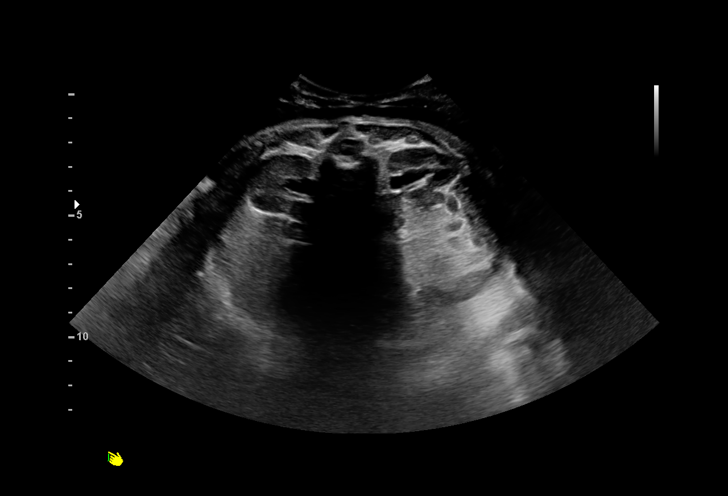
[im 5/34]
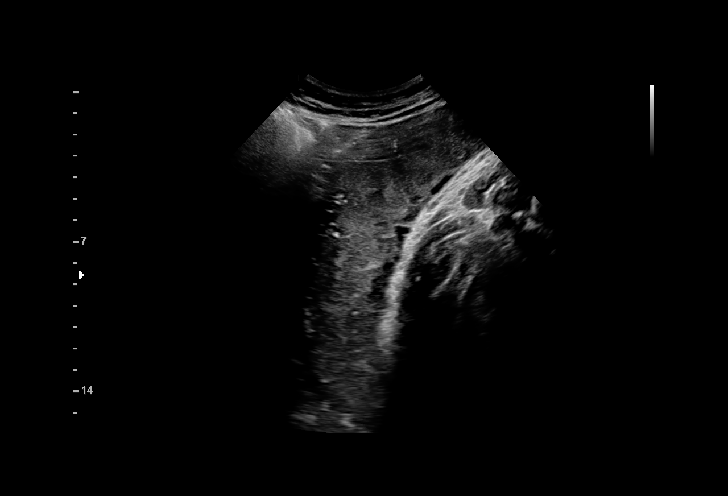
[im 8/34]
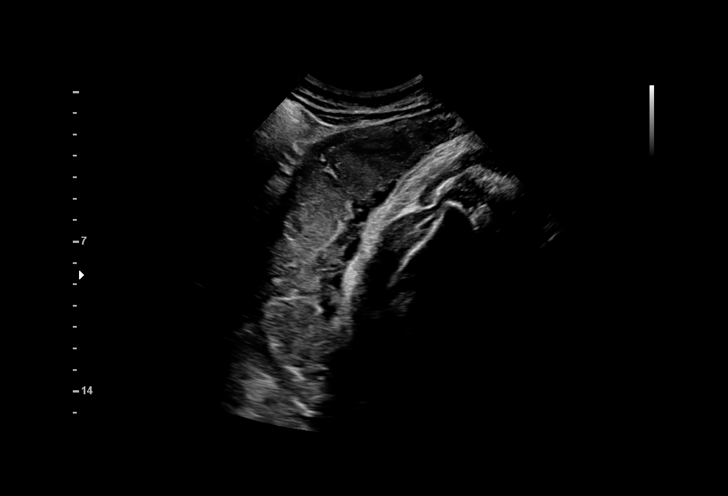
[im 10/34]
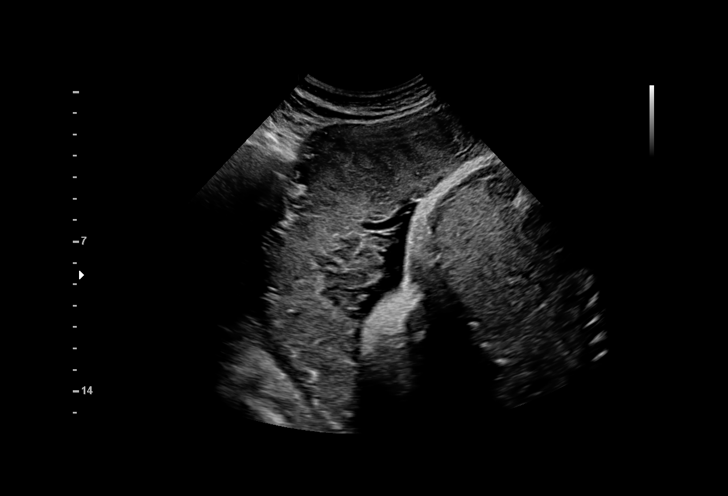
[im 13/34]
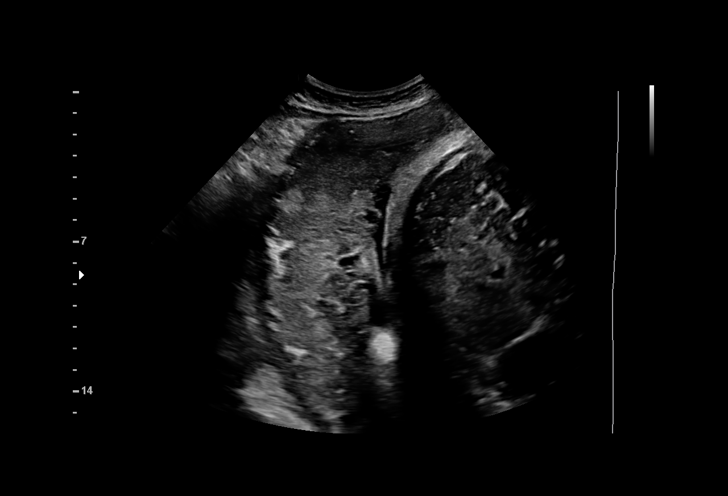
[im 15/34]
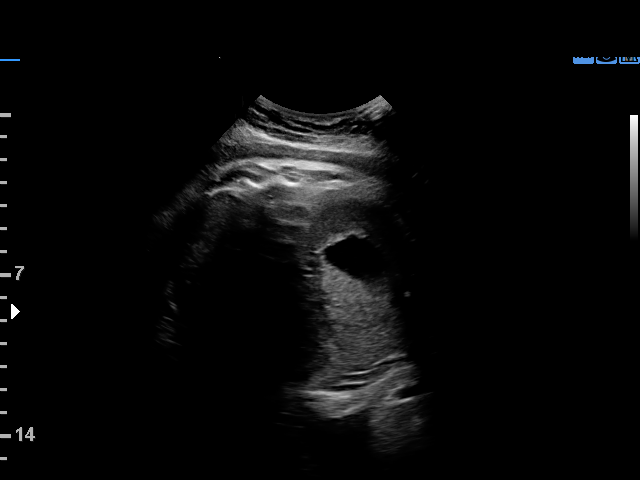
[im 18/34]
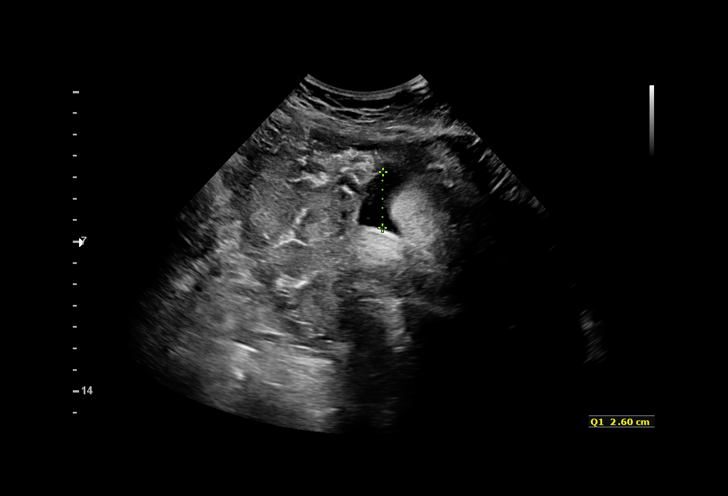
[im 19/34]
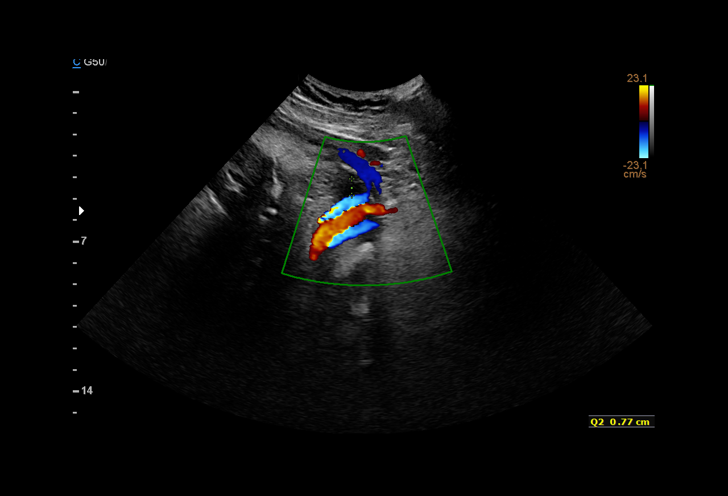
[im 21/34]
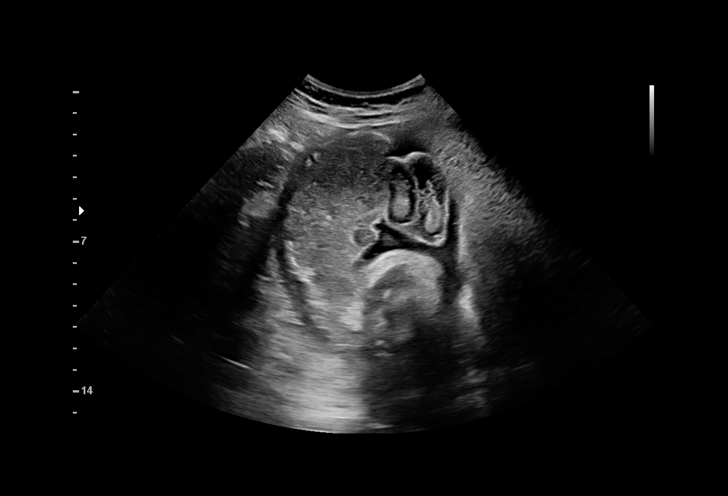
[im 24/34]
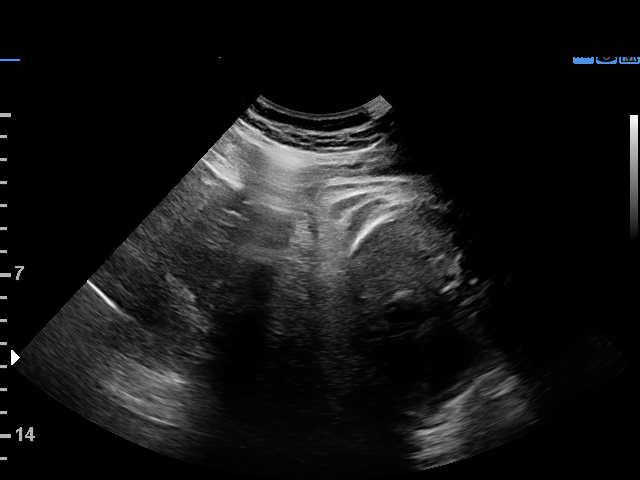
[im 26/34]
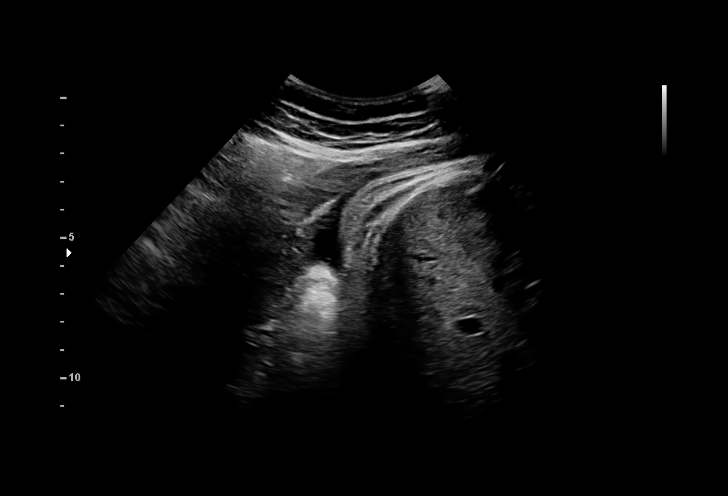
[im 29/34]
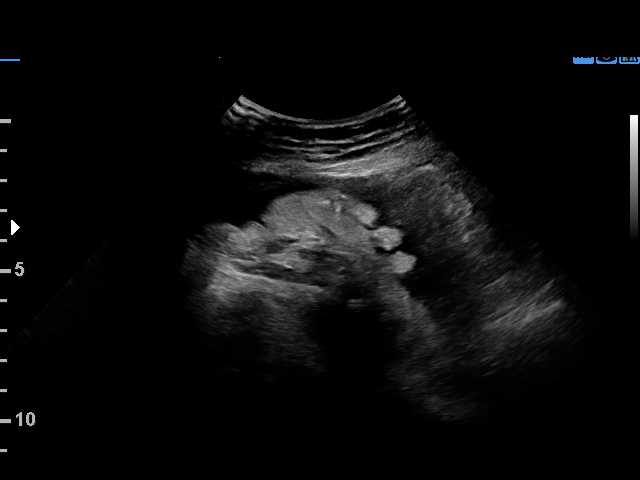
[im 31/34]
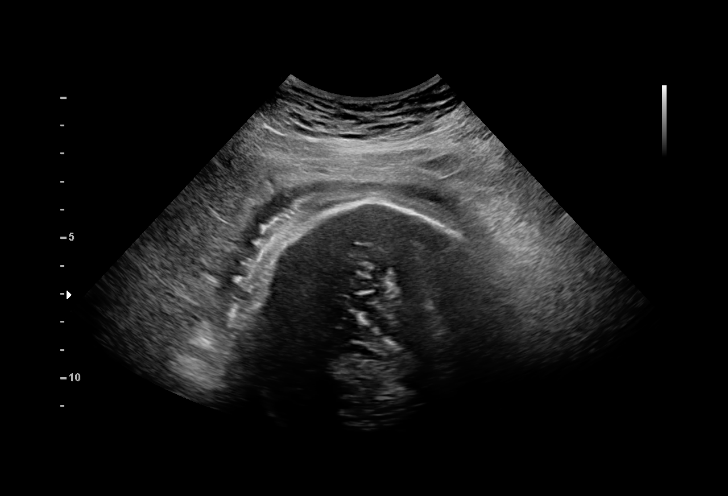
[im 34/34]
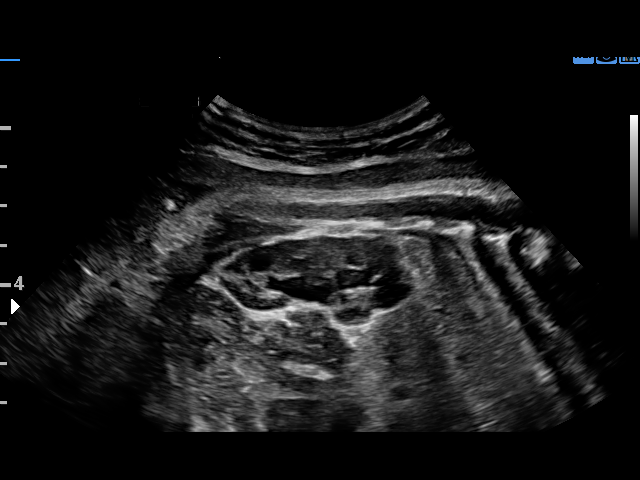

[15 of 28 positions shown; findings below may reference images not displayed]

Obstetrics &
Gynecology
[id number]
[place]

1  SPIRO DIEP           [id number]      [id number]     [id number]
Indications

40 weeks gestation of pregnancy
Decreased fetal movements, third trimester,
unspecified
OB History

Gravidity:    1         Term:   0
Fetal Evaluation

Num Of Fetuses:     1
Fetal Heart         140
Rate(bpm):
Cardiac Activity:   Observed
Presentation:       Cephalic
Placenta:           Rt lat and fundal, above cervical os
P. Cord Insertion:  Not well visualized

Amniotic Fluid
AFI FV:      Subjectively within normal limits

AFI Sum(cm)     %Tile       Largest Pocket(cm)
10.69           38
RUQ(cm)       RLQ(cm)       LUQ(cm)        LLQ(cm)
2.6
Biophysical Evaluation

Amniotic F.V:   Pocket => 2 cm two         F. Tone:        Observed
planes
F. Movement:    Observed                   Score:          [DATE]
F. Breathing:   Observed
Gestational Age

Clinical EDD:  40w 3d                                        EDD:   05/19/16
Best:          40w 3d    Det. By:   Clinical EDD             EDD:   05/19/16
Cervix Uterus Adnexa

Cervix
Not visualized (advanced GA >59wks)
Impression

SIUP at 40+3 weeks
Cepahlic presentation
Normal amniotic fluid volume
BPP [DATE]
Recommendations

Follow-up as clinically indicated

## 2017-01-03 ENCOUNTER — Ambulatory Visit: Payer: Medicaid Other | Admitting: Gastroenterology

## 2017-01-03 ENCOUNTER — Encounter: Payer: Self-pay | Admitting: Gastroenterology

## 2018-08-30 ENCOUNTER — Other Ambulatory Visit: Payer: Self-pay | Admitting: Obstetrics and Gynecology

## 2018-08-30 DIAGNOSIS — N643 Galactorrhea not associated with childbirth: Secondary | ICD-10-CM

## 2018-09-03 ENCOUNTER — Other Ambulatory Visit: Payer: Self-pay

## 2018-09-09 ENCOUNTER — Ambulatory Visit
Admission: RE | Admit: 2018-09-09 | Discharge: 2018-09-09 | Disposition: A | Discharge status: Home / Self Care | Payer: Medicaid Other | Source: Ambulatory Visit | Attending: Obstetrics and Gynecology | Admitting: Obstetrics and Gynecology

## 2018-09-09 DIAGNOSIS — N643 Galactorrhea not associated with childbirth: Secondary | ICD-10-CM

## 2018-10-30 DIAGNOSIS — G8929 Other chronic pain: Secondary | ICD-10-CM | POA: Insufficient documentation

## 2018-10-30 DIAGNOSIS — M4300 Spondylolysis, site unspecified: Secondary | ICD-10-CM | POA: Insufficient documentation

## 2019-08-11 DIAGNOSIS — Z713 Dietary counseling and surveillance: Secondary | ICD-10-CM | POA: Insufficient documentation

## 2019-11-12 ENCOUNTER — Ambulatory Visit (INDEPENDENT_AMBULATORY_CARE_PROVIDER_SITE_OTHER): Payer: Medicaid Other | Admitting: Urology

## 2019-11-12 ENCOUNTER — Other Ambulatory Visit: Payer: Self-pay

## 2019-11-12 ENCOUNTER — Encounter: Payer: Self-pay | Admitting: Urology

## 2019-11-12 VITALS — BP 127/77 | HR 106 | Ht 62.0 in | Wt 163.2 lb

## 2019-11-12 DIAGNOSIS — K219 Gastro-esophageal reflux disease without esophagitis: Secondary | ICD-10-CM | POA: Insufficient documentation

## 2019-11-12 DIAGNOSIS — T402X5A Adverse effect of other opioids, initial encounter: Secondary | ICD-10-CM | POA: Insufficient documentation

## 2019-11-12 DIAGNOSIS — R300 Dysuria: Secondary | ICD-10-CM | POA: Diagnosis not present

## 2019-11-12 DIAGNOSIS — R102 Pelvic and perineal pain: Secondary | ICD-10-CM

## 2019-11-12 DIAGNOSIS — K5903 Drug induced constipation: Secondary | ICD-10-CM | POA: Insufficient documentation

## 2019-11-12 DIAGNOSIS — M48061 Spinal stenosis, lumbar region without neurogenic claudication: Secondary | ICD-10-CM | POA: Insufficient documentation

## 2019-11-12 MED ORDER — DIAZEPAM 5 MG PO TABS: 5.0000 mg | ORAL_TABLET | Freq: Every day | ORAL | 0 refills | Status: AC

## 2019-11-12 NOTE — Progress Notes (Signed)
11/12/19 3:41 PM   Maria [name] 1990-12-04 [id number]  CC: Pelvic pain  HPI: I saw Ms. Fahringer in urology clinic today for evaluation of pelvic pain, dyspareunia, and dysuria.  She is a 29 year old female who reports chronic dyspareunia since high school, chronic pelvic/bladder pain, and " recurrent UTIs."  There are no prior positive urine cultures in the chart that I am able to see.  She reports her primary complaint is lower abdominal and pelvic pain after sexual activity, as well as intermittent dysuria.  She is unable to identify any other alleviating or aggravating factors.  She denies any history of hematuria or flank pain.  She has had trouble with constipation in the past.  She is also recently had a back surgery.  She takes a number of medications including amitriptyline, gabapentin, oxycodone, and meloxicam.  She denies any incontinence or urinary dribbling.  She denies any urinary complaints overnight.  She is unable to associate any foods or activities with her pain aside from sex.  Only prior recent imaging is a transvaginal ultrasound from earlier this month that was benign.  Urinalysis is benign today with 0-5 WBCs, 0 RBCs, no bacteria, nitrite negative, no leukocytes   PMH: Past Medical History:  Diagnosis Date  . Acute pancreatitis   . Asthma   . GERD (gastroesophageal reflux disease)   . Hemorrhoid   . Hx of varicella   . Ovarian cyst   . Recurrent boils    legs and buttocks.  staff infection, was neg  . UTI (urinary tract infection)     Surgical History: Past Surgical History:  Procedure Laterality Date  . CESAREAN SECTION N/A 05/25/2016   Procedure: CESAREAN SECTION;  Surgeon: Gerald Leitz, MD;  Location: [facility];  Service: Obstetrics;  Laterality: N/A;  . TONSILLECTOMY    . WISDOM TOOTH EXTRACTION      Family History: Family History  Problem Relation Age of Onset  . Hypertension Maternal Grandmother   . Heart disease Maternal Grandfather    . Stroke Maternal Grandfather   . Diabetes Neg Hx   . Cancer Neg Hx     Social History:  reports that she quit smoking about 6 years ago. Her smoking use included cigarettes. She smoked 0.50 packs per day. She has quit using smokeless tobacco. She reports current alcohol use of about 1.0 standard drinks of alcohol per week. She reports that she does not use drugs.  Physical Exam: BP 127/77 (BP Location: Left Arm, Patient Position: Sitting, Cuff Size: Normal)   Pulse (!) 106   Ht 5\' 2"  (1.575 m)   Wt 163 lb 3.2 oz (74 kg)   BMI 29.85 kg/m    Constitutional:  Alert and oriented, No acute distress. Cardiovascular: No clubbing, cyanosis, or edema. Respiratory: Normal respiratory effort, no increased work of breathing. GU: Pelvic exam performed with [facility] as chaperone.  Normal-appearing external genitalia, meatus patent without any obvious abnormalities.  Urethra non-tender.  Lower abdominal tenderness.  Laboratory Data: No prior positive urine cultures Urinalysis today benign with 0-5 WBCs, 0 RBCs, no bacteria, nitrite negative  Pertinent Imaging: Transvaginal ultrasound 10/30/2019 benign  Assessment & Plan:   In summary, she is a 29 year old female with chronic low pelvic pain, dyspareunia, and occasional dysuria.  She has no prior positive urine cultures.  We discussed the complexities of pelvic pain and possible range of etiologies including pelvic floor dysfunction, chronic bladder pain syndrome, and interstitial cystitis.  We reviewed the AUA guidelines that recommend  an algorithmic approach to treatment for these patients.  We discussed that a trial and error approach is sometimes needed to find treatment strategies that worked best for each patient's unique situation.  I reinforced the importance of stress management, relaxation, and pain management and the approach to pelvic pain.  -Trial of vaginal Valium suppositories nightly x10 days -Referral placed to pelvic floor physical  therapy -RTC 8 weeks for symptom check.  If persistent symptoms at that time consider cystoscopy and cimetidine  I spent 45 total minutes on the day of the encounter including pre-visit review of the medical record, face-to-face time with the patient, and post visit ordering of labs/imaging/tests.  Nickolas Madrid, MD 11/12/2019  [facility] [address] [place], [place] [address] [phone]

## 2019-11-12 NOTE — Patient Instructions (Signed)
Pelvic Pain, Female Pelvic pain is pain in your lower abdomen, below your belly button and between your hips. The pain may start suddenly (be acute), keep coming back (be recurring), or last a long time (become chronic). Pelvic pain that lasts longer than 6 months is considered chronic. Pelvic pain may affect your:  Reproductive organs.  Urinary system.  Digestive tract.  Musculoskeletal system. There are many potential causes of pelvic pain. Sometimes, the pain can be a result of digestive or urinary conditions, strained muscles or ligaments, or reproductive conditions. Sometimes the cause of pelvic pain is not known. Follow these instructions at home:   Take over-the-counter and prescription medicines only as told by your health care provider.  Rest as told by your health care provider.  Do not have sex if it hurts.  Keep a journal of your pelvic pain. Write down: ? When the pain started. ? Where the pain is located. ? What seems to make the pain better or worse, such as food or your period (menstrual cycle). ? Any symptoms you have along with the pain.  Keep all follow-up visits as told by your health care provider. This is important. Contact a health care provider if:  Medicine does not help your pain.  Your pain comes back.  You have new symptoms.  You have abnormal vaginal discharge or bleeding, including bleeding after menopause.  You have a fever or chills.  You are constipated.  You have blood in your urine or stool.  You have foul-smelling urine.  You feel weak or light-headed. Get help right away if:  You have sudden severe pain.  Your pain gets steadily worse.  You have severe pain along with fever, nausea, vomiting, or excessive sweating.  You lose consciousness. Summary  Pelvic pain is pain in your lower abdomen, below your belly button and between your hips.  There are many potential causes of pelvic pain.  Keep a journal of your pelvic  pain. This information is not intended to replace advice given to you by your health care provider. Make sure you discuss any questions you have with your health care provider. Document Revised: 01/23/2018 Document Reviewed: 01/23/2018 Elsevier Patient Education  2020 [employer] Inc.   Interstitial Cystitis  Interstitial cystitis is inflammation of the bladder. This may cause pain in the bladder area as well as a frequent and urgent need to urinate. The bladder is a hollow organ in the lower part of the abdomen. It stores urine after the urine is made in the kidneys. The severity of interstitial cystitis can vary from person to person. You may have flare-ups, and then your symptoms may go away for a while. For many people, it becomes a long-term (chronic) problem. What are the causes? The cause of this condition is not known. What increases the risk? The following factors may make you more likely to develop this condition:  You are female.  You have fibromyalgia.  You have irritable bowel syndrome (IBS).  You have endometriosis. This condition may be aggravated by:  Stress.  Smoking.  Spicy foods. What are the signs or symptoms? Symptoms of interstitial cystitis vary, and they can change over time. Symptoms may include:  Discomfort or pain in the bladder area, which is in the lower abdomen. Pain can range from mild to severe. The pain may change in intensity as the bladder fills with urine or as it empties.  Pain in the pelvic area, between the hip bones.  An urgent need to  urinate.  Frequent urination.  Pain during urination.  Pain during sex.  Blood in the urine. For women, symptoms often get worse during menstruation. How is this diagnosed? This condition is diagnosed based on your symptoms, your medical history, and a physical exam. You may have tests to rule out other conditions, such as:  Urine tests.  Cystoscopy. For this test, a tool similar to a very thin  telescope is used to look into your bladder.  Biopsy. This involves taking a sample of tissue from the bladder to be examined under a microscope. How is this treated? There is no cure for this condition, but treatment can help you control your symptoms. Work closely with your health care provider to find the most effective treatments for you. Treatment options may include:  Medicines to relieve pain and reduce how often you feel the need to urinate.  Learning ways to control when you urinate (bladder training).  Lifestyle changes, such as changing your diet or taking steps to control stress.  Using a device that provides electrical stimulation to your nerves, which can relieve pain (neuromodulation therapy). The device is placed on your back, where it blocks the nerves that cause you to feel pain in your bladder area.  A procedure that stretches your bladder by filling it with air or fluid.  Surgery. This is rare. It is only done for extreme cases, if other treatments do not help. Follow these instructions at home: Bladder training   Use bladder training techniques as directed. Techniques may include: ? Urinating at scheduled times. ? Training yourself to delay urination. ? Doing exercises (Kegel exercises) to strengthen the muscles that control urine flow.  Keep a bladder diary. ? Write down the times that you urinate and any symptoms that you have. This can help you find out which foods, liquids, or activities make your symptoms worse. ? Use your bladder diary to schedule bathroom trips. If you are away from home, plan to be near a bathroom at each of your scheduled times.  Make sure that you urinate just before you leave the house and just before you go to bed. Eating and drinking  Make dietary changes as recommended by your health care provider. You may need to avoid: ? Spicy foods. ? Foods that contain a lot of potassium.  Limit your intake of beverages that make you need to  urinate. These include: ? Caffeinated beverages like soda, coffee, and tea. ? Alcohol. General instructions  Take over-the-counter and prescription medicines only as told by your health care provider.  Do not drink alcohol.  You can try a warm or cool compress over your bladder for comfort.  Avoid wearing tight clothing.  Do not use any products that contain nicotine or tobacco, such as cigarettes and e-cigarettes. If you need help quitting, ask your health care provider.  Keep all follow-up visits as told by your health care provider. This is important. Contact a health care provider if you have:  Symptoms that do not get better with treatment.  Pain or discomfort that gets worse.  More frequent urges to urinate.  A fever. Get help right away if:  You have no control over when you urinate. Summary  Interstitial cystitis is inflammation of the bladder.  This condition may cause pain in the bladder area as well as a frequent and urgent need to urinate.  You may have flare-ups of the condition, and then it may go away for a while. For many people, it  becomes a long-term (chronic) problem.  There is no cure for interstitial cystitis, but treatment methods are available to control your symptoms. This information is not intended to replace advice given to you by your health care provider. Make sure you discuss any questions you have with your health care provider. Document Revised: 07/20/2017 Document Reviewed: 07/02/2017 Elsevier Patient Education  2020 [employer].

## 2019-11-13 LAB — URINALYSIS, COMPLETE
Bilirubin, UA: NEGATIVE
Glucose, UA: NEGATIVE
Ketones, UA: NEGATIVE
Leukocytes,UA: NEGATIVE
Nitrite, UA: NEGATIVE
Protein,UA: NEGATIVE
RBC, UA: NEGATIVE
Specific Gravity, UA: 1.025 (ref 1.005–1.030)
Urobilinogen, Ur: 0.2 mg/dL (ref 0.2–1.0)
pH, UA: 5.5 (ref 5.0–7.5)

## 2019-11-13 LAB — MICROSCOPIC EXAMINATION
Bacteria, UA: NONE SEEN
RBC, Urine: NONE SEEN /HPF (ref 0–2)

## 2019-12-15 ENCOUNTER — Ambulatory Visit: Payer: Medicaid Other

## 2019-12-22 ENCOUNTER — Ambulatory Visit: Payer: Medicaid Other

## 2019-12-29 ENCOUNTER — Ambulatory Visit: Payer: Medicaid Other | Attending: Urology

## 2019-12-29 ENCOUNTER — Other Ambulatory Visit: Payer: Self-pay

## 2019-12-29 DIAGNOSIS — M62830 Muscle spasm of back: Secondary | ICD-10-CM | POA: Diagnosis present

## 2019-12-29 DIAGNOSIS — R293 Abnormal posture: Secondary | ICD-10-CM | POA: Diagnosis not present

## 2019-12-29 DIAGNOSIS — M62838 Other muscle spasm: Secondary | ICD-10-CM

## 2019-12-29 NOTE — Patient Instructions (Signed)
Your Vagina is Not Cussing! 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 Should that be "crotches"? Can you make that plural? If not, why not? Tell me that.. But, cleaning the crotch - it's important. We clean our face, our armpits and our feet. The crotch has got a lot going on so it should be cleaned too, right? Women clean themselves differently, but that's not necessarily okay. There are some basic facts that are important to know when it comes to keeping your machine well-oiled and running, regardless of whether she's a Gore or a 2015 [employer]; cuz either way she's a beauty, right? So what is the right way for a woman to clean her vulvovaginal area in order to ensure cleanliness, odor reduction and avoidance of infection? Let's start with what I hear from patients: 1. "I usually douche because that's what my mother did." 2. "I use a lavender scented soap all over my body and I get a wash rag and scrub my vulva." 3. "I spread my labias and get soap on them and then I put soap inside my vagina. I'm very clean." 4. "I'm careful, so I go front to back with the soap. I start at the vulva and soap it real good, then I reach over to my anus and get that soapy." 5. "I use a loofa on my vulva and then after I shower I spray a little perfume down there. You never know what's going to happen that day." Friends, Romans, Countrywomen - lend me your ear! All these people are WRONG! (and that's probably one reason why they're seeing me in the first place) If you want my advice, I'm going to be succinct, clear and direct. You can wash your vulvovaginal area any way you'd like as long as you are in the shower, eliminate all soap and let warm water run over the area and only use your hands. Just call me the Lorene Dy of the vagina.or is that weird?  Here's what I want you to  do: 1. Wash your hair. 2. Wash your body with soap. 3. Rinse everything off. 4. Let warm water rinse over your labias. Yes, you can spread your labias. 5. Let warm water rinse over your anus. 6. Get out of the shower.** 7. Gently and lovingly pat the vulva dry and put on white, cotton underwear. **You can wash your hands before getting out. So why am I so restricting? Here's why: 1. The vagina is self-cleansing. There is no need to douche or soap inside the vagina. It's already got a good bacteria called lactobacilli that has several important functions. Lactobacilli eats up bad bacteria that can cause infection, it keeps the vagina acidic in order to reduce the likelihood of infections and it's even postulated that lactobacilli can prompt the immune system. This helps reduce odor, infection and keeps the natural flora healthy. Oh, and get this - estrogen helps to feed lactobacilli. So if you're low on estrogen, it makes sense that you might be prone to more infections. Please, just don't use soap on the vulva or in the vagina. Trust me, your vagina is not cussing. (Ironically enough, the inside of the mouth is made up of the same durable tissue as the inside of the vagina.) 2. The vulva wasn't meant to be scrubbed - it's not a potato. The vulva is sensitive  like your fingertips, the skin around your eyes and your lips. It's meant to detect fine detail (for pleasure), so being forceful with it is going to make it more sensitive in a negative way - hypersensitive (for pain). Scrubbing can remove a fine layer of the vulvovaginal tissue which can create an anti-histamine response - much like scrubbing your arm would make your arm red. This creates an inflammatory cascade of events. Many people will heal from this quite quickly and may not notice any discomfort, but others may start to notice some irritation after some time. This is when you might start noticing sensitivity to things that never bothered you  before like tight clothes, colorful underwear, lubricants or laundry detergent. 3. Scented items (or items with chemicals) like perfume (on the vulva), soap, bubble bath or even flavored or hot/cold/tingly/prickly/naughty sexual lubricant/condoms should be avoided as well because they could irritate the opening of the vagina (the vulvar vestibule) or the vagina itself. The vulvar vestibule is made of up different tissue than the vagina (but the same tissue as the urethra and bladder), so it's possible that using chemical products here can cause pain and the symptoms of a urinary tract infection (UTI). 4. The vagina needs to breathe. Wearing tight, conforming clothing all the time or daily pantyliners can be suffocating to your vulvovaginal area and irritating to the skin. Give it a break sometimes and wear looser clothing and or no underwear at all (like at night). 5. If you have a sensitive vulva or are prone to a lot of symptoms of infections, consider wearing white, cotton underwear instead of the fancy stuff. Over time, it's possible to develop new allergies and unfortunately, some women develop allergies to synthetic materials and dyes in their underwear. This also means it's best to wash your underwear with a detergent that is made for sensitive skin and is free of chemicals. ** Note - we will expand this area in the near future (with Sara's blessings) to include other options for under wear or safe liners. Stay tuned!  And get this: Discharge doesn't mean you are dirty. Discharge is natural and comes from a variety of places, most of which are not the vagina itself. What you see on your underwear is a mix of oil and gland secretions from the vulva and it's also secretions from the uterus and the fallopian tubes. Discharge changes during different parts of the menstrual cycle because it serves different purposes. For example, when you are ovulating, the discharge is a different consistency so that sperm  can pass through it more easily. It's all normal and healthy. However, if it starts to change colors or smell really funky - this indicates a possible issue with an area that is potentially apart from the vagina. Soaping and scrubbing to high Charlean Sanfilippo is not going to fix this - you really need to get checked out by a doctor in this situation. Taking care of the vulvovaginal tissue is easier than we want it to be. Less is more. So much more. Good, simple vulvovaginal hygiene means better flora (not fauna), reduced odor, less itching and less discomfort. So cheers to you and your polite vagina. That little number was raised right! -Lavone Orn. Sauder PT, DPT    When seated, you want to maintain pelvic neutral with the shoulders gently down and back and ears in line with your shoulders. A lumbar roll such as the one below or a home-made towel-roll can be used for this purpose.  Even Olympic athletes can only maintain proper seated posture for about 10 minutes without support!    Pictured: The Original [employer] Lumbar Roll

## 2019-12-29 NOTE — Therapy (Signed)
[place] MAIN [facility] SERVICES [address] [place], [place], [address] Phone: [phone]   Fax:  [phone]  Physical Therapy Evaluation  The patient has been informed of current processes in place at Outpatient Rehab to protect patients from Covid-19 exposure including social distancing, schedule modifications, and new cleaning procedures. After discussing their particular risk with a therapist based on the patient's personal risk factors, the patient has decided to proceed with in-person therapy.   Patient Details  Name: Maria [name] MRN: [id number] Date of Birth: 1991-04-15 No data recorded  Encounter Date: 12/29/2019  PT End of Session - 12/29/19 1253    Visit Number  1    Number of Visits  10    Date for PT Re-Evaluation  03/08/20    Authorization Type  mcaid    Authorization Time Period  5/10-6/7 4 visits    Authorization - Visit Number  1    Authorization - Number of Visits  4    Progress Note Due on Visit  10    PT Start Time  1030    PT Stop Time  1145    PT Time Calculation (min)  75 min    Activity Tolerance  Patient tolerated treatment well;No increased pain    Behavior During Therapy  WFL for tasks assessed/performed       Past Medical History:  Diagnosis Date  . Acute pancreatitis   . Asthma   . GERD (gastroesophageal reflux disease)   . Hemorrhoid   . Hx of varicella   . Ovarian cyst   . Recurrent boils    legs and buttocks.  staff infection, was neg  . UTI (urinary tract infection)     Past Surgical History:  Procedure Laterality Date  . CESAREAN SECTION N/A 05/25/2016   Procedure: CESAREAN SECTION;  Surgeon: Christophe Louis, MD;  Location: [facility];  Service: Obstetrics;  Laterality: N/A;  . TONSILLECTOMY    . WISDOM TOOTH EXTRACTION      There were no vitals filed for this visit.     Pelvic Floor Physical Therapy Evaluation and Assessment  SCREENING  Falls in last 6 mo: none    Red  Flags:  Have you had any night sweats? no Unexplained weight loss? no Saddle anesthesia? no Unexplained changes in bowel or bladder habits? no  SUBJECTIVE  Patient reports: She was having a lot of pain in her back but had surgery and now it only hurts when she "does something" to hurt it. Has pelvic pain all the time but it is worse when she has to pee and occasionally feels a "pop" at the pubic symphysis and it hurts when she moves a certain way.  Gets bad headaches when she looks up and sees spots. Has tried taking tylenol/ibuprofen to no avail. They fluctuate rapidly. Gets a HA every day, happens sometimes when she stands from sitting.  She notices burning when using her body wash on her vulva. Bladder issues are common in her family.  Gets really hot and out of breath when walking from her apartment to her car and sweaty. "thinks she is going through menopause".  Precautions:  L4-5 fusion for spondylolysis/herniated disk (Pt. Report), Asthma, GERD, Anxiety, Insomnia.  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  Recent Procedures/Tests/Findings:  Had LB Had a protruding disk and they put 2 rods and and 4 screws to help stabilize where she had a  spondylolisthesis.   Obstetrical History: 1 c-section following a lot of fluid retention and LBP during pregnancy. Labored 28 hrs. Had a miserable experience. She pushed 4 hrs. And then he "got stuck" so they "pushed him back up in" and was born via c-section. 10 lb+ baby.   Has a 50 lb. 6 year old who she is raising by herself.  Gynecological History: Ovarian Cyst, UTI's,  Urinary History: SUI started during pregnancy and has persisted, also urinating ~ 9-12 times per day. Very rarely has leakage with urge. Can delay emptying but the pain increases so she tries not to.  Has cut out soda mostly, drinks "a lot more" water and occasional cranberry juice. Occasionally  Gatorade. Drinks up to 10 bottles of water per day because she gets so thirsty at work walking around in the sun.  "Was having bladder problems even in highschool when she was a virgin."  Gastrointestinal History: Has always had constipation but they got worse after delivery.  Sexual activity/pain: Has stopped having intercourse due to pain.  Location of pain: LBP Current pain:  0/10  Max pain:  8/10 Least pain:  0/10 Nature of pain: dull ache  Location of pain: Pelvis Current pain:  5/10  Max pain:  10/10 Least pain:  3/10 Nature of pain: sharp/burning  Patient Goals: Not have pain when she needs to pee or with intercourse. Not pee her pants with cough/sneeze.   OBJECTIVE  Posture/Observations:  Sitting: anterior pelvic tilt, leaning back with feet crossed at the ankle/ on toes. Standing: Hyperlordotic, short stature.  Palpation/Segmental Motion/Joint Play: Deferred to follow-up  Special tests:  Deferred to follow-up   Range of Motion/Flexibilty: Deferred to follow-up Spine: Hips:   Strength/MMT: Deferred to follow-up LE MMT  LE MMT Left Right  Hip flex:  (L2) /5 /5  Hip ext: /5 /5  Hip abd: /5 /5  Hip add: /5 /5  Hip IR /5 /5  Hip ER /5 /5     Abdominal: Deferred to follow-up Palpation: Diastasis:  Pelvic Floor External Exam: Deferred to follow-up Introitus Appears:  Skin integrity:  Palpation: Cough: Prolapse visible?: Scar mobility:  Internal Vaginal Exam: Strength (PERF):  Symmetry: Palpation: Prolapse:   Internal Rectal Exam: Strength (PERF): Symmetry: Palpation: Prolapse:   Gait Analysis: Deferred to follow-up  Pelvic Floor Outcome Measures: FOTO PFDI Urinary: 63, Urinary Problem: 64   INTERVENTIONS THIS SESSION: Self-care: Educated on the structure and function of the pelvic floor in relation to their symptoms as well as the POC, and initial HEP in order to set patient expectations and understanding from which we will  build on in the future sessions. Educated on vulvar hygiene and lumbar support/seated posture to decrease tension/stress on lumbar nerve roots to decrease pain and Sx. Encouraged to seek counseling for anxiety to help decrease overall tone and allow for decreased PFM issues.   Total time: 75 min.                Objective measurements completed on examination: See above findings.                PT Short Term Goals - 12/29/19 1426      PT SHORT TERM GOAL #1   Title  Patient will demonstrate improved pelvic alignment and balance of musculature surrounding the pelvis to facilitate decreased PFM spasms and decrease pelvic pain.    Baseline  lower crossed syndrome    Time  5    Period  Weeks  Status  New    Target Date  02/02/20      PT SHORT TERM GOAL #2   Title  Patient will demonstrate HEP x1 in the clinic to demonstrate understanding and proper form to allow for further improvement.    Baseline  Pt. lacks knowledge of therapeutic exercises that will help decrease her Sx.    Time  5    Period  Weeks    Status  New    Target Date  02/02/20      PT SHORT TERM GOAL #3   Title  Patient will report consistent use of foot-stool (squatty-potty) for positioning with BM to decrease pain with BM and intra-abdominal pressure.    Baseline  Chronic constipation    Time  5    Period  Weeks    Status  New    Target Date  02/02/20      PT SHORT TERM GOAL #4   Title  Pt. will demonstrate implementation of behavioral modifications such as fluid intake and use of Urge suppression technique to allow for decreased UUI/frequency.    Baseline  Pt. urinating 9-12 times per day, having painful sensation when she needs to urinate.    Time  5    Period  Weeks    Status  New    Target Date  02/02/20        PT Long Term Goals - 12/29/19 1429      PT LONG TERM GOAL #1   Title  Patient will report no episodes of MUI over the course of the prior two weeks to demonstrate  improved functional ability.    Baseline  Pt. having SUI with cough/sneeze, frequency of 9-12 times per day, and occl. urge incontinence.    Time  10    Period  Weeks    Status  New    Target Date  03/08/20      PT LONG TERM GOAL #2   Title  Patient will describe pain no greater than 3/10 during work and when holding her 31 year old for ~ 30 min. to demonstrate improved functional ability.    Baseline  Has pain as high as 8/10 with lifting her son and taking leaf blower up/down stairs.    Time  10    Period  Weeks    Status  New    Target Date  03/08/20      PT LONG TERM GOAL #3   Title  Patient will report having BM's at least every-other day with consistency between [facility] stool scale 3-5 over the prior week to demonstrate decreased constipation.    Baseline  Chronic constipation which has been worse since surgery.    Time  10    Period  Weeks    Status  New    Target Date  03/08/20      PT LONG TERM GOAL #4   Title  Pt. will improve in FOTO score by 10 points to demonstrate improved function.    Baseline  FOTO PFDI Urinary: 63, Urinary Problem: 64    Time  10    Period  Weeks    Status  New    Target Date  03/08/20             Plan - 12/29/19 1254    Clinical Impression Statement  Pt. is a 29 y/o female who presents today with cheif c/o pelvc pain, MUI, dyspareunia, LBP, constipation, and frequency. Her PMH is significant for L4-5  fusion for spondylolysis/herniated disk (Pt. Report), Asthma, GERD, Anxiety, Insomnia and 1 c-section after 4 hours of pushing. Her clinical exam was limited due to her extensive relevant history but revealed breathing dysfunction and short stature leading to lower crossed syndrome and spasms surrounding the pelvis as well as a history that suggests PFM spasms and poor coordination. She will benefit from skilled PT to address the noted deficits and continue to assess for and address any other causes of pain and Sx.    Personal Factors and  Comorbidities  Social Background;Comorbidity 3+    Comorbidities  L4-5 fusion for spondylolysis/herniated disk (Pt. Report), Asthma, GERD, Anxiety, Insomnia    Examination-Activity Limitations  Bend;Lift;Stairs;Continence;Toileting;Carry;Caring for Others    Examination-Participation Restrictions  Yard Work;Cleaning    Stability/Clinical Decision Making  Unstable/Unpredictable    Clinical Decision Making  High    Rehab Potential  Good    PT Frequency  1x / week    PT Duration  Other (comment)   10 weeks   PT Treatment/Interventions  ADLs/Self Care Home Management;Biofeedback;Electrical Stimulation;Moist Heat;Traction;Ultrasound;Therapeutic activities;Functional mobility training;Stair training;Gait training;Therapeutic exercise;Balance training;Neuromuscular re-education;Patient/family education;Manual techniques;Scar mobilization;Taping;Dry needling;Passive range of motion;Joint Manipulations;Spinal Manipulations    PT Next Visit Plan  assess for scoliosis and pelvic obliquity/LLD and diastasis. treat appropriately.    PT Home Exercise Plan  vulvar hygiene, lumbar support/seated posture.    Consulted and Agree with Plan of Care  Patient       Patient will benefit from skilled therapeutic intervention in order to improve the following deficits and impairments:  Increased muscle spasms, Decreased range of motion, Decreased scar mobility, Improper body mechanics, Decreased activity tolerance, Decreased coordination, Decreased strength, Increased fascial restricitons, Postural dysfunction, Pain  Visit Diagnosis: Abnormal posture  Muscle spasm of back  Other muscle spasm     Problem List Patient Active Problem List   Diagnosis Date Noted  . Therapeutic opioid induced constipation 11/12/2019  . Spinal stenosis of lumbar region 11/12/2019  . Pelvic pain in female 11/12/2019  . Gastroesophageal reflux disease 11/12/2019  . Pre-bariatric surgery nutrition evaluation 08/11/2019  .  Spondylolysis 10/30/2018  . Chronic back pain 10/30/2018  . Thrombocytosis (HCC) 06/09/2016  . S/P primary low transverse C-section 05/25/2016  . Allergy to penicillin (rash) 05/24/2016   Cleophus Molt DPT, ATC Cleophus Molt 12/29/2019, 2:35 PM  [place] [facility] MAIN [facility] SERVICES [address] [place], [place], [address] Phone: [phone]   Fax:  [phone]  Name: Paislynn Hegstrom MRN: [id number] Date of Birth: 06/14/1991

## 2020-01-05 ENCOUNTER — Ambulatory Visit: Payer: Medicaid Other

## 2020-01-05 ENCOUNTER — Other Ambulatory Visit: Payer: Self-pay

## 2020-01-05 DIAGNOSIS — M62838 Other muscle spasm: Secondary | ICD-10-CM

## 2020-01-05 DIAGNOSIS — R293 Abnormal posture: Secondary | ICD-10-CM | POA: Diagnosis not present

## 2020-01-05 DIAGNOSIS — M62830 Muscle spasm of back: Secondary | ICD-10-CM

## 2020-01-05 NOTE — Patient Instructions (Signed)
   Hold for 30 seconds (5 deep breaths) and repeat 2-3 times on each side once a day    Hold-relax: Gently lift the knee of the down leg up ~ 1/2 and inch and hold for 5 seconds, then let it relax all the way for a second and repeat 4 more times to decrease resting tension in the muscle.   Stretch: Let the leg relax and feel a stretch across the front of the hip/thigh as you take belly breaths. Hold for __5__ deep belly breaths. Relax. Repeat __2-3__ times per side.    Do this __1-2__ times per day.     Do 2 sets of 15 tilts per day. Breathe in when you tilt forward (A) and out when you tuck under (B).

## 2020-01-05 NOTE — Therapy (Signed)
[place] MAIN [facility] SERVICES [address] [place], [place], [address] Phone: [phone]   Fax:  [phone]  Physical Therapy Treatment  The patient has been informed of current processes in place at Outpatient Rehab to protect patients from Covid-19 exposure including social distancing, schedule modifications, and new cleaning procedures. After discussing their particular risk with a therapist based on the patient's personal risk factors, the patient has decided to proceed with in-person therapy.   Patient Details  Name: Maria [name] MRN: [id number] Date of Birth: [birth year] No data recorded  Encounter Date: 01/05/2020  PT End of Session - 01/05/20 1252    Visit Number  2    Number of Visits  10    Date for PT Re-Evaluation  03/08/20    Authorization Type  mcaid    Authorization Time Period  5/10-6/7 4 visits    Authorization - Visit Number  2    Authorization - Number of Visits  4    Progress Note Due on Visit  10    PT Start Time  0174    PT Stop Time  1149    PT Time Calculation (min)  72 min    Activity Tolerance  Patient tolerated treatment well;No increased pain    Behavior During Therapy  WFL for tasks assessed/performed       Past Medical History:  Diagnosis Date  . Acute pancreatitis   . Asthma   . GERD (gastroesophageal reflux disease)   . Hemorrhoid   . Hx of varicella   . Ovarian cyst   . Recurrent boils    legs and buttocks.  staff infection, was neg  . UTI (urinary tract infection)     Past Surgical History:  Procedure Laterality Date  . CESAREAN SECTION N/A 05/25/2016   Procedure: CESAREAN SECTION;  Surgeon: Christophe Louis, MD;  Location: [place];  Service: Obstetrics;  Laterality: N/A;  . TONSILLECTOMY    . WISDOM TOOTH EXTRACTION      There were no vitals filed for this visit.   Pelvic Floor Physical Therapy Treatment Note  SCREENING  Changes in medications, allergies, or medical  history?:  None    SUBJECTIVE  Patient reports: No change since last visit.  Precautions:  L4-5 fusion for spondylolysis/herniated disk (Pt. Report), Asthma, GERD, Anxiety, Insomnia.  Sexual activity/pain: Has stopped having intercourse due to pain.  Location of pain: LBP Current pain: 6/10  Max pain: 8/10 Least pain: 0/10 Nature of pain:dull ache  Location of pain: Pelvis Current pain: 5/10  Max pain: 10/10 Least pain: 3/10 Nature of pain:sharp/burning  **no pain following treatment.  Patient Goals: Not have pain when she needs to pee or with intercourse. Not pee her pants with cough/sneeze.   OBJECTIVE  Changes in: Posture/Observations:  L handed, L ASIS low, L PSIS low.   R up-slip, L anterior rotation.  ** 90% improved following treatment.  Range of Motion/Flexibilty:  R SB ~ 4 fingers from knee, L side ~ 2 fingers from knee sore at L SIJ with R SB, ROT ~ 50% restricted to the R ~ 30% to the L but pain with L ROT.   Special tests:  Supine to long-sit: LLE long in lying  Scoliosis: L thoracic ? R lumbar (fusion) Leg-length: RLE ~ 1/2 cm shorter  Strength/MMT:  LE MMT:  Pelvic floor:  Abdominal:  3 finger diastasis at umbilicus, 2 above.   Palpation: TTP to R QL, L iliacus  Gait  Analysis:  INTERVENTIONS THIS SESSION: Manual: performed TP release to R QL, L iliacus followed by R up-slip correction and MET correction x2 for L anterior rotation to decrease spasm and pain and allow for improved balance of musculature and pelvic alignment for improved function and decreased symptoms.  Therex: Educated on and practiced seated pelvic tilts to decrease LB tension and engage TA and side-stretch, and hip-flexor stretch at EOB To maintain and improve muscle length and allow for improved balance of musculature for long-term symptom relief.   Total time: 72                              PT Short Term Goals - 12/29/19  1426      PT SHORT TERM GOAL #1   Title  Patient will demonstrate improved pelvic alignment and balance of musculature surrounding the pelvis to facilitate decreased PFM spasms and decrease pelvic pain.    Baseline  lower crossed syndrome    Time  5    Period  Weeks    Status  New    Target Date  02/02/20      PT SHORT TERM GOAL #2   Title  Patient will demonstrate HEP x1 in the clinic to demonstrate understanding and proper form to allow for further improvement.    Baseline  Pt. lacks knowledge of therapeutic exercises that will help decrease her Sx.    Time  5    Period  Weeks    Status  New    Target Date  02/02/20      PT SHORT TERM GOAL #3   Title  Patient will report consistent use of foot-stool (squatty-potty) for positioning with BM to decrease pain with BM and intra-abdominal pressure.    Baseline  Chronic constipation    Time  5    Period  Weeks    Status  New    Target Date  02/02/20      PT SHORT TERM GOAL #4   Title  Pt. will demonstrate implementation of behavioral modifications such as fluid intake and use of Urge suppression technique to allow for decreased UUI/frequency.    Baseline  Pt. urinating 9-12 times per day, having painful sensation when she needs to urinate.    Time  5    Period  Weeks    Status  New    Target Date  02/02/20        PT Long Term Goals - 12/29/19 1429      PT LONG TERM GOAL #1   Title  Patient will report no episodes of MUI over the course of the prior two weeks to demonstrate improved functional ability.    Baseline  Pt. having SUI with cough/sneeze, frequency of 9-12 times per day, and occl. urge incontinence.    Time  10    Period  Weeks    Status  New    Target Date  03/08/20      PT LONG TERM GOAL #2   Title  Patient will describe pain no greater than 3/10 during work and when holding her 31 year old for ~ 30 min. to demonstrate improved functional ability.    Baseline  Has pain as high as 8/10 with lifting her son and  taking leaf blower up/down stairs.    Time  10    Period  Weeks    Status  New    Target Date  03/08/20  PT LONG TERM GOAL #3   Title  Patient will report having BM's at least every-other day with consistency between [facility] stool scale 3-5 over the prior week to demonstrate decreased constipation.    Baseline  Chronic constipation which has been worse since surgery.    Time  10    Period  Weeks    Status  New    Target Date  03/08/20      PT LONG TERM GOAL #4   Title  Pt. will improve in FOTO score by 10 points to demonstrate improved function.    Baseline  FOTO PFDI Urinary: 63, Urinary Problem: 64    Time  10    Period  Weeks    Status  New    Target Date  03/08/20            Plan - 01/05/20 1253    Clinical Impression Statement  Pt. Responded well to all interventions today, demonstrating improved pelvic alignment, decreased spasm and resolution of pain as well as understanding and correct performance of all education and exercises provided today. They will continue to benefit from skilled physical therapy to work toward remaining goals and maximize function as well as decrease likelihood of symptom increase or recurrence.    PT Next Visit Plan  re-measure LLD and re-assess for obliquity. Teach deep-core and do TP release vs. DN to low back, LB stretch    PT Home Exercise Plan  vulvar hygiene, lumbar support/seated posture, seated pelvic tilts, hip flexor stretch at EOB, side stretch.    Consulted and Agree with Plan of Care  Patient       Patient will benefit from skilled therapeutic intervention in order to improve the following deficits and impairments:     Visit Diagnosis: Abnormal posture  Muscle spasm of back  Other muscle spasm     Problem List Patient Active Problem List   Diagnosis Date Noted  . Therapeutic opioid induced constipation 11/12/2019  . Spinal stenosis of lumbar region 11/12/2019  . Pelvic pain in female 11/12/2019  .  Gastroesophageal reflux disease 11/12/2019  . Pre-bariatric surgery nutrition evaluation 08/11/2019  . Spondylolysis 10/30/2018  . Chronic back pain 10/30/2018  . Thrombocytosis ([place]) 06/09/2016  . S/P primary low transverse C-section 05/25/2016  . Allergy to penicillin (rash) 05/24/2016   Willa Rough DPT, ATC Willa Rough 01/05/2020, 12:59 PM  [place] MAIN [facility] SERVICES [address] [place], [place], [address] Phone: [phone]   Fax:  [phone]  Name: Maria [name] MRN: [id number] Date of Birth: Jan 12, 1991

## 2020-01-12 ENCOUNTER — Ambulatory Visit: Payer: Medicaid Other

## 2020-01-14 ENCOUNTER — Encounter: Payer: Self-pay | Admitting: Urology

## 2020-01-14 ENCOUNTER — Ambulatory Visit: Payer: Self-pay | Admitting: Urology

## 2020-01-14 ENCOUNTER — Other Ambulatory Visit: Payer: Self-pay

## 2020-01-14 ENCOUNTER — Ambulatory Visit: Payer: Medicaid Other

## 2020-01-14 DIAGNOSIS — M62830 Muscle spasm of back: Secondary | ICD-10-CM

## 2020-01-14 DIAGNOSIS — R293 Abnormal posture: Secondary | ICD-10-CM

## 2020-01-14 DIAGNOSIS — M62838 Other muscle spasm: Secondary | ICD-10-CM

## 2020-01-14 NOTE — Therapy (Signed)
[employer] MAIN [facility] SERVICES [address] [place], [place], [address] Phone: [phone]   Fax:  [phone]  Physical Therapy Treatment  The patient has been informed of current processes in place at Outpatient Rehab to protect patients from Covid-19 exposure including social distancing, schedule modifications, and new cleaning procedures. After discussing their particular risk with a therapist based on the patient's personal risk factors, the patient has decided to proceed with in-person therapy.  Patient Details  Name: Maria [name] MRN: [id number] Date of Birth: October 19, 1990 No data recorded  Encounter Date: 01/14/2020  PT End of Session - 01/14/20 1439    Visit Number  3    Number of Visits  10    Date for PT Re-Evaluation  03/08/20    Authorization Type  mcaid    Authorization Time Period  5/10-6/7 4 visits    Authorization - Visit Number  3    Authorization - Number of Visits  4    Progress Note Due on Visit  10    PT Start Time  4401    PT Stop Time  1535    PT Time Calculation (min)  60 min    Activity Tolerance  Patient tolerated treatment well;No increased pain    Behavior During Therapy  WFL for tasks assessed/performed       Past Medical History:  Diagnosis Date  . Acute pancreatitis   . Asthma   . GERD (gastroesophageal reflux disease)   . Hemorrhoid   . Hx of varicella   . Ovarian cyst   . Recurrent boils    legs and buttocks.  staff infection, was neg  . UTI (urinary tract infection)     Past Surgical History:  Procedure Laterality Date  . CESAREAN SECTION N/A 05/25/2016   Procedure: CESAREAN SECTION;  Surgeon: Christophe Louis, MD;  Location: [place];  Service: Obstetrics;  Laterality: N/A;  . TONSILLECTOMY    . WISDOM TOOTH EXTRACTION      There were no vitals filed for this visit.   Pelvic Floor Physical Therapy Treatment Note  SCREENING  Changes in medications, allergies, or medical  history?:  None    SUBJECTIVE  Patient reports: Not burning "like I was" around the bladder anymore at rest and only some of the time when she pees. It is worst very first thing in the morning.  Precautions:  L4-5 fusion for spondylolysis/herniated disk (Pt. Report), Asthma, GERD, Anxiety, Insomnia.  Sexual activity/pain: Has stopped having intercourse due to pain.  Location of pain: LBP Current pain: 0/10  Max pain: 6/10 Least pain: 0/10 Nature of pain:dull ache  Location of pain: Pelvis Current pain: 0/10  Max pain: 0/10 Least pain: 0/10 Nature of pain:sharp/burning  **no pain following treatment.  Patient Goals: Not have pain when she needs to pee or with intercourse. Not pee her pants with cough/sneeze.   OBJECTIVE  Changes in: Posture/Observations:  L handed, L ASIS low, L PSIS low. Anterior pelvic tilt  **PSIS level following addition of heel-lift.  Range of Motion/Flexibilty:  R SB ~ 4 fingers from knee, L side ~ 2 fingers from knee sore at L SIJ with R SB, ROT ~ 50% restricted to the R ~ 30% to the L but pain with L ROT. (from previous note)  Special tests:  (from previous note) Supine to long-sit: LLE long in lying  Scoliosis: L thoracic ? R lumbar (fusion)  Strength/MMT:  LE MMT:  Pelvic floor:  Abdominal:  3 finger diastasis at umbilicus, 2 above.   Palpation: TTP to B QL, ~L4 multifidus and paraspinals.  Gait Analysis:  INTERVENTIONS THIS SESSION: Manual: performed TP release to B QL, ~L4 multifidus and paraspinals followed by grade 3-4 PA mobs at all sacral borders to decrease spasm and pain and allow for improved balance of musculature and pelvic alignment for improved function and decreased symptoms.  Theract: educated on and given a heel-lift for the LLE to allow for decreased shearing force at the SIJ and decreased irritability of the nerves for decreased pain and spasm.   Total time:  60                          Trigger Point Dry Needling - 01/14/20 0001    Consent Given?  Yes    Education Handout Provided  No    Muscles Treated Back/Hip  Erector spinae;Lumbar multifidi;Quadratus lumborum    Erector spinae Response  Twitch response elicited;Palpable increased muscle length    Lumbar multifidi Response  Twitch response elicited;Palpable increased muscle length    Quadratus Lumborum Response  Twitch response elicited;Palpable increased muscle length             PT Short Term Goals - 12/29/19 1426      PT SHORT TERM GOAL #1   Title  Patient will demonstrate improved pelvic alignment and balance of musculature surrounding the pelvis to facilitate decreased PFM spasms and decrease pelvic pain.    Baseline  lower crossed syndrome    Time  5    Period  Weeks    Status  New    Target Date  02/02/20      PT SHORT TERM GOAL #2   Title  Patient will demonstrate HEP x1 in the clinic to demonstrate understanding and proper form to allow for further improvement.    Baseline  Pt. lacks knowledge of therapeutic exercises that will help decrease her Sx.    Time  5    Period  Weeks    Status  New    Target Date  02/02/20      PT SHORT TERM GOAL #3   Title  Patient will report consistent use of foot-stool (squatty-potty) for positioning with BM to decrease pain with BM and intra-abdominal pressure.    Baseline  Chronic constipation    Time  5    Period  Weeks    Status  New    Target Date  02/02/20      PT SHORT TERM GOAL #4   Title  Pt. will demonstrate implementation of behavioral modifications such as fluid intake and use of Urge suppression technique to allow for decreased UUI/frequency.    Baseline  Pt. urinating 9-12 times per day, having painful sensation when she needs to urinate.    Time  5    Period  Weeks    Status  New    Target Date  02/02/20        PT Long Term Goals - 12/29/19 1429      PT LONG TERM GOAL #1   Title   Patient will report no episodes of MUI over the course of the prior two weeks to demonstrate improved functional ability.    Baseline  Pt. having SUI with cough/sneeze, frequency of 9-12 times per day, and occl. urge incontinence.    Time  10    Period  Weeks    Status  New  Target Date  03/08/20      PT LONG TERM GOAL #2   Title  Patient will describe pain no greater than 3/10 during work and when holding her 31 year old for ~ 30 min. to demonstrate improved functional ability.    Baseline  Has pain as high as 8/10 with lifting her son and taking leaf blower up/down stairs.    Time  10    Period  Weeks    Status  New    Target Date  03/08/20      PT LONG TERM GOAL #3   Title  Patient will report having BM's at least every-other day with consistency between [facility] stool scale 3-5 over the prior week to demonstrate decreased constipation.    Baseline  Chronic constipation which has been worse since surgery.    Time  10    Period  Weeks    Status  New    Target Date  03/08/20      PT LONG TERM GOAL #4   Title  Pt. will improve in FOTO score by 10 points to demonstrate improved function.    Baseline  FOTO PFDI Urinary: 63, Urinary Problem: 64    Time  10    Period  Weeks    Status  New    Target Date  03/08/20            Plan - 01/14/20 1727    Clinical Impression Statement  Pt. Responded well to all interventions today, demonstrating improved pelvic alignment, decreased spasm and pain with posterior pelvic tilt as well as understanding and correct performance of all education and exercises provided today. They will continue to benefit from skilled physical therapy to work toward remaining goals and maximize function as well as decrease likelihood of symptom increase or recurrence.     PT Next Visit Plan  Teach deep-core and LB stretch, glute strengthening. Assess internally and release PRN.    PT Home Exercise Plan  vulvar hygiene, lumbar support/seated posture, seated  pelvic tilts, hip flexor stretch at EOB, side stretch heel-lift for LLE.    Consulted and Agree with Plan of Care  Patient       Patient will benefit from skilled therapeutic intervention in order to improve the following deficits and impairments:     Visit Diagnosis: Abnormal posture  Muscle spasm of back  Other muscle spasm     Problem List Patient Active Problem List   Diagnosis Date Noted  . Therapeutic opioid induced constipation 11/12/2019  . Spinal stenosis of lumbar region 11/12/2019  . Pelvic pain in female 11/12/2019  . Gastroesophageal reflux disease 11/12/2019  . Pre-bariatric surgery nutrition evaluation 08/11/2019  . Spondylolysis 10/30/2018  . Chronic back pain 10/30/2018  . Thrombocytosis (HCC) 06/09/2016  . S/P primary low transverse C-section 05/25/2016  . Allergy to penicillin (rash) 05/24/2016   Cleophus Molt DPT, ATC Cleophus Molt 01/14/2020, 5:35 PM  [place] [facility] MAIN [facility] SERVICES [address] [place], [place], [address] Phone: [phone]   Fax:  [phone]  Name: Maria [name] MRN: [id number] Date of Birth: 1990/10/25

## 2020-01-14 NOTE — Patient Instructions (Signed)

## 2020-01-26 ENCOUNTER — Ambulatory Visit: Payer: Medicaid Other

## 2020-01-27 ENCOUNTER — Ambulatory Visit: Payer: Medicaid Other | Attending: Urology

## 2020-01-27 DIAGNOSIS — M62838 Other muscle spasm: Secondary | ICD-10-CM | POA: Insufficient documentation

## 2020-01-27 DIAGNOSIS — M62830 Muscle spasm of back: Secondary | ICD-10-CM | POA: Insufficient documentation

## 2020-01-27 DIAGNOSIS — R293 Abnormal posture: Secondary | ICD-10-CM | POA: Insufficient documentation

## 2020-02-02 ENCOUNTER — Ambulatory Visit: Payer: Medicaid Other

## 2020-02-03 ENCOUNTER — Ambulatory Visit: Payer: Medicaid Other

## 2020-02-09 ENCOUNTER — Ambulatory Visit: Payer: Medicaid Other

## 2020-02-10 ENCOUNTER — Other Ambulatory Visit: Payer: Self-pay

## 2020-02-10 ENCOUNTER — Ambulatory Visit: Payer: Medicaid Other

## 2020-02-10 DIAGNOSIS — R293 Abnormal posture: Secondary | ICD-10-CM

## 2020-02-10 DIAGNOSIS — M62838 Other muscle spasm: Secondary | ICD-10-CM

## 2020-02-10 DIAGNOSIS — M62830 Muscle spasm of back: Secondary | ICD-10-CM

## 2020-02-10 NOTE — Patient Instructions (Addendum)
   Tuck your hips under, then keep the tuck as you lean forward so you feel a stretch through your low back. Hold for 5 deep breaths, repeat 2-3 times, 1-2 times per day.  Seated -in chair hamstring stretch    Perform 2 sets of 10 belly breaths to both sides. Repeat 1-2/day   Action: Stick on leg out, with toes pointing up towards your nose and knee straight. With abdominals engaged, sitting up right, gently lean forward till you feel a gentle stretch at the back of your leg.     Calf stretch:    Bring your toe up onto the wall and gently shift your hips toward the wall until you feel a stretch, hold for 5 deep belly breaths and repeat 2-3 times on each side.   Place the tennis ball under your calf on a tender spot and use extra pressure from your other leg IF NEEDED. Hold until you feel the tenderness release by at least 50% or ideally all the way. Do this for a couple spots on each side every 2-3 days until you do not have much tenderness.  Pelvic Tilt With Pelvic Floor (Hook-Lying)        Lie with hips and knees bent. Feel the lowe tummy muscles (TA) draw tight and flatten low back while breathing out so that pelvis tilts a little. Repeat _2x15__ times. Do _1-2__ times a day.

## 2020-02-10 NOTE — Therapy (Signed)
[facility] MAIN [facility] SERVICES [address] [place], [place], [address] Phone: [phone]   Fax:  [phone]  Physical Therapy Treatment  The patient has been informed of current processes in place at Outpatient Rehab to protect patients from Covid-19 exposure including social distancing, schedule modifications, and new cleaning procedures. After discussing their particular risk with a therapist based on the patient's personal risk factors, the patient has decided to proceed with in-person therapy.   Patient Details  Name: Maria [name] MRN: [id number] Date of Birth: Aug 01, 1991 No data recorded  Encounter Date: 02/10/2020   PT End of Session - 02/13/20 1050    Visit Number 4    Number of Visits 14    Date for PT Re-Evaluation 03/08/20    Authorization Type mcaid    Authorization Time Period 6/8 through 8/30 for 12 visits    Authorization - Visit Number 1    Authorization - Number of Visits 12    Progress Note Due on Visit 10    PT Start Time 0930    PT Stop Time 1030    PT Time Calculation (min) 60 min    Activity Tolerance Patient tolerated treatment well;No increased pain    Behavior During Therapy WFL for tasks assessed/performed           Past Medical History:  Diagnosis Date  . Acute pancreatitis   . Asthma   . GERD (gastroesophageal reflux disease)   . Hemorrhoid   . Hx of varicella   . Ovarian cyst   . Recurrent boils    legs and buttocks.  staff infection, was neg  . UTI (urinary tract infection)     Past Surgical History:  Procedure Laterality Date  . CESAREAN SECTION N/A 05/25/2016   Procedure: CESAREAN SECTION;  Surgeon: Gerald Leitz, MD;  Location: [facility];  Service: Obstetrics;  Laterality: N/A;  . TONSILLECTOMY    . WISDOM TOOTH EXTRACTION      There were no vitals filed for this visit.    Pelvic Floor Physical Therapy Treatment Note  SCREENING  Changes in medications, allergies, or  medical history?:  None    SUBJECTIVE  Patient reports: Not burning "like I was" around the bladder anymore at rest and only some of the time when she pees. It is worst very first thing in the morning.  Has tried intercourse and she is still having pain during, not bad after. Still has burning in the morning but better throughout the rest of the day. Gets sore/achy occasionally, but nothing terrible. Little sore in her calves.  Precautions:  L4-5 fusion for spondylolysis/herniated disk (Pt. Report), Asthma, GERD, Anxiety, Insomnia.  Sexual activity/pain: Has stopped having intercourse due to pain.  Location of pain: LBP  Current pain: 0/10 Max pain: 6/10 Least pain: 0/10 Nature of pain:dull ache  Location of pain: Pelvis Current pain: 0/10  Max pain: 0/10 Least pain: 0/10 Nature of pain:sharp/burning  **no pain following treatment.  Patient Goals: Not have pain when she needs to pee or with intercourse. Not pee her pants with cough/sneeze.   OBJECTIVE  Changes in: Posture/Observations:  L handed, L ASIS low, L PSIS low. Anterior pelvic tilt  **PSIS level following addition of heel-lift.  Range of Motion/Flexibilty:  R SB ~ 4 fingers from knee, L side ~ 2 fingers from knee sore at L SIJ with R SB, ROT ~ 50% restricted to the R ~ 30% to the L but pain with L  ROT. (from previous note)  Special tests:  (from previous note) Supine to long-sit: LLE long in lying  Scoliosis: L thoracic ? R lumbar (fusion)  Strength/MMT:  LE MMT:  Pelvic floor:  Abdominal:  3 finger diastasis at umbilicus, 2 above.   Palpation: TTP to B QL, ~L4 multifidus and paraspinals.  Gait Analysis:  INTERVENTIONS THIS SESSION: Self-care: Educated on how the feet and pelvic floor are interrelated and why she needs to stretch to help decrease tension in both. Educated on the need to do an internal assessment/treatment soon to decrease remaining PFM tension and decrease  pain.  Theract: educated on and given LB stretch, hamstring stretch, calf stretch, self TP release to calves and posterior pelvic tilts in hook-lying to To maintain and improve muscle length and allow for improved balance of musculature for long-term symptom relief and increase deep-core strength to allow for PFM relaxation and decreased burning/pain..   Total time: 60                              PT Short Term Goals - 12/29/19 1426      PT SHORT TERM GOAL #1   Title Patient will demonstrate improved pelvic alignment and balance of musculature surrounding the pelvis to facilitate decreased PFM spasms and decrease pelvic pain.    Baseline lower crossed syndrome    Time 5    Period Weeks    Status New    Target Date 02/02/20      PT SHORT TERM GOAL #2   Title Patient will demonstrate HEP x1 in the clinic to demonstrate understanding and proper form to allow for further improvement.    Baseline Pt. lacks knowledge of therapeutic exercises that will help decrease her Sx.    Time 5    Period Weeks    Status New    Target Date 02/02/20      PT SHORT TERM GOAL #3   Title Patient will report consistent use of foot-stool (squatty-potty) for positioning with BM to decrease pain with BM and intra-abdominal pressure.    Baseline Chronic constipation    Time 5    Period Weeks    Status New    Target Date 02/02/20      PT SHORT TERM GOAL #4   Title Pt. will demonstrate implementation of behavioral modifications such as fluid intake and use of Urge suppression technique to allow for decreased UUI/frequency.    Baseline Pt. urinating 9-12 times per day, having painful sensation when she needs to urinate.    Time 5    Period Weeks    Status New    Target Date 02/02/20             PT Long Term Goals - 12/29/19 1429      PT LONG TERM GOAL #1   Title Patient will report no episodes of MUI over the course of the prior two weeks to demonstrate improved functional  ability.    Baseline Pt. having SUI with cough/sneeze, frequency of 9-12 times per day, and occl. urge incontinence.    Time 10    Period Weeks    Status New    Target Date 03/08/20      PT LONG TERM GOAL #2   Title Patient will describe pain no greater than 3/10 during work and when holding her 31 year old for ~ 30 min. to demonstrate improved functional ability.    Baseline  Has pain as high as 8/10 with lifting her son and taking leaf blower up/down stairs.    Time 10    Period Weeks    Status New    Target Date 03/08/20      PT LONG TERM GOAL #3   Title Patient will report having BM's at least every-other day with consistency between [facility] stool scale 3-5 over the prior week to demonstrate decreased constipation.    Baseline Chronic constipation which has been worse since surgery.    Time 10    Period Weeks    Status New    Target Date 03/08/20      PT LONG TERM GOAL #4   Title Pt. will improve in FOTO score by 10 points to demonstrate improved function.    Baseline FOTO PFDI Urinary: 63, Urinary Problem: 64    Time 10    Period Weeks    Status New    Target Date 03/08/20                 Plan - 02/13/20 1052    Clinical Impression Statement Pt. Responded well to all interventions today, demonstrating improved ROM and HEP performance, decreased calf pain, as well as understanding and correct performance of all education and exercises provided today. They will continue to benefit from skilled physical therapy to work toward remaining goals and maximize function as well as decrease likelihood of symptom increase or recurrence.    PT Next Visit Plan Teach deep-core and LB stretch, glute strengthening. Assess internally and release PRN.    PT Home Exercise Plan vulvar hygiene, lumbar support/seated posture, seated pelvic tilts, hip flexor stretch at EOB, side stretch heel-lift for LLE.    Consulted and Agree with Plan of Care Patient           Patient will benefit  from skilled therapeutic intervention in order to improve the following deficits and impairments:     Visit Diagnosis: Abnormal posture  Muscle spasm of back  Other muscle spasm     Problem List Patient Active Problem List   Diagnosis Date Noted  . Therapeutic opioid induced constipation 11/12/2019  . Spinal stenosis of lumbar region 11/12/2019  . Pelvic pain in female 11/12/2019  . Gastroesophageal reflux disease 11/12/2019  . Pre-bariatric surgery nutrition evaluation 08/11/2019  . Spondylolysis 10/30/2018  . Chronic back pain 10/30/2018  . Thrombocytosis (HCC) 06/09/2016  . S/P primary low transverse C-section 05/25/2016  . Allergy to penicillin (rash) 05/24/2016   Cleophus Molt DPT, ATC Cleophus Molt 02/13/2020, 10:58 AM  [place] [facility] MAIN [facility] SERVICES [address] [place], [place], [address] Phone: [phone]   Fax:  [phone]  Name: Maria [name] MRN: [id number] Date of Birth: [birth year]

## 2020-02-17 ENCOUNTER — Ambulatory Visit: Payer: Medicaid Other

## 2020-02-24 ENCOUNTER — Ambulatory Visit: Payer: Medicaid Other | Attending: Urology

## 2020-02-24 DIAGNOSIS — R293 Abnormal posture: Secondary | ICD-10-CM | POA: Insufficient documentation

## 2020-02-24 DIAGNOSIS — M62838 Other muscle spasm: Secondary | ICD-10-CM | POA: Insufficient documentation

## 2020-02-24 DIAGNOSIS — M62830 Muscle spasm of back: Secondary | ICD-10-CM | POA: Insufficient documentation

## 2020-03-02 ENCOUNTER — Ambulatory Visit: Payer: Medicaid Other

## 2020-03-09 ENCOUNTER — Other Ambulatory Visit: Payer: Self-pay

## 2020-03-09 ENCOUNTER — Ambulatory Visit: Payer: Medicaid Other

## 2020-03-09 DIAGNOSIS — R293 Abnormal posture: Secondary | ICD-10-CM | POA: Diagnosis not present

## 2020-03-09 DIAGNOSIS — M62830 Muscle spasm of back: Secondary | ICD-10-CM

## 2020-03-09 DIAGNOSIS — M62838 Other muscle spasm: Secondary | ICD-10-CM | POA: Diagnosis present

## 2020-03-09 NOTE — Patient Instructions (Signed)
Self External Trigger Point Relief    1) Wash your hands and prop yourself up in a way where you can easily reach the vagina. You may wish to have a small hand-held mirror near by.  2) Use the 2 middle fingers to put gentle pressure on the three external pelvic floor muscles and hold pressure and take deep breaths as you allow the tension to release and discomfort to dissipate   3) Repeat the process for any trigger points you find spending between 3-10 minutes on this every 1-2 days until you do not find any more trigger points or you are told otherwise by your therapist.  Self Posterior Fourchette Stretching/Mobilization    1) Wash your hands and prop your body up so you can easily reach the vagina, bring hand-held mirror if desired.  2) Apply lubricant to the thumb and vaginal opening  3) Place thumb ~ 1/2 an inch into the vagina with the pad of the thumb pointed down and apply gentle pressure to the posterior fourchette.  4) Gently sweep the thumb side to side and in/out while maintaining pressure down toward the anus. Make sure the pressure is not so great that your muscles tighten up and guard, just enough to create slight discomfort.  Do this for ~ 3 min. Per night to decrease tightness and tenderness at the vaginal opening.  Child's Pose Pelvic Floor Lengthening    Sit in knee-chest position and reach arms forward. Separate knees for comfort. Hold position for _5__ breaths. Repeat _2-3__ times. Do _1-2__ times per day.

## 2020-03-09 NOTE — Therapy (Signed)
[place] [facility] MAIN [facility] SERVICES [address] [place], [place], [address] Phone: [phone]   Fax:  [phone]  Physical Therapy Treatment  The patient has been informed of current processes in place at Outpatient Rehab to protect patients from Covid-19 exposure including social distancing, schedule modifications, and new cleaning procedures. After discussing their particular risk with a therapist based on the patient's personal risk factors, the patient has decided to proceed with in-person therapy.  Patient Details  Name: Maria [name] MRN: [id number] Date of Birth: [birth year] No data recorded  Encounter Date: 03/09/2020   PT End of Session - 03/09/20 1052    Visit Number 5    Number of Visits 14    Date for PT Re-Evaluation 03/08/20    Authorization Type mcaid    Authorization Time Period 6/8 through 8/30 for 12 visits    Authorization - Visit Number 2    Authorization - Number of Visits 12    Progress Note Due on Visit 10    PT Start Time 0930    PT Stop Time 1030    PT Time Calculation (min) 60 min    Activity Tolerance Patient tolerated treatment well;No increased pain    Behavior During Therapy WFL for tasks assessed/performed           Past Medical History:  Diagnosis Date  . Acute pancreatitis   . Asthma   . GERD (gastroesophageal reflux disease)   . Hemorrhoid   . Hx of varicella   . Ovarian cyst   . Recurrent boils    legs and buttocks.  staff infection, was neg  . UTI (urinary tract infection)     Past Surgical History:  Procedure Laterality Date  . CESAREAN SECTION N/A 05/25/2016   Procedure: CESAREAN SECTION;  Surgeon: Gerald Leitz, MD;  Location: [facility] BIRTHING SUITES;  Service: Obstetrics;  Laterality: N/A;  . TONSILLECTOMY    . WISDOM TOOTH EXTRACTION      There were no vitals filed for this visit.    Pelvic Floor Physical Therapy Treatment Note  SCREENING  Changes in medications, allergies, or  medical history?:  None    SUBJECTIVE  Patient reports: Was sick with an upper respiratory infection, baby was sick. Back has been great, no soreness. Has pain with intercourse (7/10) but not otherwise. Had some leakage with coughing but not every time. Lost her heel-lift ~ 2 weeks ago.  Precautions:  L4-5 fusion for spondylolysis/herniated disk (Pt. Report), Asthma, GERD, Anxiety, Insomnia.  Sexual activity/pain: Has stopped having intercourse due to pain.  Location of pain: LBP  Current pain: 0/10 Max pain: 0/10 Least pain: 0/10 Nature of pain:dull ache  Location of pain: Pelvis Current pain: 0/10  Max pain: 0/10 Least pain: 0/10 Nature of pain:sharp/burning  **no pain following treatment.  Patient Goals: Not have pain when she needs to pee or with intercourse. Not pee her pants with cough/sneeze.   OBJECTIVE  Changes in: Posture/Observations:  Unable to assess appropriately due to Pt. losing her heel-lift   Range of Motion/Flexibilty:  R SB ~ 4 fingers from knee, L side ~ 2 fingers from knee sore at L SIJ with R SB, ROT ~ 50% restricted to the R ~ 30% to the L but pain with L ROT. (from previous note)  Special tests:  (from previous note) Supine to long-sit: LLE long in lying  Scoliosis: L thoracic ? R lumbar (fusion)  Strength/MMT:  LE MMT:   Pelvic Floor External  Exam: Introitus Appears: elevated Skin integrity: WNL Palpation: TTP to B IC and STP Cough: intact Prolapse visible?: no Scar mobility: decreased on R>L  Internal Vaginal Exam: Strength (PERF): 4/5, 5 sec. Symmetry: posterior>anterior Palpation: TTP throughout (greatest at coccygeus B) Prolapse: none visualized but Pt. Reports feeling of bladder prolapse.   Abdominal:  3 finger diastasis at umbilicus, 2 above. (from prior note)  Pt. Able to engage deep-core appropriately in hook-lying and performed mini-marches with MOD to MIN cueing.  Palpation: TTP to L>R mons and  adductors.  Gait Analysis:  INTERVENTIONS THIS SESSION:  Manual: Assessed PFM and performed TP release to all internal PFM B to decrease spasm and pain and allow for improved balance of musculature for improved function and decreased symptoms.  Therex: reviewed posterior pelvic tilt in hook-lying and progressed to mini-marches to further strengthen the Deep-core and protect the spine and allow relaxation of the PFM. Educated on child's pose stretch To maintain and improve muscle length and allow for improved balance of musculature for long-term symptom relief. Educated on how to perform self external and posterior fourchette release to decrease spasm and pain and allow for improved balance of musculature for improved function and decreased symptoms.   Total time: 60                              PT Short Term Goals - 12/29/19 1426      PT SHORT TERM GOAL #1   Title Patient will demonstrate improved pelvic alignment and balance of musculature surrounding the pelvis to facilitate decreased PFM spasms and decrease pelvic pain.    Baseline lower crossed syndrome    Time 5    Period Weeks    Status New    Target Date 02/02/20      PT SHORT TERM GOAL #2   Title Patient will demonstrate HEP x1 in the clinic to demonstrate understanding and proper form to allow for further improvement.    Baseline Pt. lacks knowledge of therapeutic exercises that will help decrease her Sx.    Time 5    Period Weeks    Status New    Target Date 02/02/20      PT SHORT TERM GOAL #3   Title Patient will report consistent use of foot-stool (squatty-potty) for positioning with BM to decrease pain with BM and intra-abdominal pressure.    Baseline Chronic constipation    Time 5    Period Weeks    Status New    Target Date 02/02/20      PT SHORT TERM GOAL #4   Title Pt. will demonstrate implementation of behavioral modifications such as fluid intake and use of Urge suppression  technique to allow for decreased UUI/frequency.    Baseline Pt. urinating 9-12 times per day, having painful sensation when she needs to urinate.    Time 5    Period Weeks    Status New    Target Date 02/02/20             PT Long Term Goals - 12/29/19 1429      PT LONG TERM GOAL #1   Title Patient will report no episodes of MUI over the course of the prior two weeks to demonstrate improved functional ability.    Baseline Pt. having SUI with cough/sneeze, frequency of 9-12 times per day, and occl. urge incontinence.    Time 10    Period Weeks  Status New    Target Date 03/08/20      PT LONG TERM GOAL #2   Title Patient will describe pain no greater than 3/10 during work and when holding her 31 year old for ~ 30 min. to demonstrate improved functional ability.    Baseline Has pain as high as 8/10 with lifting her son and taking leaf blower up/down stairs.    Time 10    Period Weeks    Status New    Target Date 03/08/20      PT LONG TERM GOAL #3   Title Patient will report having BM's at least every-other day with consistency between [facility] stool scale 3-5 over the prior week to demonstrate decreased constipation.    Baseline Chronic constipation which has been worse since surgery.    Time 10    Period Weeks    Status New    Target Date 03/08/20      PT LONG TERM GOAL #4   Title Pt. will improve in FOTO score by 10 points to demonstrate improved function.    Baseline FOTO PFDI Urinary: 63, Urinary Problem: 64    Time 10    Period Weeks    Status New    Target Date 03/08/20                 Plan - 03/09/20 1053    Clinical Impression Statement Pt. Responded well to all interventions today, demonstrating improved deep-core coordination and recruitment, improved pelvic floor muscle relaxation and decreased TTP as well as understanding and correct performance of all education and exercises provided today. They will continue to benefit from skilled physical therapy to  work toward remaining goals and maximize function as well as decrease likelihood of symptom increase or recurrence.    PT Next Visit Plan Add glute strengthening, review mini-marches (and give handout). Ask about success with self release/pain with intercourse, full bladder sensation and constipation.    PT Home Exercise Plan vulvar hygiene, lumbar support/seated posture, seated pelvic tilts, hip flexor stretch at EOB, side stretch heel-lift for LLE, child's pose, mini-marches.    Consulted and Agree with Plan of Care Patient           Patient will benefit from skilled therapeutic intervention in order to improve the following deficits and impairments:     Visit Diagnosis: Abnormal posture  Muscle spasm of back  Other muscle spasm     Problem List Patient Active Problem List   Diagnosis Date Noted  . Therapeutic opioid induced constipation 11/12/2019  . Spinal stenosis of lumbar region 11/12/2019  . Pelvic pain in female 11/12/2019  . Gastroesophageal reflux disease 11/12/2019  . Pre-bariatric surgery nutrition evaluation 08/11/2019  . Spondylolysis 10/30/2018  . Chronic back pain 10/30/2018  . Thrombocytosis (HCC) 06/09/2016  . S/P primary low transverse C-section 05/25/2016  . Allergy to penicillin (rash) 05/24/2016   Cleophus Molt DPT, ATC Cleophus Molt 03/09/2020, 10:57 AM  [place] [facility] MAIN [facility] SERVICES [address] [place], [place], [address] Phone: [phone]   Fax:  [phone]  Name: Maria [name] MRN: [id number] Date of Birth: 1990/11/24

## 2020-03-16 ENCOUNTER — Ambulatory Visit: Payer: Medicaid Other

## 2020-03-16 ENCOUNTER — Other Ambulatory Visit: Payer: Self-pay

## 2020-03-16 DIAGNOSIS — R293 Abnormal posture: Secondary | ICD-10-CM | POA: Diagnosis not present

## 2020-03-16 DIAGNOSIS — M62838 Other muscle spasm: Secondary | ICD-10-CM

## 2020-03-16 DIAGNOSIS — M62830 Muscle spasm of back: Secondary | ICD-10-CM

## 2020-03-16 NOTE — Patient Instructions (Addendum)
° °  ALSO USE FOR Urinating!!!!!!     Urge supression technique:  1) Take a deep breath to convince yourself that you are in control and calm the nervous system.  2) Do 5 "quick-flick" kegels (pelvic floor muscle contractions) and re-assess the urge. Repeat another set if urge is still present. 3) Once the urge has decreased, start walking calmly to the the bathroom. Stop and repeat steps 1 and 2 as many times as needed until you can successfully get to the toilet. 4) Only once seated, take a deep breath and allow the pelvic floor muscles to relax and allow for the urine to flow.    Do not be discouraged if you are not successful the first couple times, this is normal and it will take practice but remember that YOU are in control. Start by practicing this at home where you do not have to worry as much if there were to be an accident. Allowing yourself to get rushed or nervous puts the bladder back in control and will not allow the technique to work.   Available in 32 oz, 1/2 gallon, or 1 gallon sizes, all colors...... To help you increase your water intake, try getting a reusable water bottle with markings to help you keep up with your hydration throughout the day. You should be drinking ~ 50% of your bodyweight in Oz. Of water each day or an average of ~ 64 Oz. You need at least 80-100 Oz. Of water !  You can also add fruit or cucumbers to your water to make it more interesting and desirable. Cucumbers and honeydew are good choices because they are not acidic like citrus fruit and they add a refreshing flavor.   Decrease your coffee down to 1 cup per day or 2-3 cups of Decaffeinated coffee.

## 2020-03-16 NOTE — Therapy (Signed)
[place] [facility] MAIN [facility] SERVICES [address] [place], [place], [address] Phone: [phone]   Fax:  [phone]  Physical Therapy Treatment  The patient has been informed of current processes in place at Outpatient Rehab to protect patients from Covid-19 exposure including social distancing, schedule modifications, and new cleaning procedures. After discussing their particular risk with a therapist based on the patient's personal risk factors, the patient has decided to proceed with in-person therapy.   Patient Details  Name: Maria [name] MRN: [id number] Date of Birth: 01/08/1991 No data recorded  Encounter Date: 03/16/2020   PT End of Session - 03/16/20 1441    Visit Number 6    Number of Visits 14    Date for PT Re-Evaluation 03/08/20    Authorization Type mcaid    Authorization Time Period 6/8 through 8/30 for 12 visits    Authorization - Visit Number 3    Authorization - Number of Visits 12    Progress Note Due on Visit 10    PT Start Time 0937    PT Stop Time 1030    PT Time Calculation (min) 53 min    Activity Tolerance Patient tolerated treatment well;No increased pain    Behavior During Therapy WFL for tasks assessed/performed           Past Medical History:  Diagnosis Date  . Acute pancreatitis   . Asthma   . GERD (gastroesophageal reflux disease)   . Hemorrhoid   . Hx of varicella   . Ovarian cyst   . Recurrent boils    legs and buttocks.  staff infection, was neg  . UTI (urinary tract infection)     Past Surgical History:  Procedure Laterality Date  . CESAREAN SECTION N/A 05/25/2016   Procedure: CESAREAN SECTION;  Surgeon: Gerald Leitz, MD;  Location: [facility] BIRTHING SUITES;  Service: Obstetrics;  Laterality: N/A;  . TONSILLECTOMY    . WISDOM TOOTH EXTRACTION      There were no vitals filed for this visit.    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 Her back is not that sore, mostly her legs and arms. She has been doing better with BM's but still occasionally straining some. Drinking a lot of water late in the day and having to get up to pee in the middle of the night. She has not tried intercourse yet, not sure if it will still be painful. When she urinates the stream stops and starts.   Precautions:  L4-5 fusion for spondylolysis/herniated disk (Pt. Report), Asthma, GERD, Anxiety, Insomnia.  Sexual activity/pain: Has stopped having intercourse due to pain.  Location of pain: LBP  Current pain: 0/10 Max pain: 0/10 Least pain: 0/10 Nature of pain:dull ache  Location of pain: Pelvis Current pain: 0/10  Max pain: 0/10 Least pain: 0/10 Nature of pain:sharp/burning  **no pain following treatment.  Patient Goals: Not have pain when she needs to pee or with intercourse. Not pee her pants with cough/sneeze.   OBJECTIVE  Changes in: Posture/Observations:  Unable to assess appropriately due to Pt. losing her heel-lift (from prior session)  Range of Motion/Flexibilty:  R SB ~ 4 fingers from knee, L side ~ 2 fingers from knee sore at L SIJ with  R SB, ROT ~ 50% restricted to the R ~ 30% to the L but pain with L ROT. (from previous note)  Special tests:  (from previous note) Supine to long-sit: LLE long in lying  Scoliosis: L thoracic ? R lumbar (fusion)  Strength/MMT:  LE MMT:   Pelvic Floor External Exam: (from previous visit) Introitus Appears: elevated Skin integrity: WNL Palpation: TTP to B IC and STP Cough: intact Prolapse visible?: no Scar mobility: decreased on R>L  Internal Vaginal Exam: Strength (PERF): 4/5, 5 sec. Symmetry: posterior>anterior Palpation: TTP throughout (greatest at coccygeus B) Prolapse:  none visualized but Pt. Reports feeling of bladder prolapse.   Abdominal:  3 finger diastasis at umbilicus, 2 above.  Pt. Able to engage deep-core appropriately in hook-lying and performed mini-marches with MOD to MIN cueing. (from prior note)  Palpation: TTP to L>R mons and adductors.(from prior note) Gait Analysis:  INTERVENTIONS THIS SESSION:  Self-care: reviewed and up-dated goals and educated on increasing water intake and decreasing caffiene intake to improve constipation and urinary frequency/urge. Educated on Urge suppression technique and waiting until the second urge as well as using a probiotic to help decrease pain with emptying and expand bladder filling capacity. Educated on [place] potty to decrease urinary hesitancy and constipation.    Total time: 49                             PT Short Term Goals - 03/16/20 0946      PT SHORT TERM GOAL #1   Title Patient will demonstrate improved pelvic alignment and balance of musculature surrounding the pelvis to facilitate decreased PFM spasms and decrease pelvic pain.    Baseline lower crossed syndrome As of 7/27: Pt. is only having a little bit of pain in the bladder mostly in the morning ~ 50% improved    Time 5    Period Weeks    Status On-going    Target Date 04/19/20      PT SHORT TERM GOAL #2   Title Patient will demonstrate HEP x1 in the clinic to demonstrate understanding and proper form to allow for further improvement.    Baseline Pt. lacks knowledge of therapeutic exercises that will help decrease her Sx.    Time 5    Period Weeks    Status Achieved    Target Date 02/02/20      PT SHORT TERM GOAL #3   Title Patient will report consistent use of foot-stool (squatty-potty) for positioning with BM to decrease pain with BM and intra-abdominal pressure.    Baseline Chronic constipation As of 7/27: did not need linzess and having a BM every to every-other day. Discussed today, Pt. demonstrates  desire to comply with reccommendation.    Time 5    Period Weeks    Status On-going    Target Date 04/20/11      PT SHORT TERM GOAL #4   Title Pt. will demonstrate implementation of behavioral modifications such as fluid intake and use of Urge suppression technique to allow for decreased UUI/frequency.    Baseline Pt. urinating 9-12 times per day, having painful sensation when she needs to urinate. As of 03/16/20: discussed water intake and decreasing caffiene for constipation.    Time 5    Period Weeks    Status On-going    Target Date 04/19/20             PT Long Term Goals - 03/16/20  1013      PT LONG TERM GOAL #1   Title Patient will report no episodes of MUI over the course of the prior two weeks to demonstrate improved functional ability.    Baseline Pt. having SUI with cough/sneeze, frequency of 9-12 times per day, and occl. urge incontinence. As of 7/27: Pt. having SUI only after 2nd or third sneeze,    Time 10    Period Weeks    Status New    Target Date 04/19/20      PT LONG TERM GOAL #2   Title Patient will describe pain no greater than 3/10 during work and when holding her 31 year old for ~ 30 min. to demonstrate improved functional ability.    Baseline Has pain as high as 8/10 with lifting her son and taking leaf blower up/down stairs.    Time 10    Period Weeks    Status Achieved    Target Date 03/08/20      PT LONG TERM GOAL #3   Title Patient will report having BM's at least every-other day with consistency [facility] scale 3-5 over the prior week to demonstrate decreased constipation.    Baseline Chronic constipation which has been worse since surgery. As of 03/16/20: Pt. has not used linzess in a week and has had a BM every or every-other day but still straining a little.    Time 10    Period Weeks    Status Achieved    Target Date 03/08/20      PT LONG TERM GOAL #4   Title Pt. will improve in FOTO score by 10 points to demonstrate improved  function.    Baseline FOTO PFDI Urinary: 63, Urinary Problem: 64    Time 10    Period Weeks    Status On-going    Target Date 04/19/20                 Plan - 03/16/20 1446    Clinical Impression Statement Pt. Responded well to all interventions today, demonstrating improved understanding of how her fluid intake and activity impact her bowel and bladder issues. As well as the role that a squatty potty and a probiotic can play in improving her bowel and bladder health and how to use Urge suppression to gradually increase her bladder filling capacity to decreased pain and frequency. They will continue to benefit from skilled physical therapy to work toward remaining goals and maximize function as well as decrease likelihood of symptom increase or recurrence.    PT Next Visit Plan Add glute strengthening, review mini-marches (and give handout). Ask about success with self release/pain with intercourse, full bladder sensation and constipation.    PT Home Exercise Plan vulvar hygiene, lumbar support/seated posture, seated pelvic tilts, hip flexor stretch at EOB, side stretch heel-lift for LLE, child's pose, mini-marches, water intake, decreasing coffee, squatty potty, urge suppression, probiotics.    Consulted and Agree with Plan of Care Patient           Patient will benefit from skilled therapeutic intervention in order to improve the following deficits and impairments:     Visit Diagnosis: Abnormal posture  Muscle spasm of back  Other muscle spasm     Problem List Patient Active Problem List   Diagnosis Date Noted  . Therapeutic opioid induced constipation 11/12/2019  . Spinal stenosis of lumbar region 11/12/2019  . Pelvic pain in female 11/12/2019  . Gastroesophageal reflux disease 11/12/2019  . Pre-bariatric  surgery nutrition evaluation 08/11/2019  . Spondylolysis 10/30/2018  . Chronic back pain 10/30/2018  . Thrombocytosis (HCC) 06/09/2016  . S/P primary low  transverse C-section 05/25/2016  . Allergy to penicillin (rash) 05/24/2016   Cleophus Molt DPT, ATC Cleophus Molt 03/16/2020, 2:53 PM   [facility] MAIN [facility] SERVICES [address] [place], [place], [address] Phone: [phone]   Fax:  [phone]  Name: Maria [name] MRN: [id number] Date of Birth: 09/04/1990

## 2020-03-23 ENCOUNTER — Other Ambulatory Visit: Payer: Self-pay

## 2020-03-23 ENCOUNTER — Ambulatory Visit: Payer: Medicaid Other | Attending: Urology

## 2020-03-23 DIAGNOSIS — M62838 Other muscle spasm: Secondary | ICD-10-CM | POA: Diagnosis present

## 2020-03-23 DIAGNOSIS — R293 Abnormal posture: Secondary | ICD-10-CM | POA: Diagnosis not present

## 2020-03-23 DIAGNOSIS — M62830 Muscle spasm of back: Secondary | ICD-10-CM | POA: Insufficient documentation

## 2020-03-23 NOTE — Patient Instructions (Signed)
Self Posterior Fourchette Stretching/Mobilization    1) Wash your hands and prop your body up so you can easily reach the vagina, bring hand-held mirror if desired.  2) Apply lubricant to the thumb and vaginal opening  3) Place thumb ~ 1/2 an inch into the vagina with the pad of the thumb pointed down and apply gentle pressure to the posterior fourchette.  4) Gently sweep the thumb side to side and in/out while maintaining pressure down toward the anus. Make sure the pressure is not so great that your muscles tighten up and guard, just enough to create slight discomfort.  Do this for ~ 3 min. Per night to decrease tightness and tenderness at the vaginal opening.   Keep your trunk as one unit and let it hinge forward from the hips as you push your bottom back and bend your knees at the same rate that you bend your hips. Keep your weight back toward your heels but do not actually lift the toes off the ground. Exhale starting just before and all the way through standing to help engage the glutes and lower tummy muscles.

## 2020-03-23 NOTE — Therapy (Signed)
[facility] MAIN [facility] SERVICES [address] [place], [place], [address] Phone: [phone]   Fax:  [phone]  Physical Therapy Treatment  The patient has been informed of current processes in place at Outpatient Rehab to protect patients from Covid-19 exposure including social distancing, schedule modifications, and new cleaning procedures. After discussing their particular risk with a therapist based on the patient's personal risk factors, the patient has decided to proceed with in-person therapy.  Patient Details  Name: Maria [name] MRN: [id number] Date of Birth: 1991/05/27 No data recorded  Encounter Date: 03/23/2020   PT End of Session - 03/23/20 1148    Visit Number 7    Number of Visits 14    Date for PT Re-Evaluation 03/08/20    Authorization Type mcaid    Authorization Time Period 6/8 through 8/30 for 12 visits    Authorization - Visit Number 4    Authorization - Number of Visits 12    Progress Note Due on Visit 10    PT Start Time 0930    PT Stop Time 1030    PT Time Calculation (min) 60 min    Activity Tolerance Patient tolerated treatment well;No increased pain    Behavior During Therapy WFL for tasks assessed/performed           Past Medical History:  Diagnosis Date  . Acute pancreatitis   . Asthma   . GERD (gastroesophageal reflux disease)   . Hemorrhoid   . Hx of varicella   . Ovarian cyst   . Recurrent boils    legs and buttocks.  staff infection, was neg  . UTI (urinary tract infection)     Past Surgical History:  Procedure Laterality Date  . CESAREAN SECTION N/A 05/25/2016   Procedure: CESAREAN SECTION;  Surgeon: Gerald Leitz, MD;  Location: [facility] BIRTHING SUITES;  Service: Obstetrics;  Laterality: N/A;  . TONSILLECTOMY    . WISDOM TOOTH EXTRACTION      There were no vitals filed for this visit.    Pelvic Floor Physical Therapy Treatment Note  SCREENING  Changes in medications, allergies, or  medical history?:  None    SUBJECTIVE  Patient reports: She is still having pain when urinating in the morning, it is worse some days than others. The L side has been sore for 2 days, has still had a lot more work/ working harder than usual but normal activities. Noticed that she is able to pee for longer time without PFM spasms. Has not gotten a squatty-potty yet. No leakage with coughsneeze.  Precautions:  L4-5 fusion for spondylolysis/herniated disk (Pt. Report), Asthma, GERD, Anxiety, Insomnia.  Sexual activity/pain: Has stopped having intercourse due to pain.  Location of pain: L side  Current pain: 8/10 Max pain: 9/10 Least pain: 0/10 Nature of pain:dull strong ache   **1/10 "soreness" when she side-bends to R and flexes forward following treatment.  Location of pain: Pelvis Current pain: 0/10  Max pain: 0/10 Least pain: 0/10 Nature of pain:sharp/burning  **no pain following treatment.  Patient Goals: Not have pain when she needs to pee or with intercourse. Not pee her pants with cough/sneeze.   OBJECTIVE  Changes in: Posture/Observations:  Unable to assess appropriately due to Pt. losing her heel-lift.   Range of Motion/Flexibilty:  R SB ~ 4 fingers from knee, L side ~ 2 fingers from knee sore at L SIJ with R SB, ROT ~ 50% restricted to the R ~ 30% to the L but  pain with L ROT. (from previous note)  Special tests:  (from previous note) Supine to long-sit: LLE long in lying  Scoliosis: L thoracic ? R lumbar (fusion)  Strength/MMT:  LE MMT:   Pelvic Floor External Exam: (from previous visit) Introitus Appears: elevated Skin integrity: WNL Palpation: TTP to B IC and STP Cough: intact Prolapse visible?: no Scar mobility: decreased on R>L  Internal Vaginal Exam: Strength (PERF): 4/5, 5 sec. Symmetry: posterior>anterior Palpation: TTP throughout (greatest at coccygeus B) Prolapse: none visualized but Pt. Reports feeling of bladder  prolapse.  TODAY: TTP throughout B PFM internally.  Abdominal:  3 finger diastasis at umbilicus, 2 above.  Pt. Able to engage deep-core appropriately in hook-lying and performed mini-marches with MOD to MIN cueing. (from prior note)  Palpation: TTP to L>R mons and adductors.(from prior note)  TODAY: TTP to L lumbar erector spinae and QL.  Gait Analysis:  INTERVENTIONS THIS SESSION:  Therex: reviewed side-stretch and educated on and practiced squat mechanics to prevent over-use of lumbar extensors. Re-emphasized the importance of wearing her heel-lift when working.  Manual: Performed TP release to all internal PFM B with exception of posterior fourchette and educated Pt. On how to perform self posterior fourchette release to decrease spasm and pain and allow for improved balance of musculature for improved function and decreased symptoms. Performed TP release and STM to L QL and erector spinae to decrease spasm and pain and allow for improved balance of musculature for improved function and decreased symptoms.   Dry-needle: Performed TPDN with a .30x73mm needle and standard approach as described below to decrease spasm and pain and allow for improved balance of musculature for improved function and decreased symptoms.  Total time: 60                      Trigger Point Dry Needling - 03/23/20 0001    Consent Given? Yes    Education Handout Provided No    Muscles Treated Back/Hip Erector spinae;Quadratus lumborum    Dry Needling Comments Left    Lumbar multifidi Response Twitch response elicited;Palpable increased muscle length    Quadratus Lumborum Response Twitch response elicited;Palpable increased muscle length                  PT Short Term Goals - 03/16/20 0946      PT SHORT TERM GOAL #1   Title Patient will demonstrate improved pelvic alignment and balance of musculature surrounding the pelvis to facilitate decreased PFM spasms and decrease pelvic  pain.    Baseline lower crossed syndrome As of 7/27: Pt. is only having a little bit of pain in the bladder mostly in the morning ~ 50% improved    Time 5    Period Weeks    Status On-going    Target Date 04/19/20      PT SHORT TERM GOAL #2   Title Patient will demonstrate HEP x1 in the clinic to demonstrate understanding and proper form to allow for further improvement.    Baseline Pt. lacks knowledge of therapeutic exercises that will help decrease her Sx.    Time 5    Period Weeks    Status Achieved    Target Date 02/02/20      PT SHORT TERM GOAL #3   Title Patient will report consistent use of foot-stool (squatty-potty) for positioning with BM to decrease pain with BM and intra-abdominal pressure.    Baseline Chronic constipation As of 7/27: did not need  linzess and having a BM every to every-other day. Discussed today, Pt. demonstrates desire to comply with reccommendation.    Time 5    Period Weeks    Status On-going    Target Date 04/20/11      PT SHORT TERM GOAL #4   Title Pt. will demonstrate implementation of behavioral modifications such as fluid intake and use of Urge suppression technique to allow for decreased UUI/frequency.    Baseline Pt. urinating 9-12 times per day, having painful sensation when she needs to urinate. As of 03/16/20: discussed water intake and decreasing caffiene for constipation.    Time 5    Period Weeks    Status On-going    Target Date 04/19/20             PT Long Term Goals - 03/16/20 1013      PT LONG TERM GOAL #1   Title Patient will report no episodes of MUI over the course of the prior two weeks to demonstrate improved functional ability.    Baseline Pt. having SUI with cough/sneeze, frequency of 9-12 times per day, and occl. urge incontinence. As of 7/27: Pt. having SUI only after 2nd or third sneeze,    Time 10    Period Weeks    Status New    Target Date 04/19/20      PT LONG TERM GOAL #2   Title Patient will describe pain no  greater than 3/10 during work and when holding her 31 year old for ~ 30 min. to demonstrate improved functional ability.    Baseline Has pain as high as 8/10 with lifting her son and taking leaf blower up/down stairs.    Time 10    Period Weeks    Status Achieved    Target Date 03/08/20      PT LONG TERM GOAL #3   Title Patient will report having BM's at least every-other day with [facility] scale 3-5 over the prior week to demonstrate decreased constipation.    Baseline Chronic constipation which has been worse since surgery. As of 03/16/20: Pt. has not used linzess in a week and has had a BM every or every-other day but still straining a little.    Time 10    Period Weeks    Status Achieved    Target Date 03/08/20      PT LONG TERM GOAL #4   Title Pt. will improve in FOTO score by 10 points to demonstrate improved function.    Baseline FOTO PFDI Urinary: 63, Urinary Problem: 64    Time 10    Period Weeks    Status On-going    Target Date 04/19/20                 Plan - 03/23/20 1149    Clinical Impression Statement Pt. Responded well to all interventions today, demonstrating decreased pain from 8/10 to 1/10 and decreased PFM spasm as well as understanding and correct performance of all education and exercises provided today. She needs to buy a new heel-lift as her return of pain and Sx. Is likely due to not wearing it over the last few weeks since losing it. They will continue to benefit from skilled physical therapy to work toward remaining goals and maximize function as well as decrease likelihood of symptom increase or recurrence.    PT Next Visit Plan Add glute strengthening, review mini-marches (and give handout). Ask about success with self release/pain with intercourse, full  bladder sensation and constipation.    PT Home Exercise Plan vulvar hygiene, lumbar support/seated posture, seated pelvic tilts, hip flexor stretch at EOB, side stretch heel-lift  for LLE, child's pose, mini-marches, water intake, decreasing coffee, squatty potty, urge suppression, probiotics, posterior fourchette release, squat form.    Consulted and Agree with Plan of Care Patient           Patient will benefit from skilled therapeutic intervention in order to improve the following deficits and impairments:     Visit Diagnosis: Abnormal posture  Muscle spasm of back  Other muscle spasm     Problem List Patient Active Problem List   Diagnosis Date Noted  . Therapeutic opioid induced constipation 11/12/2019  . Spinal stenosis of lumbar region 11/12/2019  . Pelvic pain in female 11/12/2019  . Gastroesophageal reflux disease 11/12/2019  . Pre-bariatric surgery nutrition evaluation 08/11/2019  . Spondylolysis 10/30/2018  . Chronic back pain 10/30/2018  . Thrombocytosis (HCC) 06/09/2016  . S/P primary low transverse C-section 05/25/2016  . Allergy to penicillin (rash) 05/24/2016   Cleophus Molt DPT, ATC Cleophus Molt 03/23/2020, 11:57 AM  [place] [facility] MAIN [facility] SERVICES [address] [place], [place], [address] Phone: [phone]   Fax:  [phone]  Name: Rajanae Mantia MRN: [id number] Date of Birth: 09-12-1990

## 2020-03-30 ENCOUNTER — Ambulatory Visit: Payer: Medicaid Other

## 2020-03-30 ENCOUNTER — Other Ambulatory Visit: Payer: Self-pay

## 2020-03-30 DIAGNOSIS — M62838 Other muscle spasm: Secondary | ICD-10-CM

## 2020-03-30 DIAGNOSIS — R293 Abnormal posture: Secondary | ICD-10-CM

## 2020-03-30 DIAGNOSIS — M62830 Muscle spasm of back: Secondary | ICD-10-CM

## 2020-03-30 NOTE — Therapy (Signed)
[facility] MAIN [facility] SERVICES [address] [place], [place], [address] Phone: [phone]   Fax:  [phone]  Physical Therapy Treatment  The patient has been informed of current processes in place at Outpatient Rehab to protect patients from Covid-19 exposure including social distancing, schedule modifications, and new cleaning procedures. After discussing their particular risk with a therapist based on the patient's personal risk factors, the patient has decided to proceed with in-person therapy.  Patient Details  Name: Maria [name] MRN: [id number] Date of Birth: May 02, 1991 No data recorded  Encounter Date: 03/30/2020   PT End of Session - 03/30/20 0951    Visit Number 8    Number of Visits 14    Date for PT Re-Evaluation 03/08/20    Authorization Type mcaid    Authorization Time Period 6/8 through 8/30 for 12 visits    Authorization - Visit Number 5    Authorization - Number of Visits 12    Progress Note Due on Visit 10    PT Start Time 0940    PT Stop Time 1040    PT Time Calculation (min) 60 min    Activity Tolerance Patient tolerated treatment well;No increased pain    Behavior During Therapy WFL for tasks assessed/performed           Past Medical History:  Diagnosis Date   Acute pancreatitis    Asthma    GERD (gastroesophageal reflux disease)    Hemorrhoid    Hx of varicella    Ovarian cyst    Recurrent boils    legs and buttocks.  staff infection, was neg   UTI (urinary tract infection)     Past Surgical History:  Procedure Laterality Date   CESAREAN SECTION N/A 05/25/2016   Procedure: CESAREAN SECTION;  Surgeon: Gerald Leitz, MD;  Location: [facility] BIRTHING SUITES;  Service: Obstetrics;  Laterality: N/A;   TONSILLECTOMY     WISDOM TOOTH EXTRACTION      There were no vitals filed for this visit.   Pelvic Floor Physical Therapy Treatment Note  SCREENING  Changes in medications, allergies, or medical  history?:  None    SUBJECTIVE  Patient reports: ~11-2 hours after last visit she had a sudden increase in pelvic pain and it lasted through the night, was severe enough to make her consider going to the ER but it was gone the next day. Still having pain when she urinates. No attempts at intercourse. Has been able to pee with less stopping-starting.  Precautions:  L4-5 fusion for spondylolysis/herniated disk (Pt. Report), Asthma, GERD, Anxiety, Insomnia.  Sexual activity/pain: Has stopped having intercourse due to pain.  Location of pain: L side  Current pain: 0/10 Max pain: 6/10 Least pain: 0/10 Nature of pain:dull strong ache   **/10 "soreness" when she side-bends to R and flexes forward following treatment.  Location of pain: Pelvis Current pain: 0/10  Max pain: 10/10 Least pain: 0/10 Nature of pain:sharp/burning  **no pain following treatment.  Patient Goals: Not have pain when she needs to pee or with intercourse. Not pee her pants with cough/sneeze.   OBJECTIVE  Changes in: Posture/Observations:  Unable to assess appropriately due to Pt. losing her heel-lift.   Range of Motion/Flexibilty:  R SB ~ 4 fingers from knee, L side ~ 2 fingers from knee sore at L SIJ with R SB, ROT ~ 50% restricted to the R ~ 30% to the L but pain with L ROT. (from previous note)  Special  tests:  (from previous note) Supine to long-sit: LLE long in lying  Scoliosis: L thoracic ? R lumbar (fusion)  Strength/MMT:  LE MMT:   Pelvic Floor External Exam: (from previous visit) Introitus Appears: elevated Skin integrity: WNL Palpation: TTP to B IC and STP Cough: intact Prolapse visible?: no Scar mobility: decreased on R>L  Internal Vaginal Exam: Strength (PERF): 4/5, 5 sec. Symmetry: posterior>anterior Palpation: TTP throughout (greatest at coccygeus B) Prolapse: none visualized but Pt. Reports feeling of bladder prolapse.  TODAY: TTP through posterior fourchette  and between superficial and deep PFM layers.  Abdominal:  3 finger diastasis at umbilicus, 2 above.  Pt. Able to engage deep-core appropriately in hook-lying and performed mini-marches with MOD to MIN cueing. (from prior note)  Palpation: TTP to L>R mons and adductors.(from prior note)   Gait Analysis:  INTERVENTIONS THIS SESSION:  Self-care: Discussed likely cause of pain was from either DOMS from releasing the PFM or the bladder spasming as a response to decreased PFM spasm "chicking" the tension there. Encouraged to use Child's pose stretch, heat, and OTC pain meds PRN as appropriate to help manage discomfort if it returns following today's release.  Manual: Performed TP release to posterior fourchette and educated  to decrease spasm and pain and allow for improved balance of musculature for improved function and decreased symptoms.    Total time: 60                               PT Short Term Goals - 03/16/20 0946      PT SHORT TERM GOAL #1   Title Patient will demonstrate improved pelvic alignment and balance of musculature surrounding the pelvis to facilitate decreased PFM spasms and decrease pelvic pain.    Baseline lower crossed syndrome As of 7/27: Pt. is only having a little bit of pain in the bladder mostly in the morning ~ 50% improved    Time 5    Period Weeks    Status On-going    Target Date 04/19/20      PT SHORT TERM GOAL #2   Title Patient will demonstrate HEP x1 in the clinic to demonstrate understanding and proper form to allow for further improvement.    Baseline Pt. lacks knowledge of therapeutic exercises that will help decrease her Sx.    Time 5    Period Weeks    Status Achieved    Target Date 02/02/20      PT SHORT TERM GOAL #3   Title Patient will report consistent use of foot-stool (squatty-potty) for positioning with BM to decrease pain with BM and intra-abdominal pressure.    Baseline Chronic constipation As of 7/27:  did not need linzess and having a BM every to every-other day. Discussed today, Pt. demonstrates desire to comply with reccommendation.    Time 5    Period Weeks    Status On-going    Target Date 04/20/11      PT SHORT TERM GOAL #4   Title Pt. will demonstrate implementation of behavioral modifications such as fluid intake and use of Urge suppression technique to allow for decreased UUI/frequency.    Baseline Pt. urinating 9-12 times per day, having painful sensation when she needs to urinate. As of 03/16/20: discussed water intake and decreasing caffiene for constipation.    Time 5    Period Weeks    Status On-going    Target Date 04/19/20  PT Long Term Goals - 03/16/20 1013      PT LONG TERM GOAL #1   Title Patient will report no episodes of MUI over the course of the prior two weeks to demonstrate improved functional ability.    Baseline Pt. having SUI with cough/sneeze, frequency of 9-12 times per day, and occl. urge incontinence. As of 7/27: Pt. having SUI only after 2nd or third sneeze,    Time 10    Period Weeks    Status New    Target Date 04/19/20      PT LONG TERM GOAL #2   Title Patient will describe pain no greater than 3/10 during work and when holding her 30 year old for ~ 30 min. to demonstrate improved functional ability.    Baseline Has pain as high as 8/10 with lifting her son and taking leaf blower up/down stairs.    Time 10    Period Weeks    Status Achieved    Target Date 03/08/20      PT LONG TERM GOAL #3   Title Patient will report having BMs at least every-other day with consistency between [facility] stool scale 3-5 over the prior week to demonstrate decreased constipation.    Baseline Chronic constipation which has been worse since surgery. As of 03/16/20: Pt. has not used linzess in a week and has had a BM every or every-other day but still straining a little.    Time 10    Period Weeks    Status Achieved    Target Date 03/08/20      PT  LONG TERM GOAL #4   Title Pt. will improve in FOTO score by 10 points to demonstrate improved function.    Baseline FOTO PFDI Urinary: 63, Urinary Problem: 64    Time 10    Period Weeks    Status On-going    Target Date 04/19/20                 Plan - 03/30/20 1049    Clinical Impression Statement Pt. Responded well to all interventions today, demonstrating decreased PFM spasms and scar-tissue restriction through the posterior fourchette as well as understanding and correct performance of all education and exercises provided today. They will continue to benefit from skilled physical therapy to work toward remaining goals and maximize function as well as decrease likelihood of symptom increase or recurrence.    PT Next Visit Plan Add glute strengthening, review mini-marches (and give handout). Ask about success with self release/pain with intercourse, full bladder sensation and constipation.    PT Home Exercise Plan vulvar hygiene, lumbar support/seated posture, seated pelvic tilts, hip flexor stretch at EOB, side stretch heel-lift for LLE, child's pose, mini-marches, water intake, decreasing coffee, squatty potty, urge suppression, probiotics, posterior fourchette release, squat form.    Consulted and Agree with Plan of Care Patient           Patient will benefit from skilled therapeutic intervention in order to improve the following deficits and impairments:     Visit Diagnosis: Abnormal posture  Muscle spasm of back  Other muscle spasm     Problem List Patient Active Problem List   Diagnosis Date Noted   Therapeutic opioid induced constipation 11/12/2019   Spinal stenosis of lumbar region 11/12/2019   Pelvic pain in female 11/12/2019   Gastroesophageal reflux disease 11/12/2019   Pre-bariatric surgery nutrition evaluation 08/11/2019   Spondylolysis 10/30/2018   Chronic back pain 10/30/2018   Thrombocytosis (HCC) 06/09/2016  S/P primary low transverse  C-section 05/25/2016   Allergy to penicillin (rash) 05/24/2016   Cleophus Molt DPT, ATC Cleophus Molt 03/30/2020, 10:55 AM  [place] [facility] MAIN [facility] SERVICES [address] [place], [place], [address] Phone: [phone]   Fax:  [phone]  Name: Maria [name] MRN: [id number] Date of Birth: 06-01-1991

## 2020-04-05 ENCOUNTER — Ambulatory Visit: Payer: Medicaid Other

## 2020-04-07 ENCOUNTER — Emergency Department: Payer: Medicaid Other

## 2020-04-07 ENCOUNTER — Other Ambulatory Visit: Payer: Self-pay

## 2020-04-07 ENCOUNTER — Emergency Department
Admission: EM | Admit: 2020-04-07 | Discharge: 2020-04-07 | Disposition: A | Discharge status: Home / Self Care | Payer: Medicaid Other | Attending: Emergency Medicine | Admitting: Emergency Medicine

## 2020-04-07 ENCOUNTER — Encounter: Payer: Self-pay | Admitting: Emergency Medicine

## 2020-04-07 DIAGNOSIS — R102 Pelvic and perineal pain: Secondary | ICD-10-CM | POA: Diagnosis present

## 2020-04-07 DIAGNOSIS — Z20822 Contact with and (suspected) exposure to covid-19: Secondary | ICD-10-CM | POA: Insufficient documentation

## 2020-04-07 DIAGNOSIS — J45909 Unspecified asthma, uncomplicated: Secondary | ICD-10-CM | POA: Diagnosis not present

## 2020-04-07 DIAGNOSIS — Z87891 Personal history of nicotine dependence: Secondary | ICD-10-CM | POA: Insufficient documentation

## 2020-04-07 DIAGNOSIS — R1031 Right lower quadrant pain: Secondary | ICD-10-CM | POA: Insufficient documentation

## 2020-04-07 LAB — COMPREHENSIVE METABOLIC PANEL
ALT: 118 U/L — ABNORMAL HIGH (ref 0–44)
AST: 43 U/L — ABNORMAL HIGH (ref 15–41)
Albumin: 4 g/dL (ref 3.5–5.0)
Alkaline Phosphatase: 72 U/L (ref 38–126)
Anion gap: 11 (ref 5–15)
BUN: 10 mg/dL (ref 6–20)
CO2: 24 mmol/L (ref 22–32)
Calcium: 9.1 mg/dL (ref 8.9–10.3)
Chloride: 103 mmol/L (ref 98–111)
Creatinine, Ser: 0.4 mg/dL — ABNORMAL LOW (ref 0.44–1.00)
GFR calc Af Amer: >60 mL/min (ref >60)
GFR calc non Af Amer: >60 mL/min (ref >60)
Glucose, Bld: 106 mg/dL — ABNORMAL HIGH (ref 70–99)
Potassium: 4.2 mmol/L (ref 3.5–5.1)
Sodium: 138 mmol/L (ref 135–145)
Total Bilirubin: 0.8 mg/dL (ref 0.3–1.2)
Total Protein: 6.7 g/dL (ref 6.5–8.1)

## 2020-04-07 LAB — CBC WITH DIFFERENTIAL/PLATELET
Abs Immature Granulocytes: 0.01 10*3/uL (ref 0.00–0.07)
Basophils Absolute: 0 10*3/uL (ref 0.0–0.1)
Basophils Relative: 0 %
Eosinophils Absolute: 0 10*3/uL (ref 0.0–0.5)
Eosinophils Relative: 1 %
HCT: 43.1 % (ref 36.0–46.0)
Hemoglobin: 14.4 g/dL (ref 12.0–15.0)
Immature Granulocytes: 0 %
Lymphocytes Relative: 30 %
Lymphs Abs: 1.4 10*3/uL (ref 0.7–4.0)
MCH: 31.4 pg (ref 26.0–34.0)
MCHC: 33.4 g/dL (ref 30.0–36.0)
MCV: 93.9 fL (ref 80.0–100.0)
Monocytes Absolute: 0.4 10*3/uL (ref 0.1–1.0)
Monocytes Relative: 8 %
Neutro Abs: 2.9 10*3/uL (ref 1.7–7.7)
Neutrophils Relative %: 61 %
Platelets: 194 10*3/uL (ref 150–400)
RBC: 4.59 MIL/uL (ref 3.87–5.11)
RDW: 13 % (ref 11.5–15.5)
WBC: 4.8 10*3/uL (ref 4.0–10.5)
nRBC: 0 % (ref 0.0–0.2)

## 2020-04-07 LAB — URINE DRUG SCREEN, QUALITATIVE (ARMC ONLY)
Amphetamines, Ur Screen: NOT DETECTED
Barbiturates, Ur Screen: NOT DETECTED
Benzodiazepine, Ur Scrn: NOT DETECTED
Cannabinoid 50 Ng, Ur ~~LOC~~: NOT DETECTED
Cocaine Metabolite,Ur ~~LOC~~: NOT DETECTED
MDMA (Ecstasy)Ur Screen: NOT DETECTED
Methadone Scn, Ur: NOT DETECTED
Opiate, Ur Screen: NOT DETECTED
Phencyclidine (PCP) Ur S: NOT DETECTED
Tricyclic, Ur Screen: NOT DETECTED

## 2020-04-07 LAB — URINALYSIS, COMPLETE (UACMP) WITH MICROSCOPIC
Bacteria, UA: NONE SEEN
Bilirubin Urine: NEGATIVE
Glucose, UA: NEGATIVE mg/dL
Hgb urine dipstick: NEGATIVE
Ketones, ur: NEGATIVE mg/dL
Leukocytes,Ua: NEGATIVE
Nitrite: NEGATIVE
Protein, ur: NEGATIVE mg/dL
Specific Gravity, Urine: 1.012 (ref 1.005–1.030)
pH: 5 (ref 5.0–8.0)

## 2020-04-07 LAB — PREGNANCY, URINE: Preg Test, Ur: NEGATIVE

## 2020-04-07 LAB — SARS CORONAVIRUS 2 BY RT PCR (HOSPITAL ORDER, PERFORMED IN ~~LOC~~ HOSPITAL LAB): SARS Coronavirus 2: NEGATIVE

## 2020-04-07 LAB — POCT PREGNANCY, URINE: Preg Test, Ur: NEGATIVE

## 2020-04-07 LAB — LIPASE, BLOOD: Lipase: 21 U/L (ref 11–51)

## 2020-04-07 MED ORDER — IOHEXOL 300 MG/ML  SOLN
100.0000 mL | Freq: Once | INTRAMUSCULAR | Status: AC | PRN
  Administered 2020-04-07: 100 mL via INTRAVENOUS
  Filled 2020-04-07: qty 100

## 2020-04-07 MED ORDER — ONDANSETRON 4 MG PO TBDP: 4.0000 mg | ORAL_TABLET | Freq: Three times a day (TID) | ORAL | 0 refills | Status: AC | PRN

## 2020-04-07 MED ORDER — TRAMADOL HCL 50 MG PO TABS: 50.0000 mg | ORAL_TABLET | Freq: Four times a day (QID) | ORAL | 0 refills | Status: AC | PRN

## 2020-04-07 NOTE — ED Triage Notes (Signed)
Patient presents to the ED with right lower abdominal pain that she describes as sharp and burning.  Pain radiates into her right hip.  Patient reports tenderness with sitting or bumps on the wheelchair.  Patient reports bloating to abdomen.  Patient denies vomiting and diarrhea.  Patient reports some constipation but states that's normal for her.

## 2020-04-07 NOTE — ED Provider Notes (Signed)
Emergency Department Provider Note  ____________________________________________  Time seen: Approximately 4:35 PM  I have reviewed the triage vital signs and the nursing notes.   HISTORY  Chief Complaint Abdominal Pain   Historian Patient     HPI Maria [name] is a 29 y.o. female the history of pancreatitis, GERD and ovarian cyst, presents to the emergency department with burning and sharp, constant, 8 out of 10 right lower quadrant abdominal pain.  Patient states that abdominal pain started this morning and reminds her of the pain she experienced when she had pancreatitis in the past.  She endorses nausea but no vomiting.  No diarrhea.  She denies fever and chills at home.  No associated rhinorrhea, nasal congestion or nonproductive cough.  She denies recent travel or abdominal trauma.  No dysuria, hematuria or increased urinary frequency.  She does state that she has some right-sided low back pain that is nonradiating in nature.  She has tried Tylenol at home but no other alleviating measures.   Past Medical History:  Diagnosis Date  . Acute pancreatitis   . Asthma   . GERD (gastroesophageal reflux disease)   . Hemorrhoid   . Hx of varicella   . Ovarian cyst   . Recurrent boils    legs and buttocks.  staff infection, was neg  . UTI (urinary tract infection)      Immunizations up to date:  Yes.     Past Medical History:  Diagnosis Date  . Acute pancreatitis   . Asthma   . GERD (gastroesophageal reflux disease)   . Hemorrhoid   . Hx of varicella   . Ovarian cyst   . Recurrent boils    legs and buttocks.  staff infection, was neg  . UTI (urinary tract infection)     Patient Active Problem List   Diagnosis Date Noted  . Therapeutic opioid induced constipation 11/12/2019  . Spinal stenosis of lumbar region 11/12/2019  . Pelvic pain in female 11/12/2019  . Gastroesophageal reflux disease 11/12/2019  . Pre-bariatric surgery nutrition evaluation  08/11/2019  . Spondylolysis 10/30/2018  . Chronic back pain 10/30/2018  . Thrombocytosis (HCC) 06/09/2016  . S/P primary low transverse C-section 05/25/2016  . Allergy to penicillin (rash) 05/24/2016    Past Surgical History:  Procedure Laterality Date  . CESAREAN SECTION N/A 05/25/2016   Procedure: CESAREAN SECTION;  Surgeon: Gerald Leitz, MD;  Location: [facility] BIRTHING SUIT[facility];  Service: Obstetrics;  Laterality: N/A;  . TONSILLECTOMY    . WISDOM TOOTH EXTRACTION      Prior to Admission medications   Medication Sig Start Date End Date Taking? Authorizing Provider  amitriptyline (ELAVIL) 10 MG tablet Take 10 mg by mouth at bedtime.    [provider]  gabapentin (NEURONTIN) 400 MG capsule Take 400 mg by mouth 3 (three) times daily.    [provider]  linaclotide (LINZESS) 72 MCG capsule Take 72 mcg by mouth daily before breakfast.    [provider]  medroxyPROGESTERone (DEPO-PROVERA) 150 MG/ML injection Inject 150 mg into the muscle every 3 (three) months.    [provider]  meloxicam (MOBIC) 7.5 MG tablet Take 7.5 mg by mouth daily.     [provider]  methocarbamol (ROBAXIN) 500 MG tablet Take 500 mg by mouth 4 (four) times daily.    [provider]  ondansetron (ZOFRAN ODT) 4 MG disintegrating tablet Take 1 tablet (4 mg total) by mouth every 8 (eight) hours as needed for up to 5 days  for nausea or vomiting. 04/07/20 04/12/20  Orvil Feil, PA-C  oxyCODONE-acetaminophen (PERCOCET/ROXICET) 5-325 MG tablet Take by mouth every 4 (four) hours as needed for severe pain.    [provider]  traMADol (ULTRAM) 50 MG tablet Take 1 tablet (50 mg total) by mouth every 6 (six) hours as needed for up to 3 days. 04/07/20 04/10/20  Orvil Feil, PA-C  traZODone (DESYREL) 50 MG tablet Take 50 mg by mouth at bedtime.    [provider]    Allergies Penicillins  Family History  Problem Relation Age of Onset  . Hypertension  Maternal Grandmother   . Heart disease Maternal Grandfather   . Stroke Maternal Grandfather   . Diabetes Neg Hx   . Cancer Neg Hx     Social History Social History   Tobacco Use  . Smoking status: Former Smoker    Packs/day: 0.50    Types: Cigarettes    Quit date: 11/07/2013    Years since quitting: 6.4  . Smokeless tobacco: Former Engineer, water Use Topics  . Alcohol use: Yes    Alcohol/week: 1.0 standard drink    Types: 1 Cans of beer per week    Comment: occasional not while preg  . Drug use: No     Review of Systems  Constitutional: No fever/chills Eyes:  No discharge ENT: No upper respiratory complaints. Respiratory: no cough. No SOB/ use of accessory muscles to breath Gastrointestinal: Patient has right lower quadrant abdominal pain.  Musculoskeletal: Negative for musculoskeletal pain. Skin: Negative for rash, abrasions, lacerations, ecchymosis.    ____________________________________________   PHYSICAL EXAM:  VITAL SIGNS: ED Triage Vitals  Enc Vitals Group     BP 04/07/20 1343 (!) 119/95     Pulse Rate 04/07/20 1343 84     Resp 04/07/20 1343 18     Temp 04/07/20 1343 98.5 F (36.9 C)     Temp Source 04/07/20 1343 Oral     SpO2 04/07/20 1343 100 %     Weight 04/07/20 1344 154 lb (69.9 kg)     Height 04/07/20 1344 5\' 2"  (1.575 m)     Head Circumference --      Peak Flow --      Pain Score 04/07/20 1350 10     Pain Loc --      Pain Edu? --      Excl. in GC? --      Constitutional: Alert and oriented. Well appearing and in no acute distress. Eyes: Conjunctivae are normal. PERRL. EOMI. Head: Atraumatic. Cardiovascular: Normal rate, regular rhythm. Normal S1 and S2.  Good peripheral circulation. Respiratory: Normal respiratory effort without tachypnea or retractions. Lungs CTAB. Good air entry to the bases with no decreased or absent breath sounds Gastrointestinal: Bowel sounds x 4 quadrants.  Patient has tenderness to palpation right lower  quadrant. No guarding or rigidity. No distention. Musculoskeletal: Full range of motion to all extremities. No obvious deformities noted Neurologic:  Normal for age. No gross focal neurologic deficits are appreciated.  Skin:  Skin is warm, dry and intact. No rash noted. Psychiatric: Mood and affect are normal for age. Speech and behavior are normal.   ____________________________________________   LABS (all labs ordered are listed, but only abnormal results are displayed)  Labs Reviewed  URINALYSIS, COMPLETE (UACMP) WITH MICROSCOPIC - Abnormal; Notable for the following components:      Result Value   Color, Urine YELLOW (*)    APPearance HAZY (*)    All  other components within normal limits  COMPREHENSIVE METABOLIC PANEL - Abnormal; Notable for the following components:   Glucose, Bld 106 (*)    Creatinine, Ser 0.40 (*)    AST 43 (*)    ALT 118 (*)    All other components within normal limits  SARS CORONAVIRUS 2 BY RT PCR (HOSPITAL ORDER, PERFORMED IN [facility])  PREGNANCY, URINE  URINE DRUG SCREEN, QUALITATIVE ([facility] ONLY)  LIPASE, BLOOD  CBC WITH DIFFERENTIAL/PLATELET  POCT PREGNANCY, URINE   ____________________________________________  EKG   ____________________________________________  RADIOLOGY Geraldo Pitter, personally viewed and evaluated these images (plain radiographs) as part of my medical decision making, as well as reviewing the written report by the radiologist.  CT ABDOMEN PELVIS W CONTRAST  Result Date: 04/07/2020 CLINICAL DATA:  29 year old female with abdominal pain. Concern for abscess or infection. EXAM: CT ABDOMEN AND PELVIS WITH CONTRAST TECHNIQUE: Multidetector CT imaging of the abdomen and pelvis was performed using the standard protocol following bolus administration of intravenous contrast. CONTRAST:  OMNIPAQUE IOHEXOL 300 MG/ML  SOLN COMPARISON:  CT abdomen pelvis dated 12/26/2016. FINDINGS: Lower chest: The visualized lung  bases are clear. No intra-abdominal free air.  Trace free fluid in the pelvis. Hepatobiliary: No focal liver abnormality is seen. No gallstones, gallbladder wall thickening, or biliary dilatation. Pancreas: Unremarkable. No pancreatic ductal dilatation or surrounding inflammatory changes. Spleen: Normal in size without focal abnormality. Adrenals/Urinary Tract: The adrenal glands unremarkable. There is no hydronephrosis on either side. The visualized ureters and urinary bladder appear unremarkable. Stomach/Bowel: There is no bowel obstruction or active inflammation. The appendix is normal. Vascular/Lymphatic: The abdominal aorta and IVC are unremarkable. No portal venous gas. There is no adenopathy. Reproductive: The uterus is anteverted and appears unremarkable. No adnexal masses. Other: There is a 1 cm focus of fat with a rim of peripheral inflammation adjacent to the anterior uterine fundus (70/2 and sagittal 57/6) concerning for a focus of inflammation, possibly torsed fat. There is no drainable fluid collection or abscess. Anterior pelvic wall C-section scar noted. There is diastasis of anterior abdominal wall musculature in the midline with a small fat containing umbilical hernia. Musculoskeletal: L4-L5 disc spacer and posterior fusion. No acute osseous pathology. IMPRESSION: 1. Small focus of inflammation adjacent to the anterior uterine fundus, possibly torsed fat. No drainable fluid collection or abscess. 2. No bowel obstruction. Normal appendix. Electronically Signed   By: Elgie Collard M.D.   On: 04/07/2020 17:30   DG Abdomen Acute W/Chest  Result Date: 04/07/2020 CLINICAL DATA:  Abdominal pain EXAM: DG ABDOMEN ACUTE W/ 1V CHEST COMPARISON:  CT abdomen and pelvis Dec 26, 2016; October 25, 2009 FINDINGS: PA chest: Lungs are clear. Heart size and pulmonary vascularity are normal. No adenopathy. Supine and upright images obtained. There is moderate stool in the colon. There is no bowel dilatation or  air-fluid level to suggest bowel obstruction. No free air. Postoperative changes are noted at L4 and L5 in the lumbar region. IMPRESSION: Moderate stool in colon. No bowel obstruction or free air. Lungs clear. Electronically Signed   By: Bretta Bang III M.D.   On: 04/07/2020 14:28   US PELVIC COMPLETE W TRANSVAGINAL AND TORSION R/O  Result Date: 04/07/2020 CLINICAL DATA:  Pelvic pain x2 days. EXAM: TRANSABDOMINAL AND TRANSVAGINAL ULTRASOUND OF PELVIS DOPPLER ULTRASOUND OF OVARIES TECHNIQUE: Both transabdominal and transvaginal ultrasound examinations of the pelvis were performed. Transabdominal technique was performed for global imaging of the pelvis including uterus, ovaries, adnexal regions,  and pelvic cul-de-sac. It was necessary to proceed with endovaginal exam following the transabdominal exam to visualize the endometrium and bilateral ovaries. Color and duplex Doppler ultrasound was utilized to evaluate blood flow to the ovaries. COMPARISON:  None. FINDINGS: Uterus Measurements: 6.9 cm x 2.2 cm x 3.3 cm = volume: 26 mL. No fibroids or other mass visualized. Endometrium Thickness: 2.0 mm.  No focal abnormality visualized. Right ovary Measurements: 2.4 cm x 1.5 cm x 1.4 cm = volume: 2.66 mL. Normal appearance/no adnexal mass. Left ovary Measurements: 3.0 cm x 1.7 cm x 1.8 cm = volume: 4.87 mL. Normal appearance/no adnexal mass. Pulsed Doppler evaluation of both ovaries demonstrates normal low-resistance arterial and venous waveforms. Other findings No abnormal free fluid. IMPRESSION: Normal pelvic ultrasound. Electronically Signed   By: Aram Candelahaddeus  Houston M.D.   On: 04/07/2020 19:04    ____________________________________________    PROCEDURES  Procedure(s) performed:     Procedures     Medications  iohexol (OMNIPAQUE) 300 MG/ML solution 100 mL (100 mLs Intravenous Contrast Given 04/07/20 1659)     ____________________________________________   INITIAL IMPRESSION / ASSESSMENT  AND PLAN / ED COURSE  Pertinent labs & imaging results that were available during my care of the patient were reviewed by me and considered in my medical decision making (see chart for details).      Assessment and Plan:  Right lower quadrant abdominal pain:  29 year old female presents to the emergency department with right lower quadrant abdominal pain over the past 2 days.  Vital signs were reassuring at triage.  On physical exam, abdomen was soft and nontender without guarding.   Differential diagnosis includes ovarian cyst, ovarian torsion, appendicitis, cystitis...  There was no leukocytosis on CBC.  CMP revealed elevated AST and ALT but was otherwise unremarkable.  Lipase was within reference range.  COVID-19 testing was negative.  Urinalysis was noncontributory for cystitis.  No evidence of appendicitis or other acute abnormality on CT abdomen pelvis.  No evidence of ovarian torsion on pelvic ultrasound.  Patient was discharged with a short course of tramadol for pain.  Return precautions were given to return with new or worsening symptoms.    ____________________________________________  FINAL CLINICAL IMPRESSION(S) / ED DIAGNOSES  Final diagnoses:  Pelvic pain  Right lower quadrant abdominal pain      NEW MEDICATIONS STARTED DURING THIS VISIT:  ED Discharge Orders         Ordered    traMADol (ULTRAM) 50 MG tablet  Every 6 hours PRN     Discontinue  Reprint     04/07/20 1917    ondansetron (ZOFRAN ODT) 4 MG disintegrating tablet  Every 8 hours PRN     Discontinue  Reprint     04/07/20 1917              This chart was dictated using voice recognition software/Dragon. Despite best efforts to proofread, errors can occur which can change the meaning. Any change was purely unintentional.     Gasper LloydWoods, Amy Gothard M, PA-C 04/07/20 2033    Chesley NoonJessup, Charles, MD 04/07/20 [phone]

## 2020-04-07 NOTE — ED Notes (Signed)
See triage note  Presents with abd pain  States pain is mainly lower and moves into leg on right  Denies any n/v or fever

## 2020-04-07 NOTE — Discharge Instructions (Signed)
Take Tramadol and zofran for pain and nausea.

## 2020-04-12 ENCOUNTER — Ambulatory Visit: Payer: Medicaid Other

## 2020-04-22 ENCOUNTER — Ambulatory Visit: Payer: Medicaid Other | Attending: Urology

## 2020-04-22 ENCOUNTER — Other Ambulatory Visit: Payer: Self-pay

## 2020-04-22 DIAGNOSIS — M62838 Other muscle spasm: Secondary | ICD-10-CM | POA: Insufficient documentation

## 2020-04-22 DIAGNOSIS — M62830 Muscle spasm of back: Secondary | ICD-10-CM

## 2020-04-22 DIAGNOSIS — R293 Abnormal posture: Secondary | ICD-10-CM | POA: Diagnosis present

## 2020-04-22 NOTE — Patient Instructions (Signed)
  Pelvic Rotation: Contract / Relax (Supine)  MET to Correct Right Anteriorly Rotated/Left Posteriorly Rotated Innominate   Begin laying on your back with your feet at 90 degrees. Put a dowel/broomstick  through your legs, behind your right knee and in front of your left knee. Stabilize the dowel on ether side with your hands.  Press down with the right leg and up with the left leg. Hold for 5 seconds  then slowly relax. Repeat 5 times.    

## 2020-04-22 NOTE — Therapy (Signed)
[place] MAIN [facility] SERVICES [address] [place], [place], [address] Phone: [phone]   Fax:  [phone]  Physical Therapy Treatment  The patient has been informed of current processes in place at Outpatient Rehab to protect patients from Covid-19 exposure including social distancing, schedule modifications, and new cleaning procedures. After discussing their particular risk with a therapist based on the patient's personal risk factors, the patient has decided to proceed with in-person therapy.   Patient Details  Name: Maria [name] MRN: [id number] Date of Birth: [birth year] No data recorded  Encounter Date: 04/22/2020   PT End of Session - 04/22/20 1029    Visit Number 9    Number of Visits 14    Date for PT Re-Evaluation 03/08/20    Authorization Type mcaid    Authorization Time Period auth good through 9/28    Authorization - Visit Number 6    Authorization - Number of Visits 12    Progress Note Due on Visit 10    PT Start Time 0930    PT Stop Time 1030    PT Time Calculation (min) 60 min    Activity Tolerance Patient tolerated treatment well;No increased pain    Behavior During Therapy WFL for tasks assessed/performed           Past Medical History:  Diagnosis Date  . Acute pancreatitis   . Asthma   . GERD (gastroesophageal reflux disease)   . Hemorrhoid   . Hx of varicella   . Ovarian cyst   . Recurrent boils    legs and buttocks.  staff infection, was neg  . UTI (urinary tract infection)     Past Surgical History:  Procedure Laterality Date  . CESAREAN SECTION N/A 05/25/2016   Procedure: CESAREAN SECTION;  Surgeon: Christophe Louis, MD;  Location: [place];  Service: Obstetrics;  Laterality: N/A;  . TONSILLECTOMY    . WISDOM TOOTH EXTRACTION      There were no vitals filed for this visit.    Pelvic Floor Physical Therapy Treatment Note  SCREENING  Changes in medications, allergies, or medical  history?:  None    SUBJECTIVE  Patient reports: She got a new job, will not have to be outside or walking miles and miles. Sharp pain is "still there" but not terrible, it is more tolerable.    Precautions:  L4-5 fusion for spondylolysis/herniated disk (Pt. Report), Asthma, GERD, Anxiety, Insomnia.  Sexual activity/pain: Has stopped having intercourse due to pain.  Location of pain: L side  Current pain: 5/10 Max pain: 8/10 Least pain: 0/10 Nature of pain:dull strong ache   **no pain following treatment  Location of pain: Pelvis Current pain: 5/10  Max pain: 10/10 Least pain: 0/10 Nature of pain:sharp/burning  **no pain following treatment.  Patient Goals: Not have pain when she needs to pee or with intercourse. Not pee her pants with cough/sneeze.   OBJECTIVE  Changes in: Posture/Observations:  R anterior rotation   **following treatment R PSIS still slightly high in standing   Range of Motion/Flexibilty:  R SB ~ 4 fingers from knee, L side ~ 2 fingers from knee sore at L SIJ with R SB, ROT ~ 50% restricted to the R ~ 30% to the L but pain with L ROT. (from previous note)  Special tests:  (from previous note) Supine to long-sit: LLE long in lying  Scoliosis: L thoracic ? R lumbar (fusion)  Supine-to-long-sit: LLE long in both following MET correction.  Strength/MMT:  LE MMT:   Pelvic Floor External Exam: (from previous visit) Introitus Appears: elevated Skin integrity: WNL Palpation: TTP to B IC and STP Cough: intact Prolapse visible?: no Scar mobility: decreased on R>L  Internal Vaginal Exam: Strength (PERF): 4/5, 5 sec. Symmetry: posterior>anterior Palpation: TTP throughout (greatest at coccygeus B) Prolapse: none visualized but Pt. Reports feeling of bladder prolapse.  TODAY: TTP to B OI palpated externally  Abdominal:  3 finger diastasis at umbilicus, 2 above.  Pt. Able to engage deep-core appropriately in hook-lying and  performed mini-marches with MOD to MIN cueing. (from prior note)  Palpation: TTP to L>R mons and adductors.(from prior note)   Gait Analysis:  INTERVENTIONS THIS SESSION:  Therex: Educated on and practiced MET correction for R anterior rotation and reviewed side-stretch and hip-flexor stretch inportatnce to continue to improve pelvic alignment and decrease/maintain decreased pain.  Manual: Performed TP release to R Psoas, lumbar multifidus, and L QL and MET correction x3 for R anterior rotation to decrease spasm and pain and allow for improved balance of musculature and pelvic alignment for improved function and decreased symptoms.   Total time: 60                             PT Short Term Goals - 03/16/20 0946      PT SHORT TERM GOAL #1   Title Patient will demonstrate improved pelvic alignment and balance of musculature surrounding the pelvis to facilitate decreased PFM spasms and decrease pelvic pain.    Baseline lower crossed syndrome As of 7/27: Pt. is only having a little bit of pain in the bladder mostly in the morning ~ 50% improved    Time 5    Period Weeks    Status On-going    Target Date 04/19/20      PT SHORT TERM GOAL #2   Title Patient will demonstrate HEP x1 in the clinic to demonstrate understanding and proper form to allow for further improvement.    Baseline Pt. lacks knowledge of therapeutic exercises that will help decrease her Sx.    Time 5    Period Weeks    Status Achieved    Target Date 02/02/20      PT SHORT TERM GOAL #3   Title Patient will report consistent use of foot-stool (squatty-potty) for positioning with BM to decrease pain with BM and intra-abdominal pressure.    Baseline Chronic constipation As of 7/27: did not need linzess and having a BM every to every-other day. Discussed today, Pt. demonstrates desire to comply with reccommendation.    Time 5    Period Weeks    Status On-going    Target Date 04/20/11      PT  SHORT TERM GOAL #4   Title Pt. will demonstrate implementation of behavioral modifications such as fluid intake and use of Urge suppression technique to allow for decreased UUI/frequency.    Baseline Pt. urinating 9-12 times per day, having painful sensation when she needs to urinate. As of 03/16/20: discussed water intake and decreasing caffiene for constipation.    Time 5    Period Weeks    Status On-going    Target Date 04/19/20             PT Long Term Goals - 03/16/20 1013      PT LONG TERM GOAL #1   Title Patient will report no episodes of MUI over the course of the  prior two weeks to demonstrate improved functional ability.    Baseline Pt. having SUI with cough/sneeze, frequency of 9-12 times per day, and occl. urge incontinence. As of 7/27: Pt. having SUI only after 2nd or third sneeze,    Time 10    Period Weeks    Status New    Target Date 04/19/20      PT LONG TERM GOAL #2   Title Patient will describe pain no greater than 3/10 during work and when holding her 32 year old for ~ 30 min. to demonstrate improved functional ability.    Baseline Has pain as high as 8/10 with lifting her son and taking leaf blower up/down stairs.    Time 10    Period Weeks    Status Achieved    Target Date 03/08/20      PT LONG TERM GOAL #3   Title Patient will report having BM's at least every-other day with consistency between [facility] stool scale 3-5 over the prior week to demonstrate decreased constipation.    Baseline Chronic constipation which has been worse since surgery. As of 03/16/20: Pt. has not used linzess in a week and has had a BM every or every-other day but still straining a little.    Time 10    Period Weeks    Status Achieved    Target Date 03/08/20      PT LONG TERM GOAL #4   Title Pt. will improve in FOTO score by 10 points to demonstrate improved function.    Baseline FOTO PFDI Urinary: 63, Urinary Problem: 64    Time 10    Period Weeks    Status On-going    Target  Date 04/19/20                 Plan - 04/22/20 1218    Clinical Impression Statement Pt. Responded well to all interventions today, demonstrating improved pelvic alignment, decreased pain from 5/10 to 0/10, decreased spasm, as well as understanding and correct performance of all education and exercises provided today. They will continue to benefit from skilled physical therapy to work toward remaining goals and maximize function as well as decrease likelihood of symptom increase or recurrence.    PT Next Visit Plan Add glute strengthening, review mini-marches (and give handout). Ask about success with self release/pain with intercourse, full bladder sensation and constipation.    PT Home Exercise Plan vulvar hygiene, lumbar support/seated posture, seated pelvic tilts, hip flexor stretch at EOB, side stretch heel-lift for LLE, child's pose, mini-marches, water intake, decreasing coffee, squatty potty, urge suppression, probiotics, posterior fourchette release, squat form. MET correction for R anterior rotation.    Consulted and Agree with Plan of Care Patient           Patient will benefit from skilled therapeutic intervention in order to improve the following deficits and impairments:     Visit Diagnosis: Abnormal posture  Muscle spasm of back  Other muscle spasm     Problem List Patient Active Problem List   Diagnosis Date Noted  . Therapeutic opioid induced constipation 11/12/2019  . Spinal stenosis of lumbar region 11/12/2019  . Pelvic pain in female 11/12/2019  . Gastroesophageal reflux disease 11/12/2019  . Pre-bariatric surgery nutrition evaluation 08/11/2019  . Spondylolysis 10/30/2018  . Chronic back pain 10/30/2018  . Thrombocytosis ([place]) 06/09/2016  . S/P primary low transverse C-section 05/25/2016  . Allergy to penicillin (rash) 05/24/2016   Willa Rough DPT, ATC Willa Rough  04/22/2020, 12:20 PM  [place] MAIN [facility]  SERVICES [address] [place], [place], [address] Phone: [phone]   Fax:  [phone]  Name: Maria [name] MRN: [id number] Date of Birth: [birth year]

## 2020-04-29 ENCOUNTER — Other Ambulatory Visit: Payer: Self-pay

## 2020-04-29 ENCOUNTER — Ambulatory Visit: Payer: Medicaid Other

## 2020-04-29 DIAGNOSIS — M62838 Other muscle spasm: Secondary | ICD-10-CM

## 2020-04-29 DIAGNOSIS — R293 Abnormal posture: Secondary | ICD-10-CM

## 2020-04-29 DIAGNOSIS — M62830 Muscle spasm of back: Secondary | ICD-10-CM

## 2020-04-29 NOTE — Patient Instructions (Signed)
COVID-19 Vaccine Information can be found at: [email or web address] For questions related to vaccine distribution or appointments, please email [email or web address] or call [phone].   Also look for videos from Dr. Darlin Priestly on Youtube to help understand the risks and benefits of Covid vaccination.  Mini-Marches    Exhale, drawing the lower tummy (TA) in toward the back bone and hold contraction while you lift one foot ~ 2 inches off the mat, then the other foot before relaxing and resetting. Try to keep your hips from rocking, using your hands to sense whether they are staying even as pictured.      Perform _15__ repetitions for _2__ sets. Do this _1-2_ times per day.

## 2020-04-29 NOTE — Therapy (Signed)
[name] MAIN [facility] SERVICES [address] [place], [place], [address] Phone: [phone]   Fax:  [phone]  Physical Therapy Treatment  The patient has been informed of current processes in place at Outpatient Rehab to protect patients from Covid-19 exposure including social distancing, schedule modifications, and new cleaning procedures. After discussing their particular risk with a therapist based on the patient's personal risk factors, the patient has decided to proceed with in-person therapy.   Patient Details  Name: Maria [name] MRN: [id number] Date of Birth: 09-Dec-1990 No data recorded  Encounter Date: 04/29/2020   PT End of Session - 04/29/20 1232    Visit Number 10    Number of Visits 14    Date for PT Re-Evaluation 03/08/20    Authorization Type mcaid    Authorization Time Period auth good through 9/28    Authorization - Visit Number 7    Authorization - Number of Visits 12    Progress Note Due on Visit 10    PT Start Time 0935    PT Stop Time 4650    PT Time Calculation (min) 60 min    Activity Tolerance Patient tolerated treatment well;No increased pain    Behavior During Therapy WFL for tasks assessed/performed           Past Medical History:  Diagnosis Date  . Acute pancreatitis   . Asthma   . GERD (gastroesophageal reflux disease)   . Hemorrhoid   . Hx of varicella   . Ovarian cyst   . Recurrent boils    legs and buttocks.  staff infection, was neg  . UTI (urinary tract infection)     Past Surgical History:  Procedure Laterality Date  . CESAREAN SECTION N/A 05/25/2016   Procedure: CESAREAN SECTION;  Surgeon: Christophe Louis, MD;  Location: [place];  Service: Obstetrics;  Laterality: N/A;  . TONSILLECTOMY    . WISDOM TOOTH EXTRACTION      There were no vitals filed for this visit.   Pelvic Floor Physical Therapy Treatment Note  SCREENING  Changes in medications, allergies, or medical  history?:  None    SUBJECTIVE  Patient reports: Back has been great, no pain/soreness. A little bit of pain in the R hip and burning when she urinates first thing in the morning. Still has her heel-lift in the R shoe. Pain is not terrible when she is relaxing, increases when bending/exercising.  Precautions:  L4-5 fusion for spondylolysis/herniated disk (Pt. Report), Asthma, GERD, Anxiety, Insomnia.  Sexual activity/pain: Has stopped having intercourse due to pain.  Location of pain: low back  Current pain: 0/10 Max pain: 0/10 Least pain: 0/10 Nature of pain: strong ache  **no increased pain following treatment  Location of pain: Pelvis (greatest with urination) Current pain: 6/10  Max pain: 9/10 Least pain: 0/10 Nature of pain:sharp/burning  **no pain following treatment.  Patient Goals: Not have pain when she needs to pee or with intercourse. Not pee her pants with cough/sneeze.   OBJECTIVE  Changes in: Posture/Observations:  R anterior rotation **following treatment R PSIS still slightly high in standing (from previous session)  Range of Motion/Flexibilty:  R SB ~ 4 fingers from knee, L side ~ 2 fingers from knee sore at L SIJ with R SB, ROT ~ 50% restricted to the R ~ 30% to the L but pain with L ROT. (from previous note)  Special tests:  (from previous note) Supine to long-sit: LLE long in lying  Scoliosis: L thoracic ? R lumbar (fusion) Supine-to-long-sit: LLE long in both following MET correction.   Strength/MMT:  LE MMT:   Pelvic Floor External Exam: (from previous visit) Introitus Appears: elevated Skin integrity: WNL Palpation: TTP to B IC and STP Cough: intact Prolapse visible?: no Scar mobility: decreased on R>L  Internal Vaginal Exam: Strength (PERF): 4/5, 5 sec. Symmetry: posterior>anterior Palpation: TTP throughout (greatest at coccygeus B) Prolapse: none visualized but Pt. Reports feeling of bladder prolapse.  TODAY: not  re-assessed today  Abdominal:  3 finger diastasis at umbilicus, 2 above.  Pt. Able to engage deep-core appropriately in hook-lying and performed mini-marches with MOD to MIN cueing. (from prior note)  Palpation: TTP to L>R mons and adductors.(from prior note)   Gait Analysis:  INTERVENTIONS THIS SESSION:  Self-care: educated on the importance of getting vaccinated on her health and how getting covid could set her back in her pain.   Manual: Performed MFR using static and dynamic cupping techniques to B lateral and anterior abdomen and along c-section scar to improve fascial mobility and decrease scar restriction to decrease burning/pain in the pelvis.  Total time: 60                               PT Short Term Goals - 03/16/20 0946      PT SHORT TERM GOAL #1   Title Patient will demonstrate improved pelvic alignment and balance of musculature surrounding the pelvis to facilitate decreased PFM spasms and decrease pelvic pain.    Baseline lower crossed syndrome As of 7/27: Pt. is only having a little bit of pain in the bladder mostly in the morning ~ 50% improved    Time 5    Period Weeks    Status On-going    Target Date 04/19/20      PT SHORT TERM GOAL #2   Title Patient will demonstrate HEP x1 in the clinic to demonstrate understanding and proper form to allow for further improvement.    Baseline Pt. lacks knowledge of therapeutic exercises that will help decrease her Sx.    Time 5    Period Weeks    Status Achieved    Target Date 02/02/20      PT SHORT TERM GOAL #3   Title Patient will report consistent use of foot-stool (squatty-potty) for positioning with BM to decrease pain with BM and intra-abdominal pressure.    Baseline Chronic constipation As of 7/27: did not need linzess and having a BM every to every-other day. Discussed today, Pt. demonstrates desire to comply with reccommendation.    Time 5    Period Weeks    Status On-going     Target Date 04/20/11      PT SHORT TERM GOAL #4   Title Pt. will demonstrate implementation of behavioral modifications such as fluid intake and use of Urge suppression technique to allow for decreased UUI/frequency.    Baseline Pt. urinating 9-12 times per day, having painful sensation when she needs to urinate. As of 03/16/20: discussed water intake and decreasing caffiene for constipation.    Time 5    Period Weeks    Status On-going    Target Date 04/19/20             PT Long Term Goals - 03/16/20 1013      PT LONG TERM GOAL #1   Title Patient will report no episodes of MUI over the course  of the prior two weeks to demonstrate improved functional ability.    Baseline Pt. having SUI with cough/sneeze, frequency of 9-12 times per day, and occl. urge incontinence. As of 7/27: Pt. having SUI only after 2nd or third sneeze,    Time 10    Period Weeks    Status New    Target Date 04/19/20      PT LONG TERM GOAL #2   Title Patient will describe pain no greater than 3/10 during work and when holding her 31 year old for ~ 30 min. to demonstrate improved functional ability.    Baseline Has pain as high as 8/10 with lifting her son and taking leaf blower up/down stairs.    Time 10    Period Weeks    Status Achieved    Target Date 03/08/20      PT LONG TERM GOAL #3   Title Patient will report having BM's at least every-other day with consistency between [facility] stool scale 3-5 over the prior week to demonstrate decreased constipation.    Baseline Chronic constipation which has been worse since surgery. As of 03/16/20: Pt. has not used linzess in a week and has had a BM every or every-other day but still straining a little.    Time 10    Period Weeks    Status Achieved    Target Date 03/08/20      PT LONG TERM GOAL #4   Title Pt. will improve in FOTO score by 10 points to demonstrate improved function.    Baseline FOTO PFDI Urinary: 63, Urinary Problem: 64    Time 10    Period Weeks     Status On-going    Target Date 04/19/20                 Plan - 04/29/20 1233    Clinical Impression Statement Pt. Responded well to all interventions today, demonstrating improved fascial mobility and resolution of pelvic pain/burning. as well as understanding and correct performance of all education and exercises provided today. They will continue to benefit from skilled physical therapy to work toward remaining goals and maximize function as well as decrease likelihood of symptom increase or recurrence.   PT Next Visit Plan Add glute strengthening, review mini-marches and focus on core strengthening, re-assess PFM and treat PRN. Ask about success with self release/pain with intercourse, full bladder sensation and constipation.    PT Home Exercise Plan vulvar hygiene, lumbar support/seated posture, seated pelvic tilts, hip flexor stretch at EOB, side stretch heel-lift for LLE, child's pose, mini-marches, water intake, decreasing coffee, squatty potty, urge suppression, probiotics, posterior fourchette release, squat form. MET correction for R anterior rotation.    Consulted and Agree with Plan of Care Patient           Patient will benefit from skilled therapeutic intervention in order to improve the following deficits and impairments:     Visit Diagnosis: Abnormal posture  Muscle spasm of back  Other muscle spasm     Problem List Patient Active Problem List   Diagnosis Date Noted  . Therapeutic opioid induced constipation 11/12/2019  . Spinal stenosis of lumbar region 11/12/2019  . Pelvic pain in female 11/12/2019  . Gastroesophageal reflux disease 11/12/2019  . Pre-bariatric surgery nutrition evaluation 08/11/2019  . Spondylolysis 10/30/2018  . Chronic back pain 10/30/2018  . Thrombocytosis ([place]) 06/09/2016  . S/P primary low transverse C-section 05/25/2016  . Allergy to penicillin (rash) 05/24/2016   Willa Rough  DPT, ATC Willa Rough 04/29/2020, 12:35  PM  [place] MAIN [facility] SERVICES [address] [place], [place], [address] Phone: [phone]   Fax:  [phone]  Name: Maria [name] MRN: [id number] Date of Birth: 03-28-1991

## 2020-05-13 ENCOUNTER — Other Ambulatory Visit: Payer: Self-pay

## 2020-05-13 ENCOUNTER — Ambulatory Visit: Payer: Medicaid Other

## 2020-05-13 DIAGNOSIS — R293 Abnormal posture: Secondary | ICD-10-CM

## 2020-05-13 DIAGNOSIS — M62838 Other muscle spasm: Secondary | ICD-10-CM

## 2020-05-13 DIAGNOSIS — M62830 Muscle spasm of back: Secondary | ICD-10-CM

## 2020-05-13 NOTE — Patient Instructions (Addendum)
  When seated, you want to maintain pelvic neutral with the shoulders gently down and back and ears in line with your shoulders. A lumbar roll such as the one below or a home-made towel-roll can be used for this purpose. Even Olympic athletes can only maintain proper seated posture for about 10 minutes without support!    Pictured: The Original [employer]iance Lumbar Roll   Your "usual posture" looks like the kyphotic-lordotic picture above, we want it to look more like the first picture labeled "normal" Where the earlobe, tip of the shoulder, hip, and knee are all in a straight line.  Make sure that your knees are un-locked but not bent.  Draw up as if an imaginary string were tied to the crown of your head stretching you up tall. Gently pull shoulders back and down without "pushing" chest out. Pull chin in slightly as if you were a turtle pulling into your shell until ears line up with tips of shoulders.     Tuck your hips under, then keep the tuck as you lean forward so you feel a stretch through your low back. Hold for 5 deep breaths, repeat 2-3 times, 1-2 times per day.   Keep your trunk as one unit and let it hinge forward from the hips as you push your bottom back and bend your knees at the same rate that you bend your hips. Keep your weight back toward your heels but do not actually lift the toes off the ground. Exhale starting just before and all the way through standing to help engage the glutes and lower tummy muscles.  Do 3x15. 1-2 times per day   Flexors, Lunge  Hip Flexor Stretch: Proposal Pose    Maintain pelvic tuck under, lift pubic bone toward navel. Engage posterior hip muscles (firm glute muscles of leg in back position) and shift forward until you feel stretch on front of leg that is down. To increase stretch, maintain balance and ease hips forward. You may use one hand on a chair for balance if needed. Hold for __5__ breaths. Repeat __2-3__ times each  leg.  Do _1-2__ times per day.    Start with your arms on a chair and tuck your hips under slightly and come forward until you are in a straight line. Make sure that your knees, your hips, and your shoulders are in a straight line. Keep strong and do not sag between the shoulders or the low back. Hold for ~ 30 seconds, (or until you feel like you are too tired to hold it correctly). Repeat 2-3 times (or up to ~ 1:30 sec. Total).   As you get stronger, you can lengthen each hold. When you can hold for ~ 1:30 straight, try switching to on the floor. You will need to decrease your hold time initially and build back up to the full 1:30.

## 2020-05-13 NOTE — Therapy (Signed)
[place] MAIN [facility] SERVICES [address] [place], [place], [address] Phone: [phone]   Fax:  [phone]  Physical Therapy Treatment and Discharge Summary  The patient has been informed of current processes in place at Outpatient Rehab to protect patients from Covid-19 exposure including social distancing, schedule modifications, and new cleaning procedures. After discussing their particular risk with a therapist based on the patient's personal risk factors, the patient has decided to proceed with in-person therapy.  Patient Details  Name: Maria [name] MRN: [id number] Date of Birth: [birth year] No data recorded  Encounter Date: 05/13/2020   PT End of Session - 05/13/20 1105    Visit Number 11    Number of Visits 14    Date for PT Re-Evaluation 03/08/20    Authorization Type mcaid    Authorization Time Period auth good through 9/28    Authorization - Visit Number 8    Authorization - Number of Visits 12    Progress Note Due on Visit 10    PT Start Time 0945    PT Stop Time 1100    PT Time Calculation (min) 75 min    Activity Tolerance Patient tolerated treatment well;No increased pain    Behavior During Therapy WFL for tasks assessed/performed           Past Medical History:  Diagnosis Date  . Acute pancreatitis   . Asthma   . GERD (gastroesophageal reflux disease)   . Hemorrhoid   . Hx of varicella   . Ovarian cyst   . Recurrent boils    legs and buttocks.  staff infection, was neg  . UTI (urinary tract infection)     Past Surgical History:  Procedure Laterality Date  . CESAREAN SECTION N/A 05/25/2016   Procedure: CESAREAN SECTION;  Surgeon: Christophe Louis, MD;  Location: [place];  Service: Obstetrics;  Laterality: N/A;  . TONSILLECTOMY    . WISDOM TOOTH EXTRACTION      There were no vitals filed for this visit.    Pelvic Floor Physical Therapy Treatment  And Discharge Summary  SCREENING  Changes in  medications, allergies, or medical history?:  None    SUBJECTIVE  Patient reports: Back gets a little sore if she bends a lot but it doesn't hurt and it goes away with just moving around and with relaxing for 1-2 hours.  She has not had any pain with intercourse or with urinating in the morning. No leakage with coughing/sneezing! Able to have a BM daily or every other day without straining.   Precautions:  L4-5 fusion for spondylolysis/herniated disk (Pt. Report), Asthma, GERD, Anxiety, Insomnia.  Sexual activity/pain: Has stopped having intercourse due to pain.  Location of pain: low back  Current pain: 0/10 Max pain: 0/10 Least pain: 0/10 Nature of pain: strong ache  **no pain following treatment  Location of pain: Pelvis (greatest with urination) Current pain: 0/10  Max pain: 0/10 Least pain: 0/10 Nature of pain:sharp/burning  **no pain following treatment.  Patient Goals: Not have pain when she needs to pee or with intercourse. Not pee her pants with cough/sneeze.   OBJECTIVE  Changes in: Posture/Observations:  Pelvis level with heel-lift in place, slight R anterior rotation in supine  **improved supine alignment following TP release.  Range of Motion/Flexibilty:  R SB ~ 4 fingers from knee, L side ~ 2 fingers from knee sore at L SIJ with R SB, ROT ~ 50% restricted to the R ~ 30% to  the L but pain with L ROT. (from previous note)  Special tests:   Strength/MMT:  LE MMT:   Pt. Had greater difficulty recruiting R glutes/TA due to Psoas spasm   **improved following release  Pelvic Floor External Exam: (from previous visit) Introitus Appears: elevated Skin integrity: WNL Palpation: TTP to B IC and STP Cough: intact Prolapse visible?: no Scar mobility: decreased on R>L  Internal Vaginal Exam: Strength (PERF): 4/5, 5 sec. Symmetry: posterior>anterior Palpation: TTP throughout (greatest at coccygeus B) Prolapse: none visualized but Pt.  Reports feeling of bladder prolapse.  TODAY: not re-assessed today but Pt. Reports no pain with intercourse or bladder filling.   Abdominal:  Pt. Able to engage TA appropriately with posterior pelvic tilt for planks, TA appropriately tenses across DR to create stability.  Palpation: TTP to R Psoas, L Iliacus   Gait Analysis:  INTERVENTIONS THIS SESSION:  Therex: Reviewed hip-flexor stretch, given low-back stretch To maintain and improve muscle length and allow for improved balance of musculature for long-term symptom relief. Educated on and practiced squats and planks to improve strength of muscles opposing tight musculature to allow reciprocal inhibition to improve balance of musculature surrounding the pelvis and improve overall posture for optimal musculature length-tension relationship and function. Educated on continuing her HEP for 2 months regularly and then tapering/ using PRN but keeping posture and habits in place to protect her.  Self-care: reviewed goals and determined appropriate POC was to D/C to HEP due to achievement of goals, understanding of HEP, and difficulty with scheduling due to other doctor's appointments, new job and child's appointments. Reviewed sitting and standing posture and emphasized the importance of practicing good posture on long-term relief.  Manual: Performed TP release to R Psoas, L Iliacus to decrease spasm and pain and allow for improved balance of musculature for improved function and decreased symptoms.  Total time: 60                                 PT Short Term Goals - 05/13/20 1052      PT SHORT TERM GOAL #1   Title Patient will demonstrate improved pelvic alignment and balance of musculature surrounding the pelvis to facilitate decreased PFM spasms and decrease pelvic pain.    Baseline lower crossed syndrome As of 7/27: Pt. is only having a little bit of pain in the bladder mostly in the morning ~ 50% improved     Time 5    Period Weeks    Status Achieved    Target Date 04/19/20      PT SHORT TERM GOAL #2   Title Patient will demonstrate HEP x1 in the clinic to demonstrate understanding and proper form to allow for further improvement.    Baseline Pt. lacks knowledge of therapeutic exercises that will help decrease her Sx.    Time 5    Period Weeks    Status Achieved    Target Date 02/02/20      PT SHORT TERM GOAL #3   Title Patient will report consistent use of foot-stool (squatty-potty) for positioning with BM to decrease pain with BM and intra-abdominal pressure.    Baseline Chronic constipation As of 7/27: did not need linzess and having a BM every to every-other day. Discussed today, Pt. demonstrates desire to comply with reccommendation.    Time 5    Period Weeks    Status Achieved    Target Date 04/20/11  PT SHORT TERM GOAL #4   Title Pt. will demonstrate implementation of behavioral modifications such as fluid intake and use of Urge suppression technique to allow for decreased UUI/frequency.    Baseline Pt. urinating 9-12 times per day, having painful sensation when she needs to urinate. As of 03/16/20: discussed water intake and decreasing caffiene for constipation.    Time 5    Period Weeks    Status Achieved    Target Date 04/19/20             PT Long Term Goals - 05/13/20 1053      PT LONG TERM GOAL #1   Title Patient will report no episodes of MUI over the course of the prior two weeks to demonstrate improved functional ability.    Baseline Pt. having SUI with cough/sneeze, frequency of 9-12 times per day, and occl. urge incontinence. As of 7/27: Pt. having SUI only after 2nd or third sneeze,    Time 10    Period Weeks    Status Achieved    Target Date 04/19/20      PT LONG TERM GOAL #2   Title Patient will describe pain no greater than 3/10 during work and when holding her 31 year old for ~ 30 min. to demonstrate improved functional ability.    Baseline Has pain as  high as 8/10 with lifting her son and taking leaf blower up/down stairs.    Time 10    Period Weeks    Status Achieved    Target Date 03/08/20      PT LONG TERM GOAL #3   Title Patient will report having BM's at least every-other day with consistency between [facility] stool scale 3-5 over the prior week to demonstrate decreased constipation.    Baseline Chronic constipation which has been worse since surgery. As of 03/16/20: Pt. has not used linzess in a week and has had a BM every or every-other day but still straining a little.    Time 10    Period Weeks    Status Achieved    Target Date 03/08/20      PT LONG TERM GOAL #4   Title Pt. will improve in FOTO score by 10 points to demonstrate improved function.    Baseline FOTO PFDI Urinary: 63, Urinary Problem: 64 As of 9/23: PFDI Urinary: 0, Urinary Problem: 98    Time 10    Period Weeks    Status Achieved    Target Date 04/19/20                 Plan - 05/13/20 1106    Clinical Impression Statement Pt. has met or exceeded all goals and is no longer having constipation or needing her linzess, she is not having any pain or burning with bladder filling, emptying, or intercourse and no back or hip pain with ADL's. She demonstrated understanding and correct performance of all HEP exercises today and knows that to maintain her improvement she needs to continue strengthening but we will D/C to HEP today.    PT Next Visit Plan D/C    PT Home Exercise Plan vulvar hygiene, lumbar support/seated posture, seated pelvic tilts, hip flexor stretch at EOB, side stretch heel-lift for LLE, child's pose, mini-marches, water intake, decreasing coffee, squatty potty, urge suppression, probiotics, posterior fourchette release, squat form. MET correction for R anterior rotation, low-back stretch, planks.    Consulted and Agree with Plan of Care Patient  Patient will benefit from skilled therapeutic intervention in order to improve the  following deficits and impairments:     Visit Diagnosis: Abnormal posture  Muscle spasm of back  Other muscle spasm     Problem List Patient Active Problem List   Diagnosis Date Noted  . Therapeutic opioid induced constipation 11/12/2019  . Spinal stenosis of lumbar region 11/12/2019  . Pelvic pain in female 11/12/2019  . Gastroesophageal reflux disease 11/12/2019  . Pre-bariatric surgery nutrition evaluation 08/11/2019  . Spondylolysis 10/30/2018  . Chronic back pain 10/30/2018  . Thrombocytosis ([place]) 06/09/2016  . S/P primary low transverse C-section 05/25/2016  . Allergy to penicillin (rash) 05/24/2016   Willa Rough DPT, ATC Willa Rough 05/13/2020, 11:20 AM  [place] MAIN [facility] SERVICES [address] [place], [place], [address] Phone: [phone]   Fax:  [phone]  Name: Lorenzo Pereyra MRN: [id number] Date of Birth: [birth year]

## 2020-05-20 ENCOUNTER — Other Ambulatory Visit: Payer: Self-pay

## 2020-05-27 ENCOUNTER — Ambulatory Visit: Payer: Medicaid Other | Admitting: Gastroenterology

## 2020-05-27 ENCOUNTER — Telehealth: Payer: Self-pay | Admitting: Gastroenterology

## 2020-05-27 NOTE — Telephone Encounter (Signed)
Patient called to cancel appt and stated that she was feeling better and will call our office if symptoms reoccur.

## 2020-06-17 ENCOUNTER — Other Ambulatory Visit: Payer: Self-pay

## 2020-06-17 ENCOUNTER — Ambulatory Visit (INDEPENDENT_AMBULATORY_CARE_PROVIDER_SITE_OTHER): Payer: Medicaid Other | Admitting: Dermatology

## 2020-06-17 ENCOUNTER — Encounter: Payer: Self-pay | Admitting: Dermatology

## 2020-06-17 DIAGNOSIS — L650 Telogen effluvium: Secondary | ICD-10-CM | POA: Diagnosis not present

## 2020-06-17 DIAGNOSIS — Z872 Personal history of diseases of the skin and subcutaneous tissue: Secondary | ICD-10-CM | POA: Diagnosis not present

## 2020-06-17 DIAGNOSIS — L739 Follicular disorder, unspecified: Secondary | ICD-10-CM | POA: Diagnosis not present

## 2020-06-17 DIAGNOSIS — L439 Lichen planus, unspecified: Secondary | ICD-10-CM

## 2020-06-17 MED ORDER — KETOCONAZOLE 2 % EX SHAM: 1.0000 "application " | MEDICATED_SHAMPOO | CUTANEOUS | 3 refills | Status: DC

## 2020-06-17 MED ORDER — DOXYCYCLINE HYCLATE 50 MG PO CAPS: 50.0000 mg | ORAL_CAPSULE | Freq: Every day | ORAL | 3 refills | Status: DC

## 2020-06-17 NOTE — Progress Notes (Signed)
   New Patient Visit  Subjective  Maria [name] is a 29 y.o. female who presents for the following: Lichen Planus (scalp, ~64yrs, itchy, bumps, and hair shedding, tried H&S shampoo, pt hx of bx proven Lichen Planus yrs ago on hands and feet, back surgery  08/2019, no recent sickness).  New patient referral from [facility] Terrilyn Saver NP.  The following portions of the chart were reviewed this encounter and updated as appropriate:  Tobacco  Allergies  Meds  Problems  Med Hx  Surg Hx  Fam Hx      Review of Systems:  No other skin or systemic complaints except as noted in HPI or Assessment and Plan.  Objective  Well appearing patient in no apparent distress; mood and affect are within normal limits.  A focused examination was performed including scalp. Relevant physical exam findings are noted in the Assessment and Plan.  Objective  hands, feet: Hands and feet clear  Objective  Scalp: Pustule L scalp   Assessment & Plan  History of biopsy proven Lichen planus  hands, feet Hx of Bx proven and treated years ago Clear today. 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  Folliculitis - sterile vs bacterial vs Pityrosporum Scalp Small pustules of scalp today /Rosacea/Pityrosporum folliculitis  Start Doxycycline 50mg  1 po qd with dinner Start Ketoconazole 2% shampoo 3d/wk  Discussed biopsy in future if not improving  doxycycline (VIBRAMYCIN) 50 MG capsule - Scalp  ketoconazole (NIZORAL) 2 % shampoo - Scalp  Telogen effluvium - hair loss associated most likely with recent surgery - will check Thyroid Scalp Most likely r/t Back surgery 08/2019 Discussed telogen effluvium, article given  Will check TSH (recent labs reviewed and no evidence of Thyroid tests)  If TSH normal, may discuss Biotin; Red light laser treatments; Rogaine etc.  Other Related Procedures TSH  Return in about 4 weeks (around 07/15/2020) for 4-6wks  folliculitis, Telogen Effluvium.   I, 07/17/2020, RMA, am acting as scribe for Ardis Rowan, MD .  Documentation: I have reviewed the above documentation for accuracy and completeness, and I agree with the above.  Armida Sans, MD

## 2020-07-28 ENCOUNTER — Other Ambulatory Visit: Payer: Self-pay

## 2020-07-28 ENCOUNTER — Ambulatory Visit: Payer: Medicaid Other | Admitting: Dermatology

## 2020-07-28 DIAGNOSIS — L739 Follicular disorder, unspecified: Secondary | ICD-10-CM | POA: Diagnosis not present

## 2020-07-28 DIAGNOSIS — L650 Telogen effluvium: Secondary | ICD-10-CM | POA: Diagnosis not present

## 2020-07-28 MED ORDER — DOXYCYCLINE MONOHYDRATE 100 MG PO CAPS: 100.0000 mg | ORAL_CAPSULE | Freq: Every day | ORAL | 3 refills | Status: DC

## 2020-07-28 NOTE — Progress Notes (Signed)
   Follow-Up Visit   Subjective  Maria [name] is a 29 y.o. female who presents for the following: folliculitis (scalp Doxycycline 50mg  1 po qd, Ketoconazole 2% shampoo 3d/wk improved) and Telogen Effluvium (scalp,  pt forgot to get labs for TSH).  The following portions of the chart were reviewed this encounter and updated as appropriate:   Tobacco  Allergies  Meds  Problems  Med Hx  Surg Hx  Fam Hx     Review of Systems:  No other skin or systemic complaints except as noted in HPI or Assessment and Plan.  Objective  Well appearing patient in no apparent distress; mood and affect are within normal limits.  A focused examination was performed including scalp, face. Relevant physical exam findings are noted in the Assessment and Plan.  Objective  Scalp, face: Small papules and pustules face, scalp clear   Assessment & Plan  Folliculitis/ Rosacea - chronic; persistent; not to goal Scalp, face Folliculitis - sterile vs bacterial vs Pityrosporum Rosacea/Pityrosporum folliculitis Improved  Increase to Doxycycline 100mg  1 po qd with food and drink D/c Doxycycline 50mg  Cont Ketoconazole 2% shampoo 3 nights a week, let sit several minutes and rinse off Recommend starting CLN shampoo on opposite days of Ketoconazole  May consider adding Diflucan in future  doxycycline (MONODOX) 100 MG capsule - Scalp, face  Other Related Medications doxycycline (VIBRAMYCIN) 50 MG capsule ketoconazole (NIZORAL) 2 % shampoo  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 There is no cure, only control. Condition is bothersome to patient. Not currently at goal. Scalp hair loss associated most likely with recent surgery  Will check thyroid If TSH normal, may discuss Biotin; Red light laser treatments; Rogaine etc  Return in about 2 months (around 09/28/2020) for Folliculitis, Telogen Effluvium.  I, , RMA, am acting as scribe for , MD  .  Documentation: I have reviewed the above documentation for accuracy and completeness, and I agree with the above.  11/26/2020, MD

## 2020-07-28 NOTE — Patient Instructions (Signed)
CLN shampoo to use on days not using Ketoconazole shampoo.  May use on scalp and forehead, let sit several minutes and rinse off.

## 2020-08-03 ENCOUNTER — Encounter: Payer: Self-pay | Admitting: Dermatology

## 2020-08-06 LAB — TSH: TSH: 1.15 u[IU]/mL (ref 0.450–4.500)

## 2020-08-09 ENCOUNTER — Telehealth: Payer: Self-pay

## 2020-08-09 NOTE — Telephone Encounter (Signed)
Patient advised thyroid results normal, Maria [name]

## 2020-09-14 ENCOUNTER — Other Ambulatory Visit: Payer: Self-pay

## 2020-09-15 ENCOUNTER — Encounter: Payer: Self-pay | Admitting: Gastroenterology

## 2020-09-15 ENCOUNTER — Ambulatory Visit (INDEPENDENT_AMBULATORY_CARE_PROVIDER_SITE_OTHER): Payer: Medicaid Other | Admitting: Gastroenterology

## 2020-09-15 ENCOUNTER — Other Ambulatory Visit: Payer: Self-pay

## 2020-09-15 VITALS — BP 122/81 | HR 103 | Temp 97.8°F | Ht 62.0 in | Wt 151.1 lb

## 2020-09-15 DIAGNOSIS — K640 First degree hemorrhoids: Secondary | ICD-10-CM | POA: Diagnosis not present

## 2020-09-15 DIAGNOSIS — K5909 Other constipation: Secondary | ICD-10-CM | POA: Diagnosis not present

## 2020-09-15 DIAGNOSIS — K601 Chronic anal fissure: Secondary | ICD-10-CM

## 2020-09-15 DIAGNOSIS — R7989 Other specified abnormal findings of blood chemistry: Secondary | ICD-10-CM | POA: Diagnosis not present

## 2020-09-15 NOTE — Patient Instructions (Signed)
[facility] [address], [place] [address]. Phone number [phone]. Please allow at least 24 hours before picking up the compounded cream because the pharmacy has to make the medication.   High-Fiber Eating Plan Fiber, also called dietary fiber, is a type of carbohydrate. It is found foods such as fruits, vegetables, whole grains, and beans. A high-fiber diet can have many health benefits. Your health care provider may recommend a high-fiber diet to help:  Prevent constipation. Fiber can make your bowel movements more regular.  Lower your cholesterol.  Relieve the following conditions: ? Inflammation of veins in the anus (hemorrhoids). ? Inflammation of specific areas of the digestive tract (uncomplicated diverticulosis). ? A problem of the large intestine, also called the colon, that sometimes causes pain and diarrhea (irritable bowel syndrome, or IBS).  Prevent overeating as part of a weight-loss plan.  Prevent heart disease, type 2 diabetes, and certain cancers. What are tips for following this plan? Reading food labels  Check the nutrition facts label on food products for the amount of dietary fiber. Choose foods that have 5 grams of fiber or more per serving.  The goals for recommended daily fiber intake include: ? Men (age 50 or younger): 34-38 g. ? Men (over age 50): 28-34 g. ? Women (age 50 or younger): 25-28 g. ? Women (over age 50): 22-25 g. Your daily fiber goal is _____________ g.   Shopping  Choose whole fruits and vegetables instead of processed forms, such as apple juice or applesauce.  Choose a wide variety of high-fiber foods such as avocados, lentils, oats, and kidney beans.  Read the nutrition facts label of the foods you choose. Be aware of foods with added fiber. These foods often have high sugar and sodium amounts per serving. Cooking  Use whole-grain flour for baking and cooking.  Cook with brown rice instead of white rice. Meal  planning  Start the day with a breakfast that is high in fiber, such as a cereal that contains 5 g of fiber or more per serving.  Eat breads and cereals that are made with whole-grain flour instead of refined flour or white flour.  Eat brown rice, bulgur wheat, or millet instead of white rice.  Use beans in place of meat in soups, salads, and pasta dishes.  Be sure that half of the grains you eat each day are whole grains. General information  You can get the recommended daily intake of dietary fiber by: ? Eating a variety of fruits, vegetables, grains, nuts, and beans. ? Taking a fiber supplement if you are not able to take in enough fiber in your diet. It is better to get fiber through food than from a supplement.  Gradually increase how much fiber you consume. If you increase your intake of dietary fiber too quickly, you may have bloating, cramping, or gas.  Drink plenty of water to help you digest fiber.  Choose high-fiber snacks, such as berries, raw vegetables, nuts, and popcorn. What foods should I eat? Fruits Berries. Pears. Apples. Oranges. Avocado. Prunes and raisins. Dried figs. Vegetables Sweet potatoes. Spinach. Kale. Artichokes. Cabbage. Broccoli. Cauliflower. Green peas. Carrots. Squash. Grains Whole-grain breads. Multigrain cereal. Oats and oatmeal. Brown rice. Barley. Bulgur wheat. Millet. Quinoa. Bran muffins. Popcorn. Rye wafer crackers. Meats and other proteins Navy beans, kidney beans, and pinto beans. Soybeans. Split peas. Lentils. Nuts and seeds. Dairy Fiber-fortified yogurt. Beverages Fiber-fortified soy milk. Fiber-fortified orange juice. Other foods Fiber bars. The items listed above may   not be a complete list of recommended foods and beverages. Contact a dietitian for more information. What foods should I avoid? Fruits Fruit juice. Cooked, strained fruit. Vegetables Fried potatoes. Canned vegetables. Well-cooked vegetables. Grains White bread.  Pasta made with refined flour. White rice. Meats and other proteins Fatty cuts of meat. Fried chicken or fried fish. Dairy Milk. Yogurt. Cream cheese. Sour cream. Fats and oils Butters. Beverages Soft drinks. Other foods Cakes and pastries. The items listed above may not be a complete list of foods and beverages to avoid. Talk with your dietitian about what choices are best for you. Summary  Fiber is a type of carbohydrate. It is found in foods such as fruits, vegetables, whole grains, and beans.  A high-fiber diet has many benefits. It can help to prevent constipation, lower blood cholesterol, aid weight loss, and reduce your risk of heart disease, diabetes, and certain cancers.  Increase your intake of fiber gradually. Increasing fiber too quickly may cause cramping, bloating, and gas. Drink plenty of water while you increase the amount of fiber you consume.  The best sources of fiber include whole fruits and vegetables, whole grains, nuts, seeds, and beans. This information is not intended to replace advice given to you by your health care provider. Make sure you discuss any questions you have with your health care provider. Document Revised: 12/11/2019 Document Reviewed: 12/11/2019 Elsevier Patient Education  2021 Elsevier Inc.  

## 2020-09-15 NOTE — Progress Notes (Signed)
Arlyss Repress, MD [address]  [address]  [place], [place] [address]  Main: [phone]  Fax: [phone]    Gastroenterology Consultation  Referring Provider:     Marcellus Scott, MD Primary Care Physician:  Pcp, No Primary Gastroenterologist:  Dr. Arlyss Repress Reason for Consultation:     Chronic constipation, elevated LFTs        HPI:   Maria [name] is a 30 y.o. female referred by Dr. Blenda Nicely  for consultation & management of chronic constipation.  Patient reports that for more than a year, she has been experiencing irregular bowel habits, describes on Bristol stool scale as 1 or 2.  She was started on Linzess 72 MCG by her PCP who is now retired.  She reported experiencing diarrhea when she was taking Linzess daily with 3-4 bowel movements after she takes the pill.  Therefore, she is not taking it as needed only.  She does report abdominal bloating.  She is also experiencing perianal itching, pressure/swelling and reports that she notices blood on wiping once in a blue moon.  Her weight is stable.  She does acknowledge that she does not get enough fiber in her diet.  She manages to drink adequate water daily.  She denies abdominal pain, weight loss.  She is also noticed to have elevated transaminases, ALT 118, AST 43 in 03/2020.  Patient reports that she was taking excessive amounts of Tylenol for headache.  She is currently seeing a headache specialist and her headaches are better managed now.  She denies alcohol intake  NSAIDs: None  Antiplts/Anticoagulants/Anti thrombotics: None  GI Procedures: None She denies family history of GI malignancy  Past Medical History:  Diagnosis Date  . Acute pancreatitis   . Asthma   . GERD (gastroesophageal reflux disease)   . Hemorrhoid   . Hx of varicella   . Ovarian cyst   . Recurrent boils    legs and buttocks.  staff infection, was neg  . UTI (urinary tract infection)     Past Surgical History:  Procedure  Laterality Date  . CESAREAN SECTION N/A 05/25/2016   Procedure: CESAREAN SECTION;  Surgeon: Gerald Leitz, MD;  Location: [facility] BIRTHING SUIT[facility];  Service: Obstetrics;  Laterality: N/A;  . TONSILLECTOMY    . WISDOM TOOTH EXTRACTION      Current Outpatient Medications:  .  cetirizine (ZYRTEC) 10 MG tablet, Take 1 tablet by mouth daily., Disp: , Rfl:  .  cyclobenzaprine (FLEXERIL) 5 MG tablet, Take 1 tablet by mouth as needed., Disp: , Rfl:  .  D3-50 1.25 MG (50000 UT) capsule, Take 50,000 Units by mouth once a week., Disp: , Rfl:  .  ketoconazole (NIZORAL) 2 % shampoo, Apply 1 application topically 3 (three) times a week. Shampoo scalp 3 times weekly, let sit several minutes and rinse out, Disp: 120 mL, Rfl: 3 .  linaclotide (LINZESS) 72 MCG capsule, Take 72 mcg by mouth daily before breakfast., Disp: , Rfl:  .  medroxyPROGESTERone (DEPO-PROVERA) 150 MG/ML injection, Inject 150 mg into the muscle every 3 (three) months., Disp: , Rfl:  .  nortriptyline (PAMELOR) 10 MG capsule, Start Nortriptyline (Pamelor) 10 mg nightly for one week, then increase to 20 mg nightly, Disp: , Rfl:  .  oxyCODONE-acetaminophen (PERCOCET/ROXICET) 5-325 MG tablet, Take by mouth every 4 (four) hours as needed for severe pain., Disp: , Rfl:  .  pantoprazole (PROTONIX) 40 MG tablet, Take 40 mg by mouth daily., Disp: , Rfl:  Family History  Problem Relation Age of Onset  . Hypertension Maternal Grandmother   . Heart disease Maternal Grandfather   . Stroke Maternal Grandfather   . Diabetes Neg Hx   . Cancer Neg Hx      Social History   Tobacco Use  . Smoking status: Former Smoker    Packs/day: 0.50    Types: Cigarettes    Quit date: 11/07/2013    Years since quitting: 6.8  . Smokeless tobacco: Former Engineer, water Use Topics  . Alcohol use: Yes    Alcohol/week: 1.0 standard drink    Types: 1 Cans of beer per week    Comment: occasional not while preg  . Drug use: No    Allergies as of 09/15/2020 - Review  Complete 09/15/2020  Allergen Reaction Noted  . Penicillins Hives and Other (See Comments) 09/08/2012    Review of Systems:    All systems reviewed and negative except where noted in HPI.   Physical Exam:  BP 122/81 (BP Location: Left Arm, Patient Position: Sitting, Cuff Size: Normal)   Pulse (!) 103   Temp 97.8 F (36.6 C) (Oral)   Ht 5\' 2"  (1.575 m)   Wt 151 lb 2 oz (68.5 kg)   BMI 27.64 kg/m  No LMP recorded. Patient has had an injection.  General:   Alert,  Well-developed, well-nourished, pleasant and cooperative in NAD Head:  Normocephalic and atraumatic. Eyes:  Sclera clear, no icterus.   Conjunctiva pink. Ears:  Normal auditory acuity. Nose:  No deformity, discharge, or lesions. Mouth:  No deformity or lesions,oropharynx pink & moist. Neck:  Supple; no masses or thyromegaly. Lungs:  Respirations even and unlabored.  Clear throughout to auscultation.   No wheezes, crackles, or rhonchi. No acute distress. Heart:  Regular rate and rhythm; no murmurs, clicks, rubs, or gallops. Abdomen:  Normal bowel sounds. Soft, non-tender and non-distended without masses, hepatosplenomegaly or hernias noted.  No guarding or rebound tenderness.   Rectal: Normal perianal skin, mild sharp tenderness in the posterior wall of the anal canal, palpable external hemorrhoids Msk:  Symmetrical without gross deformities. Good, equal movement & strength bilaterally. Pulses:  Normal pulses noted. Extremities:  No clubbing or edema.  No cyanosis. Neurologic:  Alert and oriented x3;  grossly normal neurologically. Skin:  Intact without significant lesions or rashes. No jaundice. Psych:  Alert and cooperative. Normal mood and affect.  Imaging Studies: Reviewed  Assessment and Plan:   Maria [name] is a 30 y.o. pleasant Caucasian female with remote history of acute pancreatitis thought to be secondary to alcohol use, chronic constipation and symptomatic external hemorrhoids  Chronic  constipation Discussed about high-fiber diet, information provided Discussed about fiber supplements, handouts given Adequate intake of water Advised her to take Linzess 72 MCG daily in order to regulate her bowel movements  Chronic posterior anal fissure Recommend 0.125% nitroglycerin with 5% lidocaine, instructions provided  Grade 1 symptomatic external hemorrhoids Discussed about hemorrhoid ligation which can be performed in future if her symptoms are persistent despite above measures  Elevated LFTs Recheck LFTs today, if persistently elevated, will proceed with further work-up   Follow up in 2 to 3 months   38, MD

## 2020-09-16 LAB — HEPATIC FUNCTION PANEL
ALT: 49 IU/L — ABNORMAL HIGH (ref 0–32)
AST: 28 IU/L (ref 0–40)
Albumin: 5 g/dL — ABNORMAL HIGH (ref 3.8–4.8)
Alkaline Phosphatase: 79 IU/L (ref 44–121)
Bilirubin Total: 0.3 mg/dL (ref 0.0–1.2)
Bilirubin, Direct: <0.1 mg/dL (ref 0.00–0.40)
Total Protein: 7.2 g/dL (ref 6.0–8.5)

## 2020-10-18 ENCOUNTER — Other Ambulatory Visit: Payer: Self-pay

## 2020-10-18 ENCOUNTER — Ambulatory Visit (INDEPENDENT_AMBULATORY_CARE_PROVIDER_SITE_OTHER): Payer: Medicaid Other | Admitting: Dermatology

## 2020-10-18 DIAGNOSIS — L739 Follicular disorder, unspecified: Secondary | ICD-10-CM

## 2020-10-18 DIAGNOSIS — L509 Urticaria, unspecified: Secondary | ICD-10-CM | POA: Diagnosis not present

## 2020-10-18 DIAGNOSIS — L700 Acne vulgaris: Secondary | ICD-10-CM

## 2020-10-18 MED ORDER — DOXYCYCLINE MONOHYDRATE 100 MG PO CAPS: 100.0000 mg | ORAL_CAPSULE | Freq: Every day | ORAL | 2 refills | Status: DC

## 2020-10-18 MED ORDER — KETOCONAZOLE 2 % EX SHAM: 1.0000 "application " | MEDICATED_SHAMPOO | CUTANEOUS | 3 refills | Status: DC

## 2020-10-18 NOTE — Progress Notes (Signed)
   Follow-Up Visit   Subjective  Maria [name] is a 30 y.o. female who presents for the following: Follow-up (Patient here today for 2 month folliculitis and telogen effluvium follow up. She is using ketoconazole 2% shampoo 2-3 times a day and [place] taking doxycycline 100mg  daily. She advises they are not helping and she is having a lot of itching at scalp and now at face. Patient was seen for telogen effluvium and had thyroid tested which was normal. ). 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 There was no evidence at scalp at patient's last visit. Patient states that it feels like something is crawling on her skin. She is scheduled to have MRI for migraines for which she takes nortriptyline.  The following portions of the chart were reviewed this encounter and updated as appropriate:   Tobacco  Allergies  Meds  Problems  Med Hx  Surg Hx  Fam Hx     Review of Systems:  No other skin or systemic complaints except as noted in HPI or Assessment and Plan.  Objective  Well appearing patient in no apparent distress; mood and affect are within normal limits.  A focused examination was performed including scalp. Relevant physical exam findings are noted in the Assessment and Plan.  Objective  Scalp: scalp clear today  Objective  Scalp: Patient with hx of itching  Objective  face: Few papules   Assessment & Plan  Folliculitis / Rosacea scalp Scalp Resolved today.  Chronic condition. Hx of folliculitis  Continue doxycycline 100mg  once daily with food Continue ketoconazole 2% shampoo apply three times per week, massage into scalp and leave in for 10 minutes before rinsing out  Rosacea is a chronic progressive skin condition usually affecting the face of adults, causing redness and/or acne bumps. It is treatable but not curable. It sometimes affects the eyes (ocular rosacea) as well. It may respond to topical and/or systemic medication and  can flare with stress, sun exposure, alcohol, exercise and some foods.  Daily application of broad spectrum spf 30+ sunscreen to face is recommended to reduce flares.  Doxycycline should be taken with food to prevent nausea. Do not lay down for 30 minutes after taking. Be cautious with sun exposure and use good sun protection while on this medication. Pregnant women should not take this medication.   Other Related Medications ketoconazole (NIZORAL) 2 % shampoo doxycycline (MONODOX) 100 MG capsule  Hives / Urticaria of scalp and face - clear today, but history of bumps and severe itch every night. Scalp Chronic; recurrent. Start OTC Allegra 180mg  in the morning for 3-4 days and if still having trouble may increase to 2 a day.  If in 1 week she is still having trouble we will send  in Singulair 10mg  daily and she will continue the Allegra.   May continue cetirizine in the afternoon  Consider prednisone in future if needed.  Patient's labs from August 2021 and January 2022 reviewed with patient. ALT's elevated in August, much improved in January. Recommend patient discuss with PCP.   Acne vulgaris face Chronic; persistent  - With pustules x 3 forehead. Treat as per Folliculitis / Rosacea above. Re-evaluate on fu.  Return in about 6 weeks (around 11/29/2020).  February 2022, RMA, am acting as scribe for September, MD . Documentation: I have reviewed the above documentation for accuracy and completeness, and I agree with the above.  February, MD

## 2020-10-18 NOTE — Patient Instructions (Addendum)
Continue doxycycline 100mg  once daily with food Continue ketoconazole 2% shampoo apply three times per week, massage into scalp and leave in for 10 minutes before rinsing out  Doxycycline should be taken with food to prevent nausea. Do not lay down for 30 minutes after taking. Be cautious with sun exposure and use good sun protection while on this medication. Pregnant women should not take this medication.   Start over the counter Allegra 180mg  in the morning for 3-4 days and if still having trouble may increase to 2 a day. If in 1 week she is still having trouble we will send  in Singulair 10mg  daily and she will continue the Allegra.   May continue cetirizine in the afternoon

## 2020-10-19 ENCOUNTER — Encounter: Payer: Self-pay | Admitting: Dermatology

## 2020-11-02 ENCOUNTER — Other Ambulatory Visit: Payer: Self-pay | Admitting: Neurology

## 2020-11-02 ENCOUNTER — Other Ambulatory Visit (HOSPITAL_COMMUNITY): Payer: Self-pay | Admitting: Neurology

## 2020-11-02 DIAGNOSIS — R519 Headache, unspecified: Secondary | ICD-10-CM

## 2020-11-05 ENCOUNTER — Other Ambulatory Visit: Payer: Self-pay

## 2020-11-05 ENCOUNTER — Ambulatory Visit
Admission: RE | Admit: 2020-11-05 | Discharge: 2020-11-05 | Disposition: A | Discharge status: Home / Self Care | Payer: Medicaid Other | Source: Ambulatory Visit | Attending: Neurology | Admitting: Neurology

## 2020-11-05 DIAGNOSIS — R519 Headache, unspecified: Secondary | ICD-10-CM | POA: Diagnosis not present

## 2020-11-16 ENCOUNTER — Encounter: Payer: Self-pay | Admitting: Emergency Medicine

## 2020-11-16 ENCOUNTER — Emergency Department: Payer: Medicaid Other

## 2020-11-16 ENCOUNTER — Emergency Department
Admission: EM | Admit: 2020-11-16 | Discharge: 2020-11-16 | Disposition: A | Discharge status: Home / Self Care | Payer: Medicaid Other | Attending: Emergency Medicine | Admitting: Emergency Medicine

## 2020-11-16 ENCOUNTER — Other Ambulatory Visit: Payer: Self-pay

## 2020-11-16 DIAGNOSIS — S3992XA Unspecified injury of lower back, initial encounter: Secondary | ICD-10-CM | POA: Diagnosis present

## 2020-11-16 DIAGNOSIS — Y92410 Unspecified street and highway as the place of occurrence of the external cause: Secondary | ICD-10-CM | POA: Insufficient documentation

## 2020-11-16 DIAGNOSIS — Z87891 Personal history of nicotine dependence: Secondary | ICD-10-CM | POA: Insufficient documentation

## 2020-11-16 DIAGNOSIS — J45909 Unspecified asthma, uncomplicated: Secondary | ICD-10-CM | POA: Diagnosis not present

## 2020-11-16 DIAGNOSIS — S39012A Strain of muscle, fascia and tendon of lower back, initial encounter: Secondary | ICD-10-CM | POA: Diagnosis not present

## 2020-11-16 DIAGNOSIS — S20212A Contusion of left front wall of thorax, initial encounter: Secondary | ICD-10-CM | POA: Insufficient documentation

## 2020-11-16 LAB — POC URINE PREG, ED: Preg Test, Ur: NEGATIVE

## 2020-11-16 MED ORDER — METHOCARBAMOL 500 MG PO TABS: 500.0000 mg | ORAL_TABLET | Freq: Four times a day (QID) | ORAL | 0 refills | Status: DC

## 2020-11-16 MED ORDER — HYDROCODONE-ACETAMINOPHEN 5-325 MG PO TABS: 1.0000 | ORAL_TABLET | ORAL | 0 refills | Status: DC | PRN

## 2020-11-16 MED ORDER — MELOXICAM 15 MG PO TABS: 15.0000 mg | ORAL_TABLET | Freq: Every day | ORAL | 0 refills | Status: DC
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
## 2020-11-17 IMAGING — CR DG ABDOMEN ACUTE W/ 1V CHEST
4 series · 4 of 4 positions shown · non-contrast
Comparison: CT abdomen and pelvis [DATE] [DATE], [DATE]; [DATE] [DATE], [DATE]

CLINICAL DATA: Abdominal pain

EXAM:
DG ABDOMEN ACUTE W/ 1V CHEST

[chest pa]
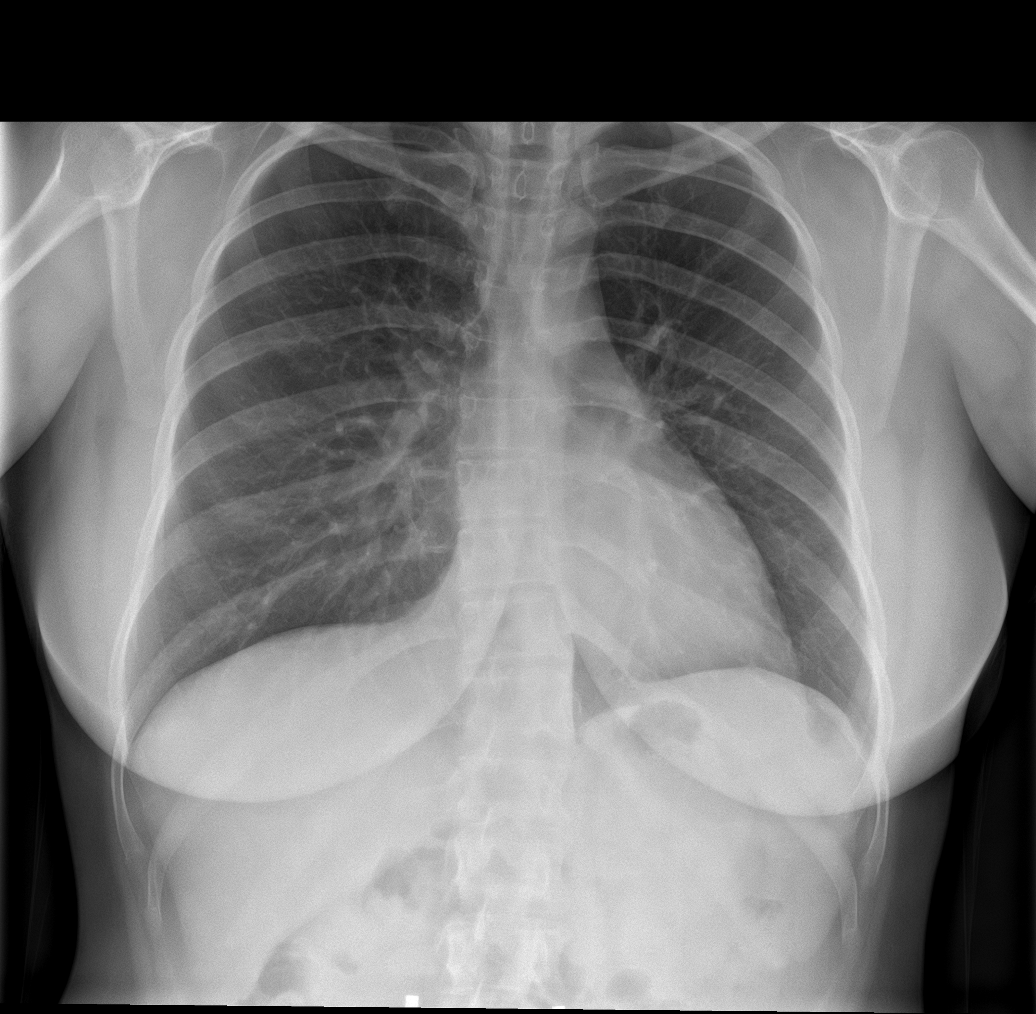

[abdomen erect]
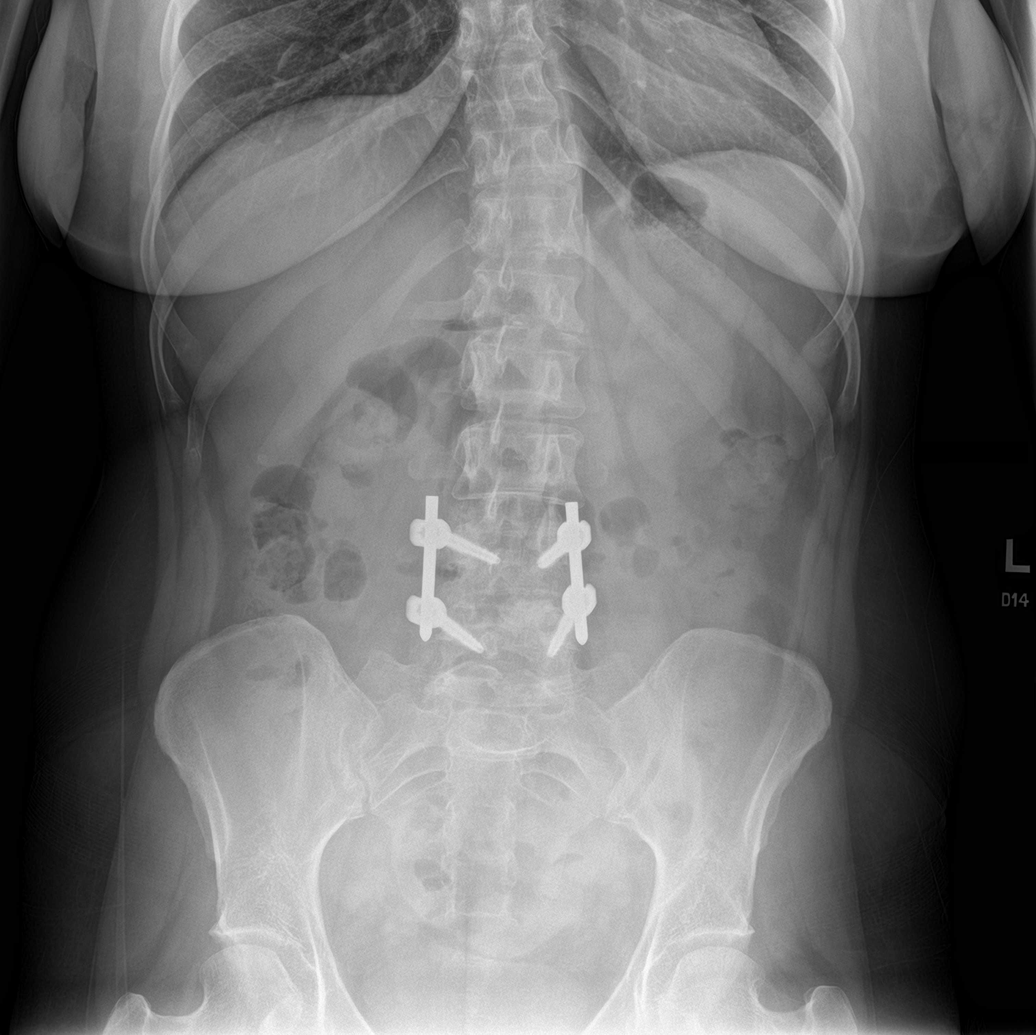

[abdomen kub (1 of 2)]
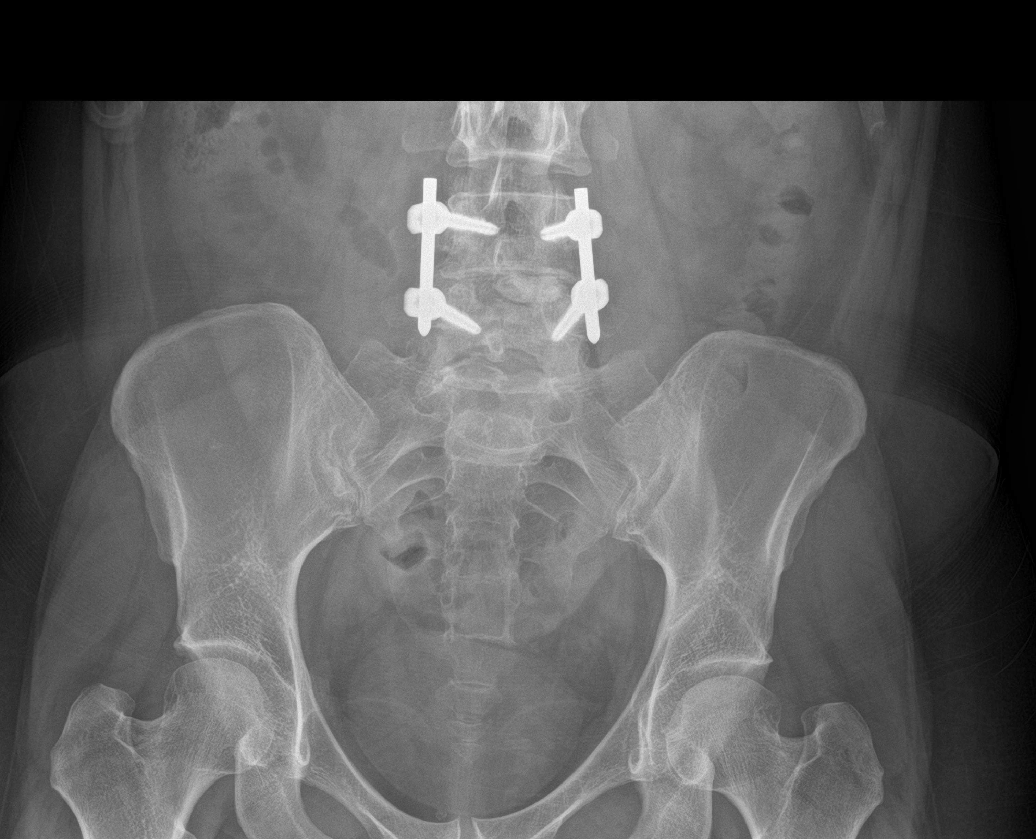

[abdomen kub (2 of 2)]
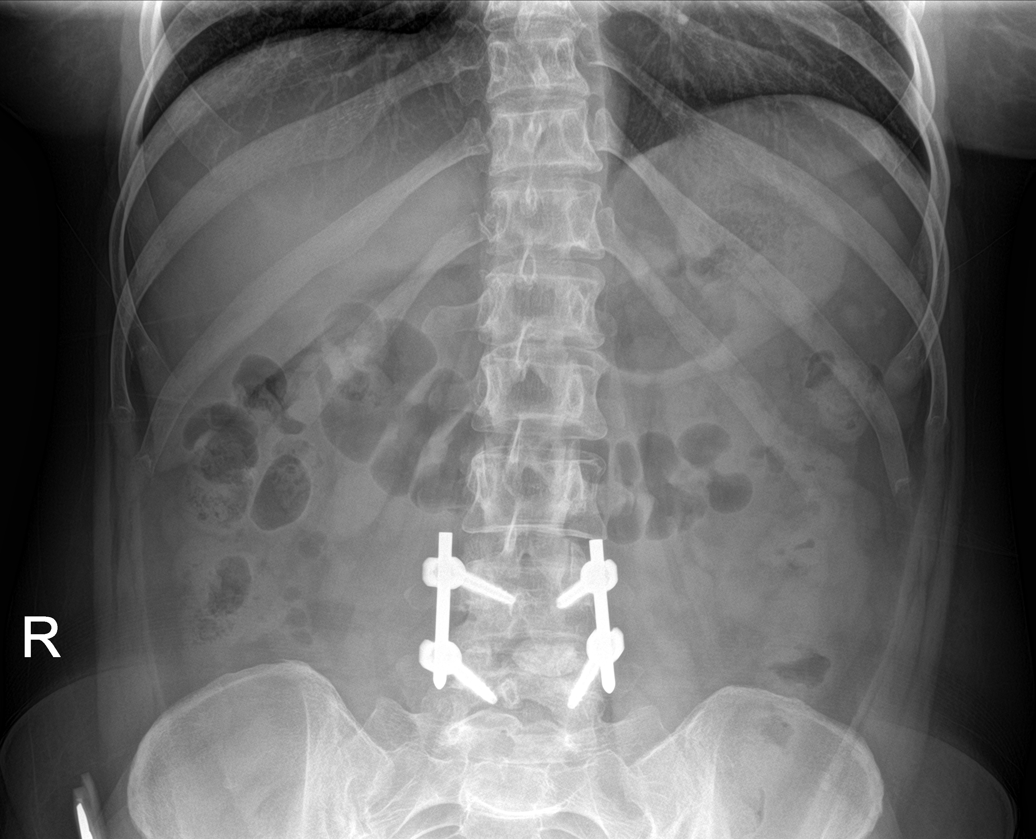

[4 of 4 positions shown; findings below may reference images not displayed]

FINDINGS: PA chest: Lungs are clear. Heart size and pulmonary vascularity are
normal. No adenopathy.

Supine and upright images obtained. There is moderate stool in the
colon. There is no bowel dilatation or air-fluid level to suggest
bowel obstruction. No free air. Postoperative changes are noted at
L4 and L5 in the lumbar region.
IMPRESSION: Moderate stool in colon. No bowel obstruction or free air. Lungs
clear.

## 2020-12-01 ENCOUNTER — Ambulatory Visit (INDEPENDENT_AMBULATORY_CARE_PROVIDER_SITE_OTHER): Payer: Medicaid Other | Admitting: Dermatology

## 2020-12-01 ENCOUNTER — Other Ambulatory Visit: Payer: Self-pay

## 2020-12-01 DIAGNOSIS — L509 Urticaria, unspecified: Secondary | ICD-10-CM | POA: Diagnosis not present

## 2020-12-01 DIAGNOSIS — L739 Follicular disorder, unspecified: Secondary | ICD-10-CM

## 2020-12-01 MED ORDER — SEYSARA 100 MG PO TABS: ORAL_TABLET | ORAL | 2 refills | Status: DC

## 2020-12-01 NOTE — Progress Notes (Signed)
   Follow-Up Visit   Subjective  Maria [name] is a 30 y.o. female who presents for the following: folliculitis/rosacea (Of the scalp - some improvement with Doxycycline 100mg  po QD and Ketoconazole 2% shampoo 3d/wk, but patient continues to have bumps of the scalp) and urticaria/hives (Of the face and scalp - patient currently using Zyrtec and Allegra, but thinks the Singulair could be helpful. She has a hx of skin rashes and allergies as a child).  The following portions of the chart were reviewed this encounter and updated as appropriate:   Tobacco  Allergies  Meds  Problems  Med Hx  Surg Hx  Fam Hx     Review of Systems:  No other skin or systemic complaints except as noted in HPI or Assessment and Plan.  Objective  Well appearing patient in no apparent distress; mood and affect are within normal limits.  A focused examination was performed including the face and scalp. Relevant physical exam findings are noted in the Assessment and Plan.  Objective  Scalp: Crusts  Objective  Face and scalp: Clear  today   Assessment & Plan  Folliculitis Scalp /rosacea - Rosacea is a chronic progressive skin condition usually affecting the face of adults, causing redness and/or acne bumps. It is treatable but not curable. It sometimes affects the eyes (ocular rosacea) as well. It may respond to topical and/or systemic medication and can flare with stress, sun exposure, alcohol, exercise and some foods.  Daily application of broad spectrum spf 30+ sunscreen to face is recommended to reduce flares. Discussed with patient that condition is difficult to treat.  D/C Doxycycline and  start Seysara 100mg  po QD.   Continue Ketoconazole 2% shampoo 3d/wk. Let sit 10-15 minutes before washing out.  Recommend adding Salicylic Acid shampoo on days not using Ketoconazole shampoo. Samples given of Neutrogena T-Sal shampoo.  Consider biopsy in future?  Consider Isotretinoin if not improving with  Seysara.  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 Continue Allegra but increase to BID or 2 po QD.  Consider Singulair in the future if not improved with Allegra BID.  Return for follow up in 6-12 weeks .  , CMA, am acting as scribe for 8-12, MD .  Documentation: I have reviewed the above documentation for accuracy and completeness, and I agree with the above.  Maylene Roes, MD

## 2020-12-01 NOTE — Patient Instructions (Signed)

## 2020-12-02 ENCOUNTER — Encounter: Payer: Self-pay | Admitting: Dermatology

## 2020-12-07 DIAGNOSIS — F439 Reaction to severe stress, unspecified: Secondary | ICD-10-CM | POA: Insufficient documentation

## 2020-12-07 DIAGNOSIS — G479 Sleep disorder, unspecified: Secondary | ICD-10-CM | POA: Insufficient documentation

## 2020-12-13 ENCOUNTER — Other Ambulatory Visit: Payer: Self-pay

## 2020-12-14 ENCOUNTER — Ambulatory Visit: Payer: Medicaid Other | Admitting: Gastroenterology

## 2020-12-21 ENCOUNTER — Telehealth: Payer: Self-pay

## 2020-12-21 NOTE — Telephone Encounter (Signed)
Seysara PA denied. Patient has Medicaid and this not on their formulary even as a non preferred option.

## 2020-12-21 NOTE — Telephone Encounter (Signed)
Can do Minocycline 100 mg 1 po qd with dinner/food #30 with 3 rf

## 2020-12-23 MED ORDER — MINOCYCLINE HCL 100 MG PO CAPS: 100.0000 mg | ORAL_CAPSULE | Freq: Every day | ORAL | 3 refills | Status: AC

## 2020-12-23 NOTE — Telephone Encounter (Signed)
Alternative sent in. MyChart message sent.

## 2021-01-26 ENCOUNTER — Other Ambulatory Visit: Payer: Self-pay

## 2021-01-26 ENCOUNTER — Ambulatory Visit (INDEPENDENT_AMBULATORY_CARE_PROVIDER_SITE_OTHER): Payer: Medicaid Other | Admitting: Dermatology

## 2021-01-26 DIAGNOSIS — L739 Follicular disorder, unspecified: Secondary | ICD-10-CM | POA: Diagnosis not present

## 2021-01-26 DIAGNOSIS — L509 Urticaria, unspecified: Secondary | ICD-10-CM

## 2021-01-26 NOTE — Patient Instructions (Signed)

## 2021-01-26 NOTE — Progress Notes (Signed)
   Follow-Up Visit   Subjective  Maria [name] is a 30 y.o. female who presents for the following: Follow-up (6 weeks f/u on folliculitis on her scalp treating with Seysara 100 mg tablets and Ketoconazole shampoo). Pt report she feel like bumps are on her scalp, then she will pop the bump which will leave a scab. Pt c/o hair loss.  The following portions of the chart were reviewed this encounter and updated as appropriate:   Tobacco  Allergies  Meds  Problems  Med Hx  Surg Hx  Fam Hx      Review of Systems:  No other skin or systemic complaints except as noted in HPI or Assessment and Plan.  Objective  Well appearing patient in no apparent distress; mood and affect are within normal limits.  A focused examination was performed including face,scalp . Relevant physical exam findings are noted in the Assessment and Plan.  Scalp 3 Micro pustules on the left scalp   face, scalp Clear    Assessment & Plan  Folliculitis / Acne / Rosacea of scalp Scalp Folliculitis/Rosacea  She has not responded to topicals; oral doxycycline or oral Seysara.  Rosacea is a chronic progressive skin condition usually affecting the face of adults, causing redness and/or acne bumps. It is treatable but not curable. It sometimes affects the eyes (ocular rosacea) as well. It may respond to topical and/or systemic medication and can flare with stress, sun exposure, alcohol, exercise and some foods.  Daily application of broad spectrum spf 30+ sunscreen to face is recommended to reduce flares.   Discussed Isotretinoin therapy   Urine pregnancy test performed in office today and was negative.  Patient demonstrates comprehension and confirms she will not get pregnant.    Patient on Depo-Provera birth control  Female latex condoms  Pt registered in [employer] program   Related Medications ketoconazole (NIZORAL) 2 % shampoo Apply 1 application topically 3 (three) times a week. Shampoo scalp 3 times weekly,  let sit several minutes and rinse out  Sarecycline HCl (SEYSARA) 100 MG TABS Take one tab po QD.  Urticaria with pruritus of face and scalp - currently improved, but not to goal face, scalp Chronic and persistent. Continue Allegra but increase to BID or 2 po QD.  Consider Singulair in the future if not improved with Allegra BID.   Consider biopsy if indicated.  Return in about 1 month (around 02/25/2021) for follculitis/ rosacea- see Dr Neale Burly with Dr Kirtland Bouchard at the next office visit .  IAngelique Holm, CMA, am acting as scribe for Armida Sans, MD .  Documentation: I have reviewed the above documentation for accuracy and completeness, and I agree with the above.  Armida Sans, MD

## 2021-01-28 ENCOUNTER — Encounter: Payer: Self-pay | Admitting: Dermatology

## 2021-01-31 ENCOUNTER — Ambulatory Visit (INDEPENDENT_AMBULATORY_CARE_PROVIDER_SITE_OTHER): Payer: Medicaid Other | Admitting: Gastroenterology

## 2021-01-31 ENCOUNTER — Encounter: Payer: Self-pay | Admitting: Gastroenterology

## 2021-01-31 ENCOUNTER — Other Ambulatory Visit: Payer: Self-pay

## 2021-01-31 VITALS — BP 108/73 | HR 93 | Temp 98.0°F | Ht 62.0 in | Wt 148.4 lb

## 2021-01-31 DIAGNOSIS — K5904 Chronic idiopathic constipation: Secondary | ICD-10-CM

## 2021-01-31 DIAGNOSIS — R7989 Other specified abnormal findings of blood chemistry: Secondary | ICD-10-CM | POA: Diagnosis not present

## 2021-01-31 NOTE — Progress Notes (Signed)
Maria Repress, MD [address]  [address]  [place], [place] [address]  Main: [phone]  Fax: [phone]    Gastroenterology Consultation  Referring Provider:     [facility] Primary Care Physician:  [facility] Primary Gastroenterologist:  Dr. Arlyss [name] Reason for Consultation:     Chronic constipation, elevated LFTs        HPI:   Maria [name] is a 30 y.o. female referred by Dr. Blenda Nicely  for consultation & management of chronic constipation.  Patient reports that for more than a year, she has been experiencing irregular bowel habits, describes on Bristol stool scale as 1 or 2.  She was started on Linzess 72 MCG by her PCP who is now retired.  She reported experiencing diarrhea when she was taking Linzess daily with 3-4 bowel movements after she takes the pill.  Therefore, she is not taking it as needed only.  She does report abdominal bloating.  She is also experiencing perianal itching, pressure/swelling and reports that she notices blood on wiping once in a blue moon.  Her weight is stable.  She does acknowledge that she does not get enough fiber in her diet.  She manages to drink adequate water daily.  She denies abdominal pain, weight loss.  She is also noticed to have elevated transaminases, ALT 118, AST 43 in 03/2020.  Patient reports that she was taking excessive amounts of Tylenol for headache.  She is currently seeing a headache specialist and her headaches are better managed now.  She denies alcohol intake  Follow-up visit 01/31/2021 Crescentia is doing well with regards to her constipation.  She thinks Linzess is helping her with her symptoms.  She has also cut back on carbonated beverages, cheese.  She is no longer experiencing rectal pain.  She did not fill a prescription for nitroglycerin ointment as it was expensive.   NSAIDs: None  Antiplts/Anticoagulants/Anti thrombotics: None  GI Procedures: None She denies family  history of GI malignancy  Past Medical History:  Diagnosis Date   Acute pancreatitis    Asthma    GERD (gastroesophageal reflux disease)    Hemorrhoid    Hx of varicella    Ovarian cyst    Recurrent boils    legs and buttocks.  staff infection, was neg   Thrombocytosis 06/09/2016   UTI (urinary tract infection)     Past Surgical History:  Procedure Laterality Date   CESAREAN SECTION N/A 05/25/2016   Procedure: CESAREAN SECTION;  Surgeon: Gerald Leitz, MD;  Location: [facility];  Service: Obstetrics;  Laterality: N/A;   TONSILLECTOMY     WISDOM TOOTH EXTRACTION      Current Outpatient Medications:    cetirizine (ZYRTEC) 10 MG tablet, Take 1 tablet by mouth daily., Disp: , Rfl:    cyclobenzaprine (FLEXERIL) 5 MG tablet, Take 1 tablet by mouth as needed., Disp: , Rfl:    D3-50 1.25 MG (50000 UT) capsule, Take 50,000 Units by mouth once a week., Disp: , Rfl:    HYDROcodone-acetaminophen (NORCO/VICODIN) 5-325 MG tablet, Take 1 tablet by mouth every 4 (four) hours as needed for moderate pain., Disp: 12 tablet, Rfl: 0   ketoconazole (NIZORAL) 2 % shampoo, Apply 1 application topically 3 (three) times a week. Shampoo scalp 3 times weekly, let sit several minutes and rinse out, Disp: 120 mL, Rfl: 3   linaclotide (LINZESS) 72 MCG capsule, Take 72 mcg by mouth daily before breakfast., Disp: , Rfl:  medroxyPROGESTERone (DEPO-PROVERA) 150 MG/ML injection, Inject 150 mg into the muscle every 3 (three) months., Disp: , Rfl:    methocarbamol (ROBAXIN) 500 MG tablet, Take 1 tablet (500 mg total) by mouth 4 (four) times daily., Disp: 16 tablet, Rfl: 0   oxyCODONE-acetaminophen (PERCOCET/ROXICET) 5-325 MG tablet, Take by mouth every 4 (four) hours as needed for severe pain., Disp: , Rfl:    pantoprazole (PROTONIX) 40 MG tablet, Take 40 mg by mouth daily., Disp: , Rfl:    Sarecycline HCl (SEYSARA) 100 MG TABS, Take one tab po QD., Disp: 30 tablet, Rfl: 2   Family History  Problem Relation  Age of Onset   Hypertension Maternal Grandmother    Heart disease Maternal Grandfather    Stroke Maternal Grandfather    Diabetes Neg Hx    Cancer Neg Hx      Social History   Tobacco Use   Smoking status: Former    Packs/day: 0.50    Pack years: 0.00    Types: Cigarettes    Quit date: 11/07/2013    Years since quitting: 7.2   Smokeless tobacco: Former  Substance Use Topics   Alcohol use: Yes    Alcohol/week: 1.0 standard drink    Types: 1 Cans of beer per week    Comment: occasional not while preg   Drug use: No    Allergies as of 01/31/2021 - Review Complete 01/31/2021  Allergen Reaction Noted   Amoxicillin Rash 11/16/2020   Penicillins Hives and Other (See Comments) 09/08/2012    Review of Systems:    All systems reviewed and negative except where noted in HPI.   Physical Exam:  BP 108/73 (BP Location: Left Arm, Patient Position: Sitting, Cuff Size: Normal)   Pulse 93   Temp 98 F (36.7 C) (Oral)   Ht 5\' 2"  (1.575 m)   Wt 148 lb 6 oz (67.3 kg)   BMI 27.14 kg/m  No LMP recorded. Patient has had an injection.  General:   Alert,  Well-developed, well-nourished, pleasant and cooperative in NAD Head:  Normocephalic and atraumatic. Eyes:  Sclera clear, no icterus.   Conjunctiva pink. Ears:  Normal auditory acuity. Nose:  No deformity, discharge, or lesions. Mouth:  No deformity or lesions,oropharynx pink & moist. Neck:  Supple; no masses or thyromegaly. Lungs:  Respirations even and unlabored.  Clear throughout to auscultation.   No wheezes, crackles, or rhonchi. No acute distress. Heart:  Regular rate and rhythm; no murmurs, clicks, rubs, or gallops. Abdomen:  Normal bowel sounds. Soft, non-tender and non-distended without masses, hepatosplenomegaly or hernias noted.  No guarding or rebound tenderness.   Rectal: Not performed Msk:  Symmetrical without gross deformities. Good, equal movement & strength bilaterally. Pulses:  Normal pulses noted. Extremities:  No  clubbing or edema.  No cyanosis. Neurologic:  Alert and oriented x3;  grossly normal neurologically. Skin:  Intact without significant lesions or rashes. No jaundice. Psych:  Alert and cooperative. Normal mood and affect.  Imaging Studies: Reviewed  Assessment and Plan:   Breeona Waid is a 30 y.o. pleasant Caucasian female with remote history of acute pancreatitis thought to be secondary to alcohol use, chronic constipation and symptomatic external hemorrhoids  Chronic constipation Continue high-fiber diet, information provided Continue fiber supplements, handouts given Adequate intake of water Continue Linzess 72 MCG daily in order to regulate her bowel movements  Chronic posterior anal fissure: Currently resolved without using 0.125% nitroglycerin with 5% lidocaine,  Grade 1 symptomatic external hemorrhoids Not experiencing symptoms  at this time  Elevated LFTs: Likely secondary to fatty liver Recheck LFTs today, if persistently elevated and not improving, will proceed with secondary liver disease work-up and ultrasound liver   Follow up in 6 months   Maria Repress, MD

## 2021-02-01 ENCOUNTER — Telehealth: Payer: Self-pay

## 2021-02-01 LAB — HEPATIC FUNCTION PANEL
ALT: 24 IU/L (ref 0–32)
AST: 22 IU/L (ref 0–40)
Albumin: 4.7 g/dL (ref 3.8–4.8)
Alkaline Phosphatase: 67 IU/L (ref 44–121)
Bilirubin Total: 0.3 mg/dL (ref 0.0–1.2)
Bilirubin, Direct: <0.1 mg/dL (ref 0.00–0.40)
Total Protein: 6.6 g/dL (ref 6.0–8.5)

## 2021-02-01 NOTE — Telephone Encounter (Signed)
Sent mychart message

## 2021-02-01 NOTE — Telephone Encounter (Signed)
-----   Message from Toney Reil, MD sent at 02/01/2021  3:19 PM EDT ----- Maria [name] to see that her liver numbers are back to normal.  Continue healthy diet and exercise.  Recheck LFTs in 6 months to 1 year by her PCP  RV

## 2021-02-03 ENCOUNTER — Telehealth: Payer: Self-pay

## 2021-02-03 NOTE — Telephone Encounter (Signed)
LM please return my call, I was trying to reschedule this pts appt from July 20 to July 27 a day Dr Neale Burly and Dr Kirtland Bouchard will both be in the office to see this pt

## 2021-03-09 ENCOUNTER — Ambulatory Visit: Payer: Medicaid Other | Admitting: Dermatology

## 2021-03-16 ENCOUNTER — Other Ambulatory Visit: Payer: Self-pay

## 2021-03-16 ENCOUNTER — Ambulatory Visit (INDEPENDENT_AMBULATORY_CARE_PROVIDER_SITE_OTHER): Payer: Medicaid Other | Admitting: Dermatology

## 2021-03-16 DIAGNOSIS — L509 Urticaria, unspecified: Secondary | ICD-10-CM | POA: Diagnosis not present

## 2021-03-16 DIAGNOSIS — L739 Follicular disorder, unspecified: Secondary | ICD-10-CM

## 2021-03-16 DIAGNOSIS — L299 Pruritus, unspecified: Secondary | ICD-10-CM

## 2021-03-16 MED ORDER — MONTELUKAST SODIUM 10 MG PO TABS: 10.0000 mg | ORAL_TABLET | Freq: Every day | ORAL | 6 refills | Status: DC

## 2021-03-16 MED ORDER — DOXYCYCLINE HYCLATE 100 MG PO CAPS: 100.0000 mg | ORAL_CAPSULE | Freq: Every day | ORAL | 6 refills | Status: AC

## 2021-03-16 NOTE — Patient Instructions (Signed)

## 2021-03-16 NOTE — Progress Notes (Signed)
   Follow-Up Visit   Subjective  Maria [name] is a 30 y.o. female who presents for the following: Follow-up (Folliculitis/acne/Rosacea follow up - she has decided not to take isotretinoin. Her itching was improved until last night when she woke up scratching.).  The following portions of the chart were reviewed this encounter and updated as appropriate:   Tobacco  Allergies  Meds  Problems  Med Hx  Surg Hx  Fam Hx     Review of Systems:  No other skin or systemic complaints except as noted in HPI or Assessment and Plan.  Objective  Well appearing patient in no apparent distress; mood and affect are within normal limits.  A focused examination was performed including scalp, face. Relevant physical exam findings are noted in the Assessment and Plan.   Assessment & Plan  Folliculitis with pruritus of the scalp Scalp Folliculitis/Acne/Rosacea Chronic persistent Restart Doxycycline 100 mg 1 po qd with food and plenty of fluid Continue ketoconazole 2% shampoo 3 times per week  Rosacea is a chronic progressive skin condition usually affecting the face of adults, causing redness and/or acne bumps. It is treatable but not curable. It sometimes affects the eyes (ocular rosacea) as well. It may respond to topical and/or systemic medication and can flare with stress, sun exposure, alcohol, exercise and some foods.  Daily application of broad spectrum spf 30+ sunscreen to face is recommended to reduce flares.  doxycycline (VIBRAMYCIN) 100 MG capsule - Scalp Take 1 capsule (100 mg total) by mouth daily. With food and plenty of fluid  Related Medications ketoconazole (NIZORAL) 2 % shampoo Apply 1 application topically 3 (three) times a week. Shampoo scalp 3 times weekly, let sit several minutes and rinse out  Urticaria with pruritus especially of the scalp The patient wakes up scratching her scalp at night She recalls this is been a problem for many years. Scalp, face Chronic and  persistent Increase Allegra 180 mg 2 po qd Start Singulair 10 mg 1 po qhs May consider Xolair vs Dupixent vs Adbry in the future.  montelukast (SINGULAIR) 10 MG tablet - Scalp, face Take 1 tablet (10 mg total) by mouth at bedtime.  Return in about 6 weeks (around 04/27/2021).  I, Joanie Coddington, CMA, am acting as scribe for Armida Sans, MD . Documentation: I have reviewed the above documentation for accuracy and completeness, and I agree with the above.  Armida Sans, MD

## 2021-03-17 ENCOUNTER — Encounter: Payer: Self-pay | Admitting: Dermatology

## 2021-04-27 ENCOUNTER — Ambulatory Visit: Payer: Medicaid Other | Admitting: Dermatology

## 2021-06-03 ENCOUNTER — Other Ambulatory Visit (HOSPITAL_BASED_OUTPATIENT_CLINIC_OR_DEPARTMENT_OTHER): Payer: Self-pay

## 2021-06-03 DIAGNOSIS — R0681 Apnea, not elsewhere classified: Secondary | ICD-10-CM

## 2021-06-03 DIAGNOSIS — R42 Dizziness and giddiness: Secondary | ICD-10-CM

## 2021-06-03 DIAGNOSIS — R519 Headache, unspecified: Secondary | ICD-10-CM

## 2021-06-03 DIAGNOSIS — R0683 Snoring: Secondary | ICD-10-CM
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
## 2021-08-02 ENCOUNTER — Ambulatory Visit (INDEPENDENT_AMBULATORY_CARE_PROVIDER_SITE_OTHER): Payer: Medicaid Other | Admitting: Gastroenterology

## 2021-08-02 ENCOUNTER — Encounter: Payer: Self-pay | Admitting: Gastroenterology

## 2021-08-02 VITALS — BP 113/74 | HR 92 | Temp 98.4°F | Ht 62.0 in | Wt 148.2 lb

## 2021-08-02 DIAGNOSIS — R7989 Other specified abnormal findings of blood chemistry: Secondary | ICD-10-CM

## 2021-08-02 DIAGNOSIS — R5382 Chronic fatigue, unspecified: Secondary | ICD-10-CM

## 2021-08-02 NOTE — Progress Notes (Signed)
Cephas Darby, MD [address]  [place], [place] [address]  Main: [phone]  Fax: [phone]    Gastroenterology Consultation  Referring Provider:     [facility] Primary Care Physician:  [facility] Primary Gastroenterologist:  Dr. Cephas Darby Reason for Consultation:     Chronic constipation, elevated LFTs        HPI:   Maria [name] is a 30 y.o. female referred by Dr. Alcide Clever  for consultation & management of chronic constipation.  Patient reports that for more than a year, she has been experiencing irregular bowel habits, describes on Bristol stool scale as 1 or 2.  She was started on Linzess 72 MCG by her PCP who is now retired.  She reported experiencing diarrhea when she was taking Linzess daily with 3-4 bowel movements after she takes the pill.  Therefore, she is not taking it as needed only.  She does report abdominal bloating.  She is also experiencing perianal itching, pressure/swelling and reports that she notices blood on wiping once in a blue moon.  Her weight is stable.  She does acknowledge that she does not get enough fiber in her diet.  She manages to drink adequate water daily.  She denies abdominal pain, weight loss.  She is also noticed to have elevated transaminases, ALT 118, AST 43 in 03/2020.  Patient reports that she was taking excessive amounts of Tylenol for headache.  She is currently seeing a headache specialist and her headaches are better managed now.  She denies alcohol intake  Follow-up visit 01/31/2021 Maria [name] is doing well with regards to her constipation.  She thinks Linzess is helping her with her symptoms.  She has also cut back on carbonated beverages, cheese.  She is no longer experiencing rectal pain.  She did not fill a prescription for nitroglycerin ointment as it was expensive.   Follow-up visit 08/02/2021 Patient is doing well from GI standpoint.  She has a lot going on in her personal  life, stress at work, stress with her kids who has health issues, she is a single mom.  She is taking Linzess 72 MCG daily.  Her LFTs 6 months ago were normal.  NSAIDs: None  Antiplts/Anticoagulants/Anti thrombotics: None  GI Procedures: None She denies family history of GI malignancy  Past Medical History:  Diagnosis Date   Acute pancreatitis    Asthma    GERD (gastroesophageal reflux disease)    Hemorrhoid    Hx of varicella    Ovarian cyst    Recurrent boils    legs and buttocks.  staff infection, was neg   Thrombocytosis 06/09/2016   UTI (urinary tract infection)     Past Surgical History:  Procedure Laterality Date   CESAREAN SECTION N/A 05/25/2016   Procedure: CESAREAN SECTION;  Surgeon: Christophe Louis, MD;  Location: [place];  Service: Obstetrics;  Laterality: N/A;   TONSILLECTOMY     WISDOM TOOTH EXTRACTION      Current Outpatient Medications:    azithromycin (ZITHROMAX) 250 MG tablet, TAKE 2 TABLETS BY MOUTH TODAY, THEN TAKE 1 TABLET DAILY FOR 4 DAYS, Disp: , Rfl:    cetirizine (ZYRTEC) 10 MG tablet, Take 1 tablet by mouth daily., Disp: , Rfl:    cyclobenzaprine (FLEXERIL) 5 MG tablet, Take 1 tablet by mouth as needed., Disp: , Rfl:    D3-50 1.25 MG (50000 UT) capsule, Take 50,000 Units by mouth once a week., Disp: ,  Rfl:    doxycycline (VIBRAMYCIN) 100 MG capsule, Take 1 capsule (100 mg total) by mouth daily. With food and plenty of fluid, Disp: 30 capsule, Rfl: 6   HYDROcodone-acetaminophen (NORCO/VICODIN) 5-325 MG tablet, Take 1 tablet by mouth every 4 (four) hours as needed for moderate pain., Disp: 12 tablet, Rfl: 0   ketoconazole (NIZORAL) 2 % shampoo, Apply 1 application topically 3 (three) times a week. Shampoo scalp 3 times weekly, let sit several minutes and rinse out, Disp: 120 mL, Rfl: 3   linaclotide (LINZESS) 72 MCG capsule, Take 72 mcg by mouth daily before breakfast., Disp: , Rfl:    medroxyPROGESTERone (DEPO-PROVERA) 150 MG/ML injection, Inject  150 mg into the muscle every 3 (three) months., Disp: , Rfl:    methocarbamol (ROBAXIN) 500 MG tablet, Take 1 tablet (500 mg total) by mouth 4 (four) times daily., Disp: 16 tablet, Rfl: 0   montelukast (SINGULAIR) 10 MG tablet, Take 1 tablet (10 mg total) by mouth at bedtime., Disp: 30 tablet, Rfl: 6   oxyCODONE-acetaminophen (PERCOCET/ROXICET) 5-325 MG tablet, Take by mouth every 4 (four) hours as needed for severe pain., Disp: , Rfl:    pantoprazole (PROTONIX) 40 MG tablet, Take 40 mg by mouth daily., Disp: , Rfl:    Family History  Problem Relation Age of Onset   Hypertension Maternal Grandmother    Heart disease Maternal Grandfather    Stroke Maternal Grandfather    Diabetes Neg Hx    Cancer Neg Hx      Social History   Tobacco Use   Smoking status: Former    Packs/day: 0.50    Types: Cigarettes    Quit date: 11/07/2013    Years since quitting: 7.7   Smokeless tobacco: Former  Substance Use Topics   Alcohol use: Yes    Alcohol/week: 1.0 standard drink    Types: 1 Cans of beer per week    Comment: occasional not while preg   Drug use: No    Allergies as of 08/02/2021 - Review Complete 08/02/2021  Allergen Reaction Noted   Amoxicillin Rash 11/16/2020   Penicillins Hives and Other (See Comments) 09/08/2012    Review of Systems:    All systems reviewed and negative except where noted in HPI.   Physical Exam:  BP 113/74 (BP Location: Right Arm, Patient Position: Sitting, Cuff Size: Normal)    Pulse 92    Temp 98.4 F (36.9 C) (Oral)    Ht 5\' 2"  (1.575 m)    Wt 148 lb 3.2 oz (67.2 kg)    BMI 27.11 kg/m  No LMP recorded. Patient has had an injection.  General:   Alert,  Well-developed, well-nourished, pleasant and cooperative in NAD Head:  Normocephalic and atraumatic. Eyes:  Sclera clear, no icterus.   Conjunctiva pink. Ears:  Normal auditory acuity. Nose:  No deformity, discharge, or lesions. Mouth:  No deformity or lesions,oropharynx pink & moist. Neck:  Supple;  no masses or thyromegaly. Lungs:  Respirations even and unlabored.  Clear throughout to auscultation.   No wheezes, crackles, or rhonchi. No acute distress. Heart:  Regular rate and rhythm; no murmurs, clicks, rubs, or gallops. Abdomen:  Normal bowel sounds. Soft, non-tender and non-distended without masses, hepatosplenomegaly or hernias noted.  No guarding or rebound tenderness.   Rectal: Not performed Msk:  Symmetrical without gross deformities. Good, equal movement & strength bilaterally. Pulses:  Normal pulses noted. Extremities:  No clubbing or edema.  No cyanosis. Neurologic:  Alert and oriented x3;  grossly  normal neurologically. Skin:  Intact without significant lesions or rashes. No jaundice. Psych:  Alert and cooperative. Normal mood and affect.  Imaging Studies: Reviewed  Assessment and Plan:   Maria [name] is a 30 y.o. pleasant Caucasian female with remote history of acute pancreatitis thought to be secondary to alcohol use, chronic constipation and symptomatic external hemorrhoids  Chronic constipation Continue high-fiber diet Continue fiber supplements Adequate intake of water Continue Linzess 72 MCG daily in order to regulate her bowel movements  Chronic posterior anal fissure: Currently resolved without using 0.125% nitroglycerin with 5% lidocaine,  Grade 1 symptomatic external hemorrhoids Not experiencing symptoms at this time  Elevated LFTs: Likely secondary to fatty liver,  normal LFTs in 01/2021 Recheck LFTs today  Chronic fatigue Check CBC, iron panel, B12 and folate levels  Follow up as needed, contact via MyChart   Arlyss Repress, MD

## 2021-08-03 ENCOUNTER — Telehealth: Payer: Self-pay

## 2021-08-03 LAB — CBC
Hematocrit: 38.3 % (ref 34.0–46.6)
Hemoglobin: 13.4 g/dL (ref 11.1–15.9)
MCH: 31.7 pg (ref 26.6–33.0)
MCHC: 35 g/dL (ref 31.5–35.7)
MCV: 91 fL (ref 79–97)
Platelets: 244 10*3/uL (ref 150–450)
RBC: 4.23 x10E6/uL (ref 3.77–5.28)
RDW: 12.7 % (ref 11.7–15.4)
WBC: 5.6 10*3/uL (ref 3.4–10.8)

## 2021-08-03 LAB — IRON,TIBC AND FERRITIN PANEL
Ferritin: 118 ng/mL (ref 15–150)
Iron Saturation: 21 % (ref 15–55)
Iron: 83 ug/dL (ref 27–159)
Total Iron Binding Capacity: 393 ug/dL (ref 250–450)
UIBC: 310 ug/dL (ref 131–425)

## 2021-08-03 LAB — B12 AND FOLATE PANEL
Folate: 19.3 ng/mL (ref >3.0)
Vitamin B-12: 458 pg/mL (ref 232–1245)

## 2021-08-03 LAB — HEPATIC FUNCTION PANEL
ALT: 20 IU/L (ref 0–32)
AST: 21 IU/L (ref 0–40)
Albumin: 5.1 g/dL — ABNORMAL HIGH (ref 3.8–4.8)
Alkaline Phosphatase: 62 IU/L (ref 44–121)
Bilirubin Total: 0.4 mg/dL (ref 0.0–1.2)
Bilirubin, Direct: 0.1 mg/dL (ref 0.00–0.40)
Total Protein: 6.8 g/dL (ref 6.0–8.5)

## 2021-08-03 NOTE — Telephone Encounter (Signed)
Called patient she understands her labs results were normal

## 2021-08-03 NOTE — Telephone Encounter (Signed)
-----   Message from Toney Reil, MD sent at 08/03/2021  4:40 PM EST ----- Normal labs  RV

## 2021-09-05 ENCOUNTER — Other Ambulatory Visit: Payer: Self-pay

## 2021-09-16 ENCOUNTER — Ambulatory Visit (HOSPITAL_BASED_OUTPATIENT_CLINIC_OR_DEPARTMENT_OTHER): Payer: Medicaid Other | Attending: Family Medicine | Admitting: Internal Medicine

## 2021-09-16 ENCOUNTER — Other Ambulatory Visit: Payer: Self-pay

## 2021-09-16 DIAGNOSIS — R519 Headache, unspecified: Secondary | ICD-10-CM

## 2021-09-16 DIAGNOSIS — R0683 Snoring: Secondary | ICD-10-CM

## 2021-09-16 DIAGNOSIS — G4733 Obstructive sleep apnea (adult) (pediatric): Secondary | ICD-10-CM | POA: Diagnosis present

## 2021-09-16 DIAGNOSIS — R42 Dizziness and giddiness: Secondary | ICD-10-CM

## 2021-09-16 DIAGNOSIS — R0681 Apnea, not elsewhere classified: Secondary | ICD-10-CM

## 2021-09-24 DIAGNOSIS — R0683 Snoring: Secondary | ICD-10-CM | POA: Diagnosis not present

## 2021-09-24 NOTE — Procedures (Signed)
° ° ° °  Patient Name: Maria [name], Maria [name] Date: 09/16/2021 Gender: Female D.O.B: 10-Apr-1991 Age (years): 30 Referring Provider: Eber Jones NP Height (inches): 62 Interpreting Physician: Jetty Duhamel MD, ABSM Weight (lbs): 145 RPSGT: [place] BMI: 27 MRN: [id number] Neck Size: 14.00  CLINICAL INFORMATION Sleep Study Type: HST Indication for sleep study: OSA Epworth Sleepiness Score: 10  SLEEP STUDY TECHNIQUE A multi-channel overnight portable sleep study was performed. The channels recorded were: nasal airflow, thoracic respiratory movement, and oxygen saturation with a pulse oximetry. Snoring was also monitored.  MEDICATIONS Patient self administered medications include: none reported.  SLEEP ARCHITECTURE Patient was studied for 394 minutes. The sleep efficiency was 100.0 % and the patient was supine for 99.4%. The arousal index was 0.0 per hour.  RESPIRATORY PARAMETERS The overall AHI was 18.4 per hour, with a central apnea index of 0 per hour. The oxygen nadir was 91% during sleep.  CARDIAC DATA Mean heart rate during sleep was 75.3 bpm.  IMPRESSIONS - Moderate obstructive sleep apnea occurred during this study (AHI = 18.4/h). - The patient had minimal or no oxygen desaturation during the study (Min O2 = 91%) - Patient snored.  DIAGNOSIS - Obstructive Sleep Apnea (G47.33)  RECOMMENDATIONS - Suggest CPAP titration sleep study or autopap. Other options would be based on clinical judgment. - Be careful with alcohol, sedatives and other CNS depressants that may worsen sleep apnea and disrupt normal sleep architecture. - Sleep hygiene should be reviewed to assess factors that may improve sleep quality. - Weight management and regular exercise should be initiated or continued.  [Electronically signed] 09/24/2021 11:46 AM  Jetty Duhamel MD, ABSM Diplomate, American Board of Sleep Medicine   NPI: [id number]                         Jetty Duhamel Diplomate, American Board of Sleep Medicine  ELECTRONICALLY SIGNED ON:  09/24/2021, 11:44 AM [facility] PH: [phone]   FX: [phone] ACCREDITED BY THE AMERICAN ACADEMY OF SLEEP MEDICINE

## 2021-10-13 DIAGNOSIS — R569 Unspecified convulsions: Secondary | ICD-10-CM | POA: Insufficient documentation

## 2021-10-17 ENCOUNTER — Other Ambulatory Visit: Payer: Self-pay | Admitting: Family Medicine

## 2021-10-17 DIAGNOSIS — R10819 Abdominal tenderness, unspecified site: Secondary | ICD-10-CM

## 2021-10-17 DIAGNOSIS — R100 Acute abdomen: Secondary | ICD-10-CM

## 2021-10-26 ENCOUNTER — Ambulatory Visit
Admission: RE | Admit: 2021-10-26 | Discharge: 2021-10-26 | Disposition: A | Discharge status: Home / Self Care | Payer: Medicaid Other | Source: Ambulatory Visit | Attending: Family Medicine | Admitting: Family Medicine

## 2021-10-26 ENCOUNTER — Other Ambulatory Visit: Payer: Self-pay

## 2021-10-26 DIAGNOSIS — R100 Acute abdomen: Secondary | ICD-10-CM | POA: Insufficient documentation

## 2021-10-26 DIAGNOSIS — R10819 Abdominal tenderness, unspecified site: Secondary | ICD-10-CM | POA: Insufficient documentation

## 2021-11-01 ENCOUNTER — Other Ambulatory Visit: Payer: Self-pay

## 2021-11-02 ENCOUNTER — Encounter: Payer: Self-pay | Admitting: Gastroenterology

## 2021-11-02 ENCOUNTER — Other Ambulatory Visit: Payer: Self-pay

## 2021-11-02 ENCOUNTER — Ambulatory Visit (INDEPENDENT_AMBULATORY_CARE_PROVIDER_SITE_OTHER): Payer: Medicaid Other | Admitting: Gastroenterology

## 2021-11-02 ENCOUNTER — Telehealth: Payer: Self-pay | Admitting: Gastroenterology

## 2021-11-02 VITALS — BP 126/84 | HR 80 | Temp 98.3°F | Ht 62.0 in | Wt 156.1 lb

## 2021-11-02 DIAGNOSIS — K625 Hemorrhage of anus and rectum: Secondary | ICD-10-CM

## 2021-11-02 DIAGNOSIS — K5909 Other constipation: Secondary | ICD-10-CM | POA: Diagnosis not present

## 2021-11-02 NOTE — Progress Notes (Signed)
?  ?Arlyss Repress, MD ?[address]  ?[address]  ?[place], [place] [address]  ?Main: [phone]  ?Fax: [phone] ? ? ? ?Gastroenterology Consultation ? ?Referring Provider:     Eber Jones, NP ?Primary Care Physician:  [facility] ?Primary Gastroenterologist:  Dr. Arlyss Repress ?Reason for Consultation:     Chronic constipation, left-sided abdominal pain, rectal bleeding ?      ? HPI:   ?Maria [name] is a 31 y.o. female referred by Dr. Blenda Nicely  for consultation & management of chronic constipation.  Patient reports that for more than a year, she has been experiencing irregular bowel habits, describes on Bristol stool scale as 1 or 2.  She was started on Linzess 72 MCG by her PCP who is now retired.  She reported experiencing diarrhea when she was taking Linzess daily with 3-4 bowel movements after she takes the pill.  Therefore, she is not taking it as needed only.  She does report abdominal bloating.  She is also experiencing perianal itching, pressure/swelling and reports that she notices blood on wiping once in a blue moon.  Her weight is stable.  She does acknowledge that she does not get enough fiber in her diet.  She manages to drink adequate water daily.  She denies abdominal pain, weight loss.  She is also noticed to have elevated transaminases, ALT 118, AST 43 in 03/2020.  Patient reports that she was taking excessive amounts of Tylenol for headache.  She is currently seeing a headache specialist and her headaches are better managed now.  She denies alcohol intake ? ?Follow-up visit 01/31/2021 ?Ariabella is doing well with regards to her constipation.  She thinks Linzess is helping her with her symptoms.  She has also cut back on carbonated beverages, cheese.  She is no longer experiencing rectal pain.  She did not fill a prescription for nitroglycerin ointment as it was expensive.  ? ?Follow-up visit 08/02/2021 ?Patient is doing well from GI standpoint.  She has a lot  going on in her personal life, stress at work, stress with her kids who has health issues, she is a single mom.  She is taking Linzess 72 MCG daily.  Her LFTs 6 months ago were normal. ? ?Follow-up visit 11/02/2021 ?Patient made an urgent visit to see me to discuss about rectal bleeding.  Patient reports that about a month ago, she had an episode of bright red blood per rectum.  She was also experiencing left-sided abdominal pain associated with abdominal bloating and severe constipation.  Patient was evaluated by her OB/GYN about a week ago, underwent abdominal ultrasound which was unremarkable.  She was told that her symptoms are secondary to constipation, therefore made an appointment with me.  Apparently, patient is has not been taking Linzess daily.  She also states that whenever she takes Linzess, she moves her bowels well and feels well.  She is no longer experiencing rectal bleeding.  She was told about colorectal cancer by her neighbor and she got worried, made an appointment to discuss about colonoscopy.  Her abdominal pain has improved but not resolved.  She does have abdominal bloating.  Patient has gained about 10 pounds since last visit ? ?NSAIDs: None ? ?Antiplts/Anticoagulants/Anti thrombotics: None ? ?GI Procedures: None ?She denies family history of GI malignancy ? ?Past Medical History:  ?Diagnosis Date  ? Acute pancreatitis   ? Asthma   ? GERD (gastroesophageal reflux disease)   ? Hemorrhoid   ?  Hx of varicella   ? Ovarian cyst   ? Recurrent boils   ? legs and buttocks.  staff infection, was neg  ? Thrombocytosis 06/09/2016  ? UTI (urinary tract infection)   ? ? ?Past Surgical History:  ?Procedure Laterality Date  ? CESAREAN SECTION N/A 05/25/2016  ? Procedure: CESAREAN SECTION;  Surgeon: Gerald Leitzara Cole, MD;  [facility];  Service: Obstetrics;  Laterality: N/A;  ? TONSILLECTOMY    ? WISDOM TOOTH EXTRACTION    ? ? ?Current Outpatient Medications:  ?  albuterol (VENTOLIN HFA) 108 (90  Base) MCG/ACT inhaler, Ventolin HFA 90 mcg/actuation aerosol inhaler  INHALE 2 INHALATIONS INTO THE LUNGS EVERY 4 HOURS AS NEEDED, Disp: , Rfl:  ?  cetirizine (ZYRTEC) 10 MG tablet, Take 1 tablet by mouth daily., Disp: , Rfl:  ?  clonazePAM (KLONOPIN) 0.5 MG tablet, clonazepam 0.5 mg tablet  TAKE 1/2 TABLET BY MOUTH EVERY DAY FOR 1 WK THEN 1/2 TAB TWICE A DAY, Disp: , Rfl:  ?  cyclobenzaprine (FLEXERIL) 5 MG tablet, Take 1 tablet by mouth as needed., Disp: , Rfl:  ?  D3-50 1.25 MG (50000 UT) capsule, Take 50,000 Units by mouth once a week., Disp: , Rfl:  ?  DENTAGEL 1.1 % GEL dental gel, Place onto teeth daily., Disp: , Rfl:  ?  HYDROcodone-acetaminophen (NORCO/VICODIN) 5-325 MG tablet, Take 1 tablet by mouth every 4 (four) hours as needed for moderate pain., Disp: 12 tablet, Rfl: 0 ?  ketoconazole (NIZORAL) 2 % shampoo, Apply 1 application topically 3 (three) times a week. Shampoo scalp 3 times weekly, let sit several minutes and rinse out, Disp: 120 mL, Rfl: 3 ?  linaclotide (LINZESS) 72 MCG capsule, Take 72 mcg by mouth daily before breakfast., Disp: , Rfl:  ?  medroxyPROGESTERone Acetate 150 MG/ML SUSY, Inject 1 mL into the muscle every 3 (three) months., Disp: , Rfl:  ?  methocarbamol (ROBAXIN) 500 MG tablet, Take 1 tablet (500 mg total) by mouth 4 (four) times daily., Disp: 16 tablet, Rfl: 0 ?  montelukast (SINGULAIR) 10 MG tablet, Take 1 tablet (10 mg total) by mouth at bedtime., Disp: 30 tablet, Rfl: 6 ?  oxyCODONE-acetaminophen (PERCOCET/ROXICET) 5-325 MG tablet, Take by mouth every 4 (four) hours as needed for severe pain., Disp: , Rfl:  ?  pantoprazole (PROTONIX) 40 MG tablet, Take 40 mg by mouth daily., Disp: , Rfl:  ? ? ?Family History  ?Problem Relation Age of Onset  ? Hypertension Maternal Grandmother   ? Heart disease Maternal Grandfather   ? Stroke Maternal Grandfather   ? Diabetes Neg Hx   ? Cancer Neg Hx   ?  ? ?Social History  ? ?Tobacco Use  ? Smoking status: Former  ?  Packs/day: 0.50  ?   Types: Cigarettes  ?  Quit date: 11/07/2013  ?  Years since quitting: 7.9  ? Smokeless tobacco: Former  ?Substance Use Topics  ? Alcohol use: Yes  ?  Alcohol/week: 1.0 standard drink  ?  Types: 1 Cans of beer per week  ?  Comment: occasional not while preg  ? Drug use: No  ? ? ?Allergies as of 11/02/2021 - Review Complete 11/02/2021  ?Allergen Reaction Noted  ? Amoxicillin Rash 11/16/2020  ? Penicillins Hives and Other (See Comments) 09/08/2012  ? ? ?Review of Systems:    ?All systems reviewed and negative except where noted in HPI. ? ? Physical Exam:  ?BP 126/84 (BP Location: Left Arm, Patient Position: Sitting, Cuff Size:  Normal)   Pulse 80   Temp 98.3 ?F (36.8 ?C) (Oral)   Ht 5\' 2"  (1.575 m)   Wt 156 lb 2 oz (70.8 kg)   BMI 28.56 kg/m?  ?No LMP recorded. Patient has had an injection. ? ?General:   Alert,  Well-developed, well-nourished, pleasant and cooperative in NAD ?Head:  Normocephalic and atraumatic. ?Eyes:  Sclera clear, no icterus.   Conjunctiva pink. ?Ears:  Normal auditory acuity. ?Nose:  No deformity, discharge, or lesions. ?Mouth:  No deformity or lesions,oropharynx pink & moist. ?Neck:  Supple; no masses or thyromegaly. ?Lungs:  Respirations even and unlabored.  Clear throughout to auscultation.   No wheezes, crackles, or rhonchi. No acute distress. ?Heart:  Regular rate and rhythm; no murmurs, clicks, rubs, or gallops. ?Abdomen:  Normal bowel sounds. Soft, non-tender and mildly distended without masses, hepatosplenomegaly or hernias noted.  No guarding or rebound tenderness.   ?Rectal: Not performed ?Msk:  Symmetrical without gross deformities. Good, equal movement & strength bilaterally. ?Pulses:  Normal pulses noted. ?Extremities:  No clubbing or edema.  No cyanosis. ?Neurologic:  Alert and oriented x3;  grossly normal neurologically. ?Skin:  Intact without significant lesions or rashes. No jaundice. ?Psych:  Alert and cooperative. Normal mood and affect. ? ?Imaging  Studies: ?Reviewed ? ?Assessment and Plan:  ? ?Lolita Faulds is a 31 y.o. pleasant Caucasian female with remote history of acute pancreatitis thought to be secondary to alcohol use, chronic constipation and symptomatic external hemorr

## 2021-11-02 NOTE — Patient Instructions (Signed)
Dr. Allegra Lai wants you to have a Flexsigmoid the CPT code is [phone] and diagnosis code is K62.5 rectal bleeding. When you are ready to schedule this please call [place] at [phone] ?

## 2021-11-02 NOTE — Telephone Encounter (Signed)
Called and schedule patient for flex sigmoid. Went over instructions and mailed them  ?

## 2021-11-02 NOTE — Telephone Encounter (Signed)
Patient requesting call back to schedule colonoscopy. Patient also asking about a prior authorization for her procedure with her insurance. ?

## 2021-11-29 ENCOUNTER — Encounter: Payer: Self-pay | Admitting: Gastroenterology

## 2021-11-30 ENCOUNTER — Ambulatory Visit: Payer: Medicaid Other

## 2021-11-30 ENCOUNTER — Encounter
Admission: RE | Disposition: A | Discharge status: Home / Self Care | Payer: Self-pay | Source: Home / Self Care | Attending: Gastroenterology

## 2021-11-30 ENCOUNTER — Ambulatory Visit
Admission: RE | Admit: 2021-11-30 | Discharge: 2021-11-30 | Disposition: A | Discharge status: Home / Self Care | Payer: Medicaid Other | Attending: Gastroenterology | Admitting: Gastroenterology

## 2021-11-30 ENCOUNTER — Encounter: Payer: Self-pay | Admitting: Gastroenterology

## 2021-11-30 DIAGNOSIS — Z87891 Personal history of nicotine dependence: Secondary | ICD-10-CM | POA: Diagnosis not present

## 2021-11-30 DIAGNOSIS — Z8719 Personal history of other diseases of the digestive system: Secondary | ICD-10-CM | POA: Insufficient documentation

## 2021-11-30 DIAGNOSIS — K625 Hemorrhage of anus and rectum: Secondary | ICD-10-CM | POA: Insufficient documentation

## 2021-11-30 DIAGNOSIS — J45909 Unspecified asthma, uncomplicated: Secondary | ICD-10-CM | POA: Diagnosis not present

## 2021-11-30 DIAGNOSIS — Z8619 Personal history of other infectious and parasitic diseases: Secondary | ICD-10-CM | POA: Insufficient documentation

## 2021-11-30 DIAGNOSIS — K219 Gastro-esophageal reflux disease without esophagitis: Secondary | ICD-10-CM | POA: Insufficient documentation

## 2021-11-30 HISTORY — PX: FLEXIBLE SIGMOIDOSCOPY: SHX5431

## 2021-11-30 LAB — POCT PREGNANCY, URINE: Preg Test, Ur: NEGATIVE

## 2021-11-30 SURGERY — SIGMOIDOSCOPY, FLEXIBLE
Anesthesia: General

## 2021-11-30 MED ORDER — SODIUM CHLORIDE 0.9 % IV SOLN: INTRAVENOUS | Status: DC

## 2021-11-30 MED ORDER — LIDOCAINE HCL (CARDIAC) PF 100 MG/5ML IV SOSY
PREFILLED_SYRINGE | INTRAVENOUS | Status: DC | PRN
  Administered 2021-11-30: 50 mg via INTRAVENOUS

## 2021-11-30 MED ORDER — LINACLOTIDE 72 MCG PO CAPS: 72.0000 ug | ORAL_CAPSULE | Freq: Every day | ORAL | 3 refills | Status: DC

## 2021-11-30 MED ORDER — PROPOFOL 500 MG/50ML IV EMUL
INTRAVENOUS | Status: DC | PRN
  Administered 2021-11-30: 160 ug/kg/min via INTRAVENOUS

## 2021-11-30 MED ORDER — PROPOFOL 10 MG/ML IV BOLUS
INTRAVENOUS | Status: DC | PRN
  Administered 2021-11-30: 30 mg via INTRAVENOUS
  Administered 2021-11-30: 60 mg via INTRAVENOUS
  Administered 2021-11-30: 40 mg via INTRAVENOUS

## 2021-11-30 NOTE — Anesthesia Postprocedure Evaluation (Signed)
Anesthesia Post Note ? ?Patient: Maria [name] ? ?Procedure(s) Performed: FLEXIBLE SIGMOIDOSCOPY ? ?Patient location during evaluation: Endoscopy ?Anesthesia Type: General ?Level of consciousness: awake and alert ?Pain management: pain level controlled ?Vital Signs Assessment: post-procedure vital signs reviewed and stable ?Respiratory status: spontaneous breathing, nonlabored ventilation, respiratory function stable and patient connected to nasal cannula oxygen ?Cardiovascular status: blood pressure returned to baseline and stable ?Postop Assessment: no apparent nausea or vomiting ?Anesthetic complications: no ? ? ?No notable events documented. ? ? ?Last Vitals:  ?Vitals:  ? 11/30/21 0935 11/30/21 0954  ?BP: 92/67 109/87  ?Pulse: 83 72  ?Resp: 17   ?Temp:    ?SpO2: 99% 100%  ?  ?Last Pain:  ?Vitals:  ? 11/30/21 0954  ?TempSrc:   ?PainSc: 0-No pain  ? ? ?  ?  ?  ?  ?  ?  ? ?Martha Clan ? ? ? ? ?

## 2021-11-30 NOTE — Anesthesia Preprocedure Evaluation (Signed)
Anesthesia Evaluation  ?Patient identified by MRN, date of birth, ID band ?Patient awake ? ? ? ?Reviewed: ?Allergy & Precautions, H&P , NPO status , Patient's Chart, lab work & pertinent test results, reviewed documented beta blocker date and time  ? ?History of Anesthesia Complications ?Negative for: history of anesthetic complications ? ?Airway ?Mallampati: I ? ?TM Distance: >3 FB ?Neck ROM: full ? ? ? Dental ? ?(+) Dental Advidsory Given, Caps, Teeth Intact ?  ?Pulmonary ?neg shortness of breath, asthma , sleep apnea , neg recent URI, former smoker,  ?  ?Pulmonary exam normal ?breath sounds clear to auscultation ? ? ? ? ? ? Cardiovascular ?Exercise Tolerance: Good ?negative cardio ROS ?Normal cardiovascular exam ?Rhythm:regular Rate:Normal ? ? ?  ?Neuro/Psych ?negative neurological ROS ? negative psych ROS  ? GI/Hepatic ?Neg liver ROS, GERD  ,  ?Endo/Other  ?negative endocrine ROS ? Renal/GU ?negative Renal ROS  ?negative genitourinary ?  ?Musculoskeletal ? ? Abdominal ?  ?Peds ? Hematology ?negative hematology ROS ?(+)   ?Anesthesia Other Findings ?Past Medical History: ?No date: Acute pancreatitis ?No date: Asthma ?No date: GERD (gastroesophageal reflux disease) ?No date: Hemorrhoid ?No date: Hx of varicella ?No date: Ovarian cyst ?No date: Recurrent boils ?    Comment:  legs and buttocks.  staff infection, was neg ?06/09/2016: Thrombocytosis ?No date: UTI (urinary tract infection) ? ? Reproductive/Obstetrics ?negative OB ROS ? ?  ? ? ? ? ? ? ? ? ? ? ? ? ? ?  ?  ? ? ? ? ? ? ? ? ?Anesthesia Physical ?Anesthesia Plan ? ?ASA: 2 ? ?Anesthesia Plan: General  ? ?Post-op Pain Management:   ? ?Induction: Intravenous ? ?PONV Risk Score and Plan: 3 and Propofol infusion and TIVA ? ?Airway Management Planned: Natural Airway and Nasal Cannula ? ?Additional Equipment:  ? ?Intra-op Plan:  ? ?Post-operative Plan:  ? ?Informed Consent: I have reviewed the patients History and Physical, chart,  labs and discussed the procedure including the risks, benefits and alternatives for the proposed anesthesia with the patient or authorized representative who has indicated his/her understanding and acceptance.  ? ? ? ?Dental Advisory Given ? ?Plan Discussed with: Anesthesiologist, CRNA and Surgeon ? ?Anesthesia Plan Comments:   ? ? ? ? ? ? ?Anesthesia Quick Evaluation ? ?

## 2021-11-30 NOTE — H&P (Signed)
?Arlyss Repress, MD ?[address]  ?[address]  ?[place], [place] [address]  ?Main: [phone]  ?Fax: [phone] ?Pager: [phone] ? ?Primary Care Physician:  Retia Passe, NP ?Primary Gastroenterologist:  Dr. Arlyss Repress ? ?Pre-Procedure History & Physical: ?HPI:  Joylyn Duggin is a 31 y.o. female is here for an flexible sigmoidoscopy. ?  ?Past Medical History:  ?Diagnosis Date  ? Acute pancreatitis   ? Asthma   ? GERD (gastroesophageal reflux disease)   ? Hemorrhoid   ? Hx of varicella   ? Ovarian cyst   ? Recurrent boils   ? legs and buttocks.  staff infection, was neg  ? Thrombocytosis 06/09/2016  ? UTI (urinary tract infection)   ? ? ?Past Surgical History:  ?Procedure Laterality Date  ? CESAREAN SECTION N/A 05/25/2016  ? Procedure: CESAREAN SECTION;  Surgeon: Gerald Leitz, MD;  Location: [facility] SU[facility];  Service: Obstetrics;  Laterality: N/A;  ? TONSILLECTOMY    ? WISDOM TOOTH EXTRACTION    ? ? ?Prior to Admission medications   ?Medication Sig Start Date End Date Taking? Authorizing Provider  ?pantoprazole (PROTONIX) 40 MG tablet Take 40 mg by mouth daily. 05/01/20  Yes [provider]  ?albuterol (VENTOLIN HFA) 108 (90 Base) MCG/ACT inhaler Ventolin HFA 90 mcg/actuation aerosol inhaler ? INHALE 2 INHALATIONS INTO THE LUNGS EVERY 4 HOURS AS NEEDED 06/23/21   [provider]  ?cetirizine (ZYRTEC) 10 MG tablet Take 1 tablet by mouth daily. 12/23/19   [provider]  ?clonazePAM (KLONOPIN) 0.5 MG tablet clonazepam 0.5 mg tablet ? TAKE 1/2 TABLET BY MOUTH EVERY DAY FOR 1 WK THEN 1/2 TAB TWICE A DAY ?Patient not taking: Reported on 11/30/2021 08/01/21   [provider]  ?cyclobenzaprine (FLEXERIL) 5 MG tablet Take 1 tablet by mouth as needed. 06/30/19   [provider]  ?D3-50 1.25 MG (50000 UT) capsule Take 50,000 Units by mouth once a week. 12/23/19   [provider]  ?DENTAGEL 1.1 % GEL dental gel Place onto teeth daily. 10/13/21   [provider]  ?HYDROcodone-acetaminophen (NORCO/VICODIN) 5-325 MG tablet Take 1 tablet by mouth every 4 (four) hours as needed for moderate pain. 11/16/20   Cuthriell, Delorise Royals, PA-C  ?ketoconazole (NIZORAL) 2 % shampoo Apply 1 application topically 3 (three) times a week. Shampoo scalp 3 times weekly, let sit several minutes and rinse out 10/18/20   Deirdre Evener, MD  ?linaclotide [facility]) 72 MCG capsule Take 72 mcg by mouth daily before breakfast.    [provider]  ?medroxyPROGESTERone Acetate 150 MG/ML SUSY Inject 1 mL into the muscle every 3 (three) months. 10/19/21   [provider]  ?methocarbamol (ROBAXIN) 500 MG tablet Take 1 tablet (500 mg total) by mouth 4 (four) times daily. 11/16/20   Cuthriell, Delorise Royals, PA-C  ?montelukast (SINGULAIR) 10 MG tablet Take 1 tablet (10 mg total) by mouth at bedtime. 03/16/21   Deirdre Evener, MD  ?oxyCODONE-acetaminophen (PERCOCET/ROXICET) 5-325 MG tablet Take by mouth every 4 (four) hours as needed for severe pain.    [provider]  ? ? ?Allergies as of 11/03/2021 - Review Complete 11/02/2021  ?Allergen Reaction Noted  ? Amoxicillin Rash 11/16/2020  ? Penicillins Hives and Other (See Comments) 09/08/2012  ? ? ?Family History  ?Problem Relation Age of Onset  ? Hypertension Maternal Grandmother   ? Heart disease Maternal Grandfather   ? Stroke Maternal Grandfather   ? Diabetes Neg Hx   ?  Cancer Neg Hx   ? ? ?Social History  ? ?Socioeconomic History  ? Marital status: Divorced  ?  Spouse name: Not on file  ? Number of children: Not on file  ? Years of education: Not on file  ? Highest education level: Not on file  ?Occupational History  ? Not on file  ?Tobacco Use  ? Smoking status: Former  ?  Packs/day: 0.50  ?  Types: Cigarettes  ?  Quit date: 11/07/2013  ?  Years since quitting: 8.0  ? Smokeless tobacco: Former  ?Vaping Use  ? Vaping Use: Never used  ?Substance and Sexual Activity  ? Alcohol use: Yes  ?  Alcohol/week: 1.0 standard  drink  ?  Types: 1 Cans of beer per week  ?  Comment: occasional not while preg  ? Drug use: No  ? Sexual activity: Yes  ?  Birth control/protection: None  ?Other Topics Concern  ? Not on file  ?Social History Narrative  ? Not on file  ? ?Social Determinants of Health  ? ?Financial Resource Strain: Not on file  ?Food Insecurity: Not on file  ?Transportation Needs: Not on file  ?Physical Activity: Not on file  ?Stress: Not on file  ?Social Connections: Not on file  ?Intimate Partner Violence: Not on file  ? ? ?Review of Systems: ?See HPI, otherwise negative ROS ? ?Physical Exam: ?BP 115/88   Pulse 93   Temp (!) 97.3 ?F (36.3 ?C) (Temporal)   Resp 18   Ht 5\' 2"  (1.575 m)   Wt 68 kg   SpO2 100%   BMI 27.44 kg/m?  ?General:   Alert,  pleasant and cooperative in NAD ?Head:  Normocephalic and atraumatic. ?Neck:  Supple; no masses or thyromegaly. ?Lungs:  Clear throughout to auscultation.    ?Heart:  Regular rate and rhythm. ?Abdomen:  Soft, nontender and nondistended. Normal bowel sounds, without guarding, and without rebound.   ?Neurologic:  Alert and  oriented x4;  grossly normal neurologically. ? ?Impression/Plan: ?Kourtlyn Charlet is here for an flexible sigmoidoscopy to be performed for rectal bleeding ? ?Risks, benefits, limitations, and alternatives regarding  flexible sigmoidoscopy have been reviewed with the patient.  Questions have been answered.  All parties agreeable. ? ? ?[name], MD  11/30/2021, 9:15 AM ?

## 2021-11-30 NOTE — Anesthesia Procedure Notes (Signed)
Date/Time: 11/30/2021 9:18 AM ?Performed by: Joanette Gula, Ishmeal Rorie, CRNA ?Pre-anesthesia Checklist: Patient identified, Emergency Drugs available, Suction available, Patient being monitored and Timeout performed ?Patient Re-evaluated:Patient Re-evaluated prior to induction ?Oxygen Delivery Method: Simple face mask ?Induction Type: IV induction ? ? ? ? ?

## 2021-11-30 NOTE — Op Note (Signed)
[facility] ?Gastroenterology ?Patient Name: Maria [name] ?Procedure Date: 11/30/2021 9:09 AM ?MRN: [id number] ?Account #: [id number] ?Date of Birth: [birth year] ?Admit Type: Outpatient ?Age: 31 ?Room: [facility] ENDO ROOM 4 ?Gender: Female ?Note Status: Finalized ?Instrument Name: Upper Endoscope [id number] ?Procedure:             Flexible Sigmoidoscopy ?Indications:           Rectal hemorrhage ?Providers:             Lin Landsman MD, MD ?Referring MD:          Wellington Hampshire. Rose (Referring MD) ?Medicines:             General Anesthesia ?Complications:         No immediate complications. Estimated blood loss: None. ?Procedure:             Pre-Anesthesia Assessment: ?                       - Prior to the procedure, a History and Physical was  ?                       performed, and patient medications and allergies were  ?                       reviewed. The patient is competent. The risks and  ?                       benefits of the procedure and the sedation options and  ?                       risks were discussed with the patient. All questions  ?                       were answered and informed consent was obtained.  ?                       Patient identification and proposed procedure were  ?                       verified by the physician, the nurse, the  ?                       anesthesiologist, the anesthetist and the technician  ?                       in the pre-procedure area in the procedure room in the  ?                       endoscopy suite. Mental Status Examination: alert and  ?                       oriented. Airway Examination: normal oropharyngeal  ?                       airway and neck mobility. Respiratory Examination:  ?                       clear to auscultation. CV Examination: normal.  ?  Prophylactic Antibiotics: The patient does not require  ?                       prophylactic antibiotics. Prior Anticoagulants: The  ?                       patient has  taken no previous anticoagulant or  ?                       antiplatelet agents. ASA Grade Assessment: II - A  ?                       patient with mild systemic disease. After reviewing  ?                       the risks and benefits, the patient was deemed in  ?                       satisfactory condition to undergo the procedure. The  ?                       anesthesia plan was to use general anesthesia.  ?                       Immediately prior to administration of medications,  ?                       the patient was re-assessed for adequacy to receive  ?                       sedatives. The heart rate, respiratory rate, oxygen  ?                       saturations, blood pressure, adequacy of pulmonary  ?                       ventilation, and response to care were monitored  ?                       throughout the procedure. The physical status of the  ?                       patient was re-assessed after the procedure. ?                       After obtaining informed consent, the scope was passed  ?                       under direct vision. The Endoscope was introduced  ?                       through the anus and advanced to the the left  ?                       transverse colon. The flexible sigmoidoscopy was  ?                       accomplished without difficulty. The patient tolerated  ?  the procedure well. The quality of the bowel  ?                       preparation was adequate. ?Findings: ?     The perianal and digital rectal examinations were normal. Pertinent  ?     negatives include normal sphincter tone and no palpable rectal lesions. ?     The entire examined colon appeared normal. ?Impression:            - The entire examined colon is normal. ?                       - No specimens collected. ?Recommendation:        - Discharge patient to home (with escort). ?                       - Resume regular diet and high fiber diet today. ?                       - Return to my  office PRN. ?Procedure Code(s):     --- Professional --- ?                       [phone], Sigmoidoscopy, flexible; diagnostic, including  ?                       collection of specimen(s) by brushing or washing, when  ?                       performed (separate procedure) ?Diagnosis Code(s):     --- Professional --- ?                       K62.5, Hemorrhage of anus and rectum ?CPT copyright 2019 American Medical Association. All rights reserved. ?The codes documented in this report are preliminary and upon coder review may  ?be revised to meet current compliance requirements. ?Dr. Ulyess Mort ?Indie Nickerson Raeanne Gathers MD, MD ?11/30/2021 9:32:14 AM ?This report has been signed electronically. ?Number of Addenda: 0 ?Note Initiated On: 11/30/2021 9:09 AM ?Total Procedure Duration: 0 hours 3 minutes 22 seconds  ?Estimated Blood Loss:  Estimated blood loss: none. ?     [facility] ?

## 2021-11-30 NOTE — Transfer of Care (Signed)
Immediate Anesthesia Transfer of Care Note ? ?Patient: Maria [name] ? ?Procedure(s) Performed: FLEXIBLE SIGMOIDOSCOPY ? ?Patient Location: Endoscopy Unit ? ?Anesthesia Type:General ? ?Level of Consciousness: drowsy and patient cooperative ? ?Airway & Oxygen Therapy: Patient Spontanous Breathing ? ?Post-op Assessment: Report given to RN and Post -op Vital signs reviewed and stable ? ?Post vital signs: Reviewed and stable ? ?Last Vitals:  ?Vitals Value Taken Time  ?BP 92/67 11/30/21 0935  ?Temp 36.1 ?C 11/30/21 0934  ?Pulse 84 11/30/21 0935  ?Resp 18 11/30/21 0935  ?SpO2 98 % 11/30/21 0935  ?Vitals shown include unvalidated device data. ? ?Last Pain:  ?Vitals:  ? 11/30/21 0934  ?TempSrc: Temporal  ?   ? ?  ? ?Complications: No notable events documented. ?

## 2021-12-01 ENCOUNTER — Encounter: Payer: Self-pay | Admitting: Gastroenterology

## 2022-01-27 ENCOUNTER — Emergency Department: Payer: Medicaid Other

## 2022-01-27 ENCOUNTER — Other Ambulatory Visit: Payer: Self-pay

## 2022-01-27 ENCOUNTER — Encounter: Payer: Self-pay | Admitting: Emergency Medicine

## 2022-01-27 ENCOUNTER — Emergency Department
Admission: EM | Admit: 2022-01-27 | Discharge: 2022-01-27 | Disposition: A | Discharge status: Home / Self Care | Payer: Medicaid Other | Attending: Emergency Medicine | Admitting: Emergency Medicine

## 2022-01-27 DIAGNOSIS — J4521 Mild intermittent asthma with (acute) exacerbation: Secondary | ICD-10-CM | POA: Insufficient documentation

## 2022-01-27 DIAGNOSIS — Z20822 Contact with and (suspected) exposure to covid-19: Secondary | ICD-10-CM | POA: Diagnosis not present

## 2022-01-27 DIAGNOSIS — R059 Cough, unspecified: Secondary | ICD-10-CM | POA: Diagnosis present

## 2022-01-27 LAB — RESP PANEL BY RT-PCR (FLU A&B, COVID) ARPGX2
Influenza A by PCR: NEGATIVE
Influenza B by PCR: NEGATIVE
SARS Coronavirus 2 by RT PCR: NEGATIVE

## 2022-01-27 MED ORDER — ALBUTEROL SULFATE HFA 108 (90 BASE) MCG/ACT IN AERS: 2.0000 | INHALATION_SPRAY | Freq: Four times a day (QID) | RESPIRATORY_TRACT | 2 refills | Status: AC | PRN

## 2022-01-27 MED ORDER — PREDNISONE 10 MG PO TABS
ORAL_TABLET | ORAL | 0 refills | Status: DC
Start: 2022-01-27 — End: 2022-04-20

## 2022-01-27 NOTE — ED Triage Notes (Signed)
Pt to ED via POV with sore throat, cough, and chest congestion x 1 week.

## 2022-01-27 NOTE — Discharge Instructions (Signed)
Follow-up with your primary care provider if any continued problems or concerns.  Begin using the albuterol inhaler 2 puffs every 6 hours as needed for wheezing or shortness of breath.  The prednisone is 3 tablets once a day for the next 7 days.  You may take Tylenol if needed for throat pain, headache or body aches.  Increase fluids to stay hydrated.

## 2022-01-27 NOTE — ED Notes (Signed)
See triage note   presents with sore throat and cough  states cough is productive  green phlegm  also having hoarseness

## 2022-01-27 NOTE — ED Provider Notes (Signed)
[facility] Provider Note    Event Date/Time   First MD Initiated Contact with Patient 01/27/22 [phone]     (approximate)   History   Cough and Sore Throat   HPI  Maria [name] is a 31 y.o. female   presents to the ED with complaint of sore throat, congestion, cough for 1 week.  Patient denies any known COVID or influenza exposure.  Patient has had a history of asthma in the past but currently is not using any inhalers.  Patient has a history of acute pancreatitis, asthma, GERD, UTIs and thrombocytosis.      Physical Exam   Triage Vital Signs: ED Triage Vitals  Enc Vitals Group     BP 01/27/22 0837 (!) 143/88     Pulse Rate 01/27/22 0837 100     Resp 01/27/22 0837 18     Temp 01/27/22 0837 97.7 F (36.5 C)     Temp Source 01/27/22 0837 Oral     SpO2 01/27/22 0837 98 %     Weight 01/27/22 0837 149 lb 14.6 oz (68 kg)     Height 01/27/22 0837 5\' 2"  (1.575 m)     Head Circumference --      Peak Flow --      Pain Score 01/27/22 0833 8     Pain Loc --      Pain Edu? --      Excl. in GC? --     Most recent vital signs: Vitals:   01/27/22 0837  BP: (!) 143/88  Pulse: 100  Resp: 18  Temp: 97.7 F (36.5 C)  SpO2: 98%     General: Awake, no distress.  Able to talk in complete sentences without any difficulty. CV:  Good peripheral perfusion.  Heart regular rate and rhythm. Resp:  Normal effort.  Lungs are clear but congested cough is heard frequently. Abd:  No distention.  Other:     ED Results / Procedures / Treatments   Labs (all labs ordered are listed, but only abnormal results are displayed) Labs Reviewed  RESP PANEL BY RT-PCR (FLU A&B, COVID) [id number]      RADIOLOGY  X-ray was reviewed and interpreted by me independent of the radiologist with out infiltrates noted.  Radiology report is suggestive of a viral process versus reactive airway disease.   PROCEDURES:  Critical Care performed:   Procedures   MEDICATIONS  ORDERED IN ED: Medications - No data to display   IMPRESSION / MDM / ASSESSMENT AND PLAN / ED COURSE  I reviewed the triage vital signs and the nursing notes.   Differential diagnosis includes, but is not limited to, upper respiratory infection, influenza, COVID, acute observation of asthma, bronchitis, pneumonia.  31 year old female presents to the ED with complaint of sore throat, cough, chest congestion for 1 week.  Patient has a history of asthma but has not had an inhaler for some time.  COVID and influenza test were negative and reassuring.  Chest x-ray shows findings consistent with a viral process versus reactive airway disease.  With patient having history of asthma will treat with prednisone 30 mg daily for 7 days and add a prescription for her albuterol inhaler with 2 refills until she can see her primary care provider for continued prescriptions.  Patient is encouraged to use this as needed.  A note for work was given and patient was encouraged to continue with fluids and Tylenol if needed.      Patient's presentation  is most consistent with acute complicated illness / injury requiring diagnostic workup.  FINAL CLINICAL IMPRESSION(S) / ED DIAGNOSES   Final diagnoses:  Mild intermittent reactive airway disease with acute exacerbation     Rx / DC Orders   ED Discharge Orders          Ordered    albuterol (VENTOLIN HFA) 108 (90 Base) MCG/ACT inhaler  Every 6 hours PRN        01/27/22 1110    predniSONE (DELTASONE) 10 MG tablet        01/27/22 1110             Note:  This document was prepared using Dragon voice recognition software and may include unintentional dictation errors.   Tommi Rumps, PA-C 01/27/22 1117    Minna Antis, MD 01/27/22 1414

## 2022-01-31 ENCOUNTER — Other Ambulatory Visit: Payer: Self-pay | Admitting: Dermatology

## 2022-02-02 ENCOUNTER — Ambulatory Visit: Payer: Medicaid Other | Admitting: Dermatology

## 2022-02-02 DIAGNOSIS — L219 Seborrheic dermatitis, unspecified: Secondary | ICD-10-CM

## 2022-02-02 DIAGNOSIS — L858 Other specified epidermal thickening: Secondary | ICD-10-CM

## 2022-02-02 DIAGNOSIS — L2081 Atopic neurodermatitis: Secondary | ICD-10-CM

## 2022-02-02 DIAGNOSIS — L299 Pruritus, unspecified: Secondary | ICD-10-CM

## 2022-02-02 DIAGNOSIS — L509 Urticaria, unspecified: Secondary | ICD-10-CM

## 2022-02-02 DIAGNOSIS — Z79899 Other long term (current) drug therapy: Secondary | ICD-10-CM

## 2022-02-02 DIAGNOSIS — L853 Xerosis cutis: Secondary | ICD-10-CM

## 2022-02-02 DIAGNOSIS — L739 Follicular disorder, unspecified: Secondary | ICD-10-CM | POA: Diagnosis not present

## 2022-02-02 DIAGNOSIS — R61 Generalized hyperhidrosis: Secondary | ICD-10-CM

## 2022-02-02 MED ORDER — DUPILUMAB 300 MG/2ML ~~LOC~~ SOSY
600.0000 mg | PREFILLED_SYRINGE | Freq: Once | SUBCUTANEOUS | Status: AC
  Administered 2022-02-02: 600 mg via SUBCUTANEOUS

## 2022-02-02 MED ORDER — KETOCONAZOLE 2 % EX SHAM: 1.0000 | MEDICATED_SHAMPOO | CUTANEOUS | 11 refills | Status: DC

## 2022-02-02 MED ORDER — DUPIXENT 300 MG/2ML ~~LOC~~ SOSY: 300.0000 mg | PREFILLED_SYRINGE | SUBCUTANEOUS | 6 refills | Status: DC

## 2022-02-02 NOTE — Progress Notes (Unsigned)
Follow-Up Visit   Subjective  Maria [name] is a 31 y.o. female who presents for the following: Foliculitis/Acne/Rosacea with pruritis (Scalp, Ketoconazole 2% shampoo qd, ~1 yr f/u, itchy, worse at night, and when sweating, pt feels like spreading on face, pt last oral medication was Minocycline, she thinks that works better then doxycycline), pruritis (R foot, ~16m, only happens when she wears socks), and hx of Urticaria with Pruritus especially of the scalp (Scalp, pt takes Cetirizine and Singulair).  The following portions of the chart were reviewed this encounter and updated as appropriate:   Tobacco  Allergies  Meds  Problems  Med Hx  Surg Hx  Fam Hx     Review of Systems:  No other skin or systemic complaints except as noted in HPI or Assessment and Plan.  Objective  Well appearing patient in no apparent distress; mood and affect are within normal limits.  A focused examination was performed including face, scalp, right foot. Relevant physical exam findings are noted in the Assessment and Plan.  Scalp Minimal scale scalp  eyebrow, nasal labials Pink patches with greasy scale.   body Dermatographism elicited on face and thigh  arms, legs Keratosis pilaris on arms, xerosis arms, legs  Scalp Increased sweating in scalp   Assessment & Plan   Xerosis - diffuse xerotic patches - recommend gentle, hydrating skin care - gentle skin care handout given   Folliculitis Scalp Acne/Rosacea with Pruritis  Chronic and persistent condition with duration or expected duration over one year. Condition is bothersome/symptomatic for patient. Currently flared.   Discussed Isotretinoin, pt declines for now, she is currently on Depo shot  Cont Ketoconazole 2% shampoo qd prn, let sit 5 minutes and rinse out Will not restart Doxycycline or Minocycline for now.  Pt did think Minocycline worked better for her.  Related Medications ketoconazole (NIZORAL) 2 % shampoo Apply 1  Application topically as directed. Shampoo scalp and face, avoid eye,  at least 3 times weekly, let sit several minutes and rinse out  Seborrheic dermatitis eyebrow, nasal labials  Seborrheic Dermatitis  -  is a chronic persistent rash characterized by pinkness and scaling most commonly of the mid face but also can occur on the scalp (dandruff), ears; mid chest, mid back and groin.  It tends to be exacerbated by stress and cooler weather.  People who have neurologic disease may experience new onset or exacerbation of existing seborrheic dermatitis.  The condition is not curable but treatable and can be controlled.  Start Ketoconazole 2% shampoo to face qd, let sit 5 minutes and rinse off  Urticaria Body and Scalp With Pruritis - especially of scalp - wakes pt at night with itch  Urticaria or hives is a pink to red patchy whelp- like rash of the skin that typically itches and it is the result of histamine release in the skin.   Hives may have multiple causes including stress, medications, infections, and systemic illness.  Sometimes there is a family history of chronic urticaria.   "Physical urticarias" may be caused by heat, sun, cold, vibration.   Insect bites can cause "papular urticaria". It is often difficult to find the cause of generalized hives.  Statistically, 70% of the time a cause of generalized hives is not found.  Sometimes hives can spontaneously resolve. Other times hives can persist and when it does, and no cause is found, and it has been at least 6 weeks since started, it is called "chronic idiopathic urticaria".  Discussed Xolair  Cont Singulair qd Cont Certirizine qd  Related Medications montelukast (SINGULAIR) 10 MG tablet Take 1 tablet (10 mg total) by mouth at bedtime.  Atopic neurodermatitis arms, legs With Pruritis - Pruritus involving 15% BSA (at least) Atopic dermatitis (eczema) is a chronic, relapsing, pruritic condition that can significantly affect  quality of life. It is often associated with allergic rhinitis and/or asthma and can require treatment with topical medications, phototherapy, or in severe cases biologic injectable medication (Dupixent; Adbry) or Oral JAK inhibitors.   Discussed Dupixent, Adbry, information on Dupixent given to pt  Start Dupixent 300mg /19ml sq injections today, loading dose of 2 shots today.  Dupixent 300mg /40ml sq injections to R and L upper arm today, Lot  Dupilumab (Dupixent) is a treatment given by injection for adults and children with moderate-to-severe atopic dermatitis. Goal is control of skin condition, not cure. It is given as 2 injections at the first dose followed by 1 injection ever 2 weeks thereafter.  Young children are dosed monthly.  Potential side effects include allergic reaction, herpes infections, injection site reactions and conjunctivitis (inflammation of the eyes).  The use of Dupixent requires long term medication management, including periodic office visits.   dupilumab (DUPIXENT) prefilled syringe 600 mg - arms, legs  dupilumab (DUPIXENT) 300 MG/2ML prefilled syringe - arms, legs Inject 300 mg into the skin every 14 (fourteen) days. Starting at day 15 for maintenance.  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 Genetic in nature. No cure. - Observe. - If desired, patient can use an emollient (moisturizer) containing ammonium lactate, urea or salicylic acid once a day to smooth the area  - bil arms  Return in about 2 weeks (around 02/16/2022) for Nurse, Atopic Derm, 4 wks Dr. [id number].  I, 02/18/2022, RMA, am acting as scribe for Gwen Pounds, MD . Documentation: I have reviewed the above documentation for accuracy and completeness, and I agree with the above.  Ardis Rowan, MD

## 2022-02-02 NOTE — Patient Instructions (Signed)
Due to recent changes in healthcare laws, you may see results of your pathology and/or laboratory studies on MyChart before the doctors have had a chance to review them. We understand that in some cases there may be results that are confusing or concerning to you. Please understand that not all results are received at the same time and often the doctors may need to interpret multiple results in order to provide you with the best plan of care or course of treatment. Therefore, we ask that you please give us 2 business days to thoroughly review all your results before contacting the office for clarification. Should we see a critical lab result, you will be contacted sooner.   If You Need Anything After Your Visit  If you have any questions or concerns for your doctor, please call our main line at [phone] and press option 4 to reach your doctor's medical assistant. If no one answers, please leave a voicemail as directed and we will return your call as soon as possible. Messages left after 4 pm will be answered the following business day.   You may also send us a message via MyChart. We typically respond to MyChart messages within 1-2 business days.  For prescription refills, please ask your pharmacy to contact our office. Our fax number is [phone].  If you have an urgent issue when the clinic is closed that cannot wait until the next business day, you can page your doctor at the number below.    Please note that while we do our best to be available for urgent issues outside of office hours, we are not available 24/7.   If you have an urgent issue and are unable to reach us, you may choose to seek medical care at your doctor's office, retail clinic, urgent care center, or emergency room.  If you have a medical emergency, please immediately call 911 or go to the emergency department.  Pager Numbers  - Dr. Kowalski: [phone]  - Dr. Moye: [phone]  - Dr. Stewart:  [phone]  In the event of inclement weather, please call our main line at [phone] for an update on the status of any delays or closures.  Dermatology Medication Tips: Please keep the boxes that topical medications come in in order to help keep track of the instructions about where and how to use these. Pharmacies typically print the medication instructions only on the boxes and not directly on the medication tubes.   If your medication is too expensive, please contact our office at [phone] option 4 or send us a message through MyChart.   We are unable to tell what your co-pay for medications will be in advance as this is different depending on your insurance coverage. However, we may be able to find a substitute medication at lower cost or fill out paperwork to get insurance to cover a needed medication.   If a prior authorization is required to get your medication covered by your insurance company, please allow us 1-2 business days to complete this process.  Drug prices often vary depending on where the prescription is filled and some pharmacies may offer cheaper prices.  The website www.goodrx.com contains coupons for medications through different pharmacies. The prices here do not account for what the cost may be with help from insurance (it may be cheaper with your insurance), but the website can give you the price if you did not use any insurance.  - You can print the associated coupon and take it with   your prescription to the pharmacy.  - You may also stop by our office during regular business hours and pick up a GoodRx coupon card.  - If you need your prescription sent electronically to a different pharmacy, notify our office through [facility] MyChart or by phone at [phone] option 4.     Si Usted Necesita Algo Despus de Su Visita  Tambin puede enviarnos un mensaje a travs de MyChart. Por lo general respondemos a los mensajes de MyChart en el transcurso de 1 a 2  das hbiles.  Para renovar recetas, por favor pida a su farmacia que se ponga en contacto con nuestra oficina. Nuestro nmero de fax es el [phone].  Si tiene un asunto urgente cuando la clnica est cerrada y que no puede esperar hasta el siguiente da hbil, puede llamar/localizar a su doctor(a) al nmero que aparece a continuacin.   Por favor, tenga en cuenta que aunque hacemos todo lo posible para estar disponibles para asuntos urgentes fuera del horario de oficina, no estamos disponibles las 24 horas del da, los 7 das de la semana.   Si tiene un problema urgente y no puede comunicarse con nosotros, puede optar por buscar atencin mdica  en el consultorio de su doctor(a), en una clnica privada, en un centro de atencin urgente o en una sala de emergencias.  Si tiene una emergencia mdica, por favor llame inmediatamente al 911 o vaya a la sala de emergencias.  Nmeros de bper  - Dr. Kowalski: [phone]  - Dra. Moye: [phone]  - Dra. Stewart: [phone]  En caso de inclemencias del tiempo, por favor llame a nuestra lnea principal al [phone] para una actualizacin sobre el estado de cualquier retraso o cierre.  Consejos para la medicacin en dermatologa: Por favor, guarde las cajas en las que vienen los medicamentos de uso tpico para ayudarle a seguir las instrucciones sobre dnde y cmo usarlos. Las farmacias generalmente imprimen las instrucciones del medicamento slo en las cajas y no directamente en los tubos del medicamento.   Si su medicamento es muy caro, por favor, pngase en contacto con nuestra oficina llamando al [phone] y presione la opcin 4 o envenos un mensaje a travs de MyChart.   No podemos decirle cul ser su copago por los medicamentos por adelantado ya que esto es diferente dependiendo de la cobertura de su seguro. Sin embargo, es posible que podamos encontrar un medicamento sustituto a menor costo o llenar un formulario para que el  seguro cubra el medicamento que se considera necesario.   Si se requiere una autorizacin previa para que su compaa de seguros cubra su medicamento, por favor permtanos de 1 a 2 das hbiles para completar este proceso.  Los precios de los medicamentos varan con frecuencia dependiendo del lugar de dnde se surte la receta y alguna farmacias pueden ofrecer precios ms baratos.  El sitio web www.goodrx.com tiene cupones para medicamentos de diferentes farmacias. Los precios aqu no tienen en cuenta lo que podra costar con la ayuda del seguro (puede ser ms barato con su seguro), pero el sitio web puede darle el precio si no utiliz ningn seguro.  - Puede imprimir el cupn correspondiente y llevarlo con su receta a la farmacia.  - Tambin puede pasar por nuestra oficina durante el horario de atencin regular y recoger una tarjeta de cupones de GoodRx.  - Si necesita que su receta se enve electrnicamente a una farmacia diferente, informe a nuestra oficina a travs de MyChart de [place]   o por telfono llamando al [phone] y presione la opcin 4.  

## 2022-02-07 ENCOUNTER — Encounter: Payer: Self-pay | Admitting: Dermatology

## 2022-02-16 ENCOUNTER — Ambulatory Visit (INDEPENDENT_AMBULATORY_CARE_PROVIDER_SITE_OTHER): Payer: Medicaid Other | Admitting: Dermatology

## 2022-02-16 DIAGNOSIS — L209 Atopic dermatitis, unspecified: Secondary | ICD-10-CM | POA: Diagnosis not present

## 2022-02-16 MED ORDER — DUPILUMAB 300 MG/2ML ~~LOC~~ SOSY
300.0000 mg | PREFILLED_SYRINGE | Freq: Once | SUBCUTANEOUS | Status: AC
  Administered 2022-02-16: 300 mg via SUBCUTANEOUS

## 2022-02-16 NOTE — Progress Notes (Signed)
Patient here today for two week Dupixent injection for severe atopic dermatitis.  Dupixent 300mg /62mL syringe injected into left arm. Patient tolerated well.   LOT: 3m EXP: 05/2024  06/2024, RMA  Documentation: I have reviewed the above documentation for accuracy and completeness, and I agree with the above.  Dorathy Daft, MD

## 2022-02-21 ENCOUNTER — Encounter: Payer: Self-pay | Admitting: Dermatology

## 2022-03-02 ENCOUNTER — Ambulatory Visit (INDEPENDENT_AMBULATORY_CARE_PROVIDER_SITE_OTHER): Payer: Medicaid Other | Admitting: Dermatology

## 2022-03-02 DIAGNOSIS — L739 Follicular disorder, unspecified: Secondary | ICD-10-CM

## 2022-03-02 DIAGNOSIS — Z79899 Other long term (current) drug therapy: Secondary | ICD-10-CM | POA: Diagnosis not present

## 2022-03-02 DIAGNOSIS — L299 Pruritus, unspecified: Secondary | ICD-10-CM | POA: Diagnosis not present

## 2022-03-02 DIAGNOSIS — L2081 Atopic neurodermatitis: Secondary | ICD-10-CM

## 2022-03-02 MED ORDER — DUPILUMAB 300 MG/2ML ~~LOC~~ SOSY
300.0000 mg | PREFILLED_SYRINGE | Freq: Once | SUBCUTANEOUS | Status: AC
  Administered 2022-03-02: 300 mg via SUBCUTANEOUS

## 2022-03-02 NOTE — Patient Instructions (Signed)
Due to recent changes in healthcare laws, you may see results of your pathology and/or laboratory studies on MyChart before the doctors have had a chance to review them. We understand that in some cases there may be results that are confusing or concerning to you. Please understand that not all results are received at the same time and often the doctors may need to interpret multiple results in order to provide you with the best plan of care or course of treatment. Therefore, we ask that you please give us 2 business days to thoroughly review all your results before contacting the office for clarification. Should we see a critical lab result, you will be contacted sooner.   If You Need Anything After Your Visit  If you have any questions or concerns for your doctor, please call our main line at [phone] and press option 4 to reach your doctor's medical assistant. If no one answers, please leave a voicemail as directed and we will return your call as soon as possible. Messages left after 4 pm will be answered the following business day.   You may also send us a message via MyChart. We typically respond to MyChart messages within 1-2 business days.  For prescription refills, please ask your pharmacy to contact our office. Our fax number is [phone].  If you have an urgent issue when the clinic is closed that cannot wait until the next business day, you can page your doctor at the number below.    Please note that while we do our best to be available for urgent issues outside of office hours, we are not available 24/7.   If you have an urgent issue and are unable to reach us, you may choose to seek medical care at your doctor's office, retail clinic, urgent care center, or emergency room.  If you have a medical emergency, please immediately call 911 or go to the emergency department.  Pager Numbers  - Dr. Kowalski: [phone]  - Dr. Moye: [phone]  - Dr. Stewart:  [phone]  In the event of inclement weather, please call our main line at [phone] for an update on the status of any delays or closures.  Dermatology Medication Tips: Please keep the boxes that topical medications come in in order to help keep track of the instructions about where and how to use these. Pharmacies typically print the medication instructions only on the boxes and not directly on the medication tubes.   If your medication is too expensive, please contact our office at [phone] option 4 or send us a message through MyChart.   We are unable to tell what your co-pay for medications will be in advance as this is different depending on your insurance coverage. However, we may be able to find a substitute medication at lower cost or fill out paperwork to get insurance to cover a needed medication.   If a prior authorization is required to get your medication covered by your insurance company, please allow us 1-2 business days to complete this process.  Drug prices often vary depending on where the prescription is filled and some pharmacies may offer cheaper prices.  The website www.goodrx.com contains coupons for medications through different pharmacies. The prices here do not account for what the cost may be with help from insurance (it may be cheaper with your insurance), but the website can give you the price if you did not use any insurance.  - You can print the associated coupon and take it with   your prescription to the pharmacy.  - You may also stop by our office during regular business hours and pick up a GoodRx coupon card.  - If you need your prescription sent electronically to a different pharmacy, notify our office through [facility] MyChart or by phone at [phone] option 4.     Si Usted Necesita Algo Despus de Su Visita  Tambin puede enviarnos un mensaje a travs de MyChart. Por lo general respondemos a los mensajes de MyChart en el transcurso de 1 a 2  das hbiles.  Para renovar recetas, por favor pida a su farmacia que se ponga en contacto con nuestra oficina. Nuestro nmero de fax es el [phone].  Si tiene un asunto urgente cuando la clnica est cerrada y que no puede esperar hasta el siguiente da hbil, puede llamar/localizar a su doctor(a) al nmero que aparece a continuacin.   Por favor, tenga en cuenta que aunque hacemos todo lo posible para estar disponibles para asuntos urgentes fuera del horario de oficina, no estamos disponibles las 24 horas del da, los 7 das de la semana.   Si tiene un problema urgente y no puede comunicarse con nosotros, puede optar por buscar atencin mdica  en el consultorio de su doctor(a), en una clnica privada, en un centro de atencin urgente o en una sala de emergencias.  Si tiene una emergencia mdica, por favor llame inmediatamente al 911 o vaya a la sala de emergencias.  Nmeros de bper  - Dr. Kowalski: [phone]  - Dra. Moye: [phone]  - Dra. Stewart: [phone]  En caso de inclemencias del tiempo, por favor llame a nuestra lnea principal al [phone] para una actualizacin sobre el estado de cualquier retraso o cierre.  Consejos para la medicacin en dermatologa: Por favor, guarde las cajas en las que vienen los medicamentos de uso tpico para ayudarle a seguir las instrucciones sobre dnde y cmo usarlos. Las farmacias generalmente imprimen las instrucciones del medicamento slo en las cajas y no directamente en los tubos del medicamento.   Si su medicamento es muy caro, por favor, pngase en contacto con nuestra oficina llamando al [phone] y presione la opcin 4 o envenos un mensaje a travs de MyChart.   No podemos decirle cul ser su copago por los medicamentos por adelantado ya que esto es diferente dependiendo de la cobertura de su seguro. Sin embargo, es posible que podamos encontrar un medicamento sustituto a menor costo o llenar un formulario para que el  seguro cubra el medicamento que se considera necesario.   Si se requiere una autorizacin previa para que su compaa de seguros cubra su medicamento, por favor permtanos de 1 a 2 das hbiles para completar este proceso.  Los precios de los medicamentos varan con frecuencia dependiendo del lugar de dnde se surte la receta y alguna farmacias pueden ofrecer precios ms baratos.  El sitio web www.goodrx.com tiene cupones para medicamentos de diferentes farmacias. Los precios aqu no tienen en cuenta lo que podra costar con la ayuda del seguro (puede ser ms barato con su seguro), pero el sitio web puede darle el precio si no utiliz ningn seguro.  - Puede imprimir el cupn correspondiente y llevarlo con su receta a la farmacia.  - Tambin puede pasar por nuestra oficina durante el horario de atencin regular y recoger una tarjeta de cupones de GoodRx.  - Si necesita que su receta se enve electrnicamente a una farmacia diferente, informe a nuestra oficina a travs de MyChart de [place]   o por telfono llamando al [phone] y presione la opcin 4.  

## 2022-03-02 NOTE — Progress Notes (Signed)
   Follow-Up Visit   Subjective  Maria [name] is a 31 y.o. female who presents for the following: Eczema (With Pruritis, arms, legs, scalp, R hand, itching on R hand 3m f/u, Dupixent sq injections q 2 wks), folliculitis (Scalp with pruritis, Ketoconazole ~ 2x/wk, using H&S and Selson Blue other times), and Bumps that itch (Face, 12m, itchy). Pt has a special needs son requiring a lot of attention which is stressful.  The following portions of the chart were reviewed this encounter and updated as appropriate:   Tobacco  Allergies  Meds  Problems  Med Hx  Surg Hx  Fam Hx     Review of Systems:  No other skin or systemic complaints except as noted in HPI or Assessment and Plan.  Objective  Well appearing patient in no apparent distress; mood and affect are within normal limits.  A focused examination was performed including arms, legs, scalp, face. Relevant physical exam findings are noted in the Assessment and Plan.  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 This has improved significantly with initiation of Dupixent. Arms legs scalp  Atopic dermatitis - Severe, on Dupixent (biologic medication).  Atopic dermatitis (eczema) is a chronic, relapsing, pruritic condition that can significantly affect quality of life. It is often associated with allergic rhinitis and/or asthma and can require treatment with topical medications, phototherapy, or in severe cases a biologic medication called Dupixent, which requires long term medication management.    Chronic and persistent condition with duration or expected duration over one year. Condition is symptomatic / bothersome to patient. Not to goal.   Cont Dupixent 300mg /65ml sq injections q 2 wks Dupixent 300mg /72ml sq injection today to R upper arm Lot 04/2024 Cont Cetirizine 10mg  1  po qd Cont Singulair 10mg  1 po qd  dupilumab (DUPIXENT) prefilled syringe 300 mg - Left Antecubital Fossa  Related Medications dupilumab (DUPIXENT) 300 MG/2ML prefilled syringe Inject 300 mg into the skin every 14 (fourteen) days. Starting at day 15 for maintenance.  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 Condition is symptomatic / bothersome to patient. Not to goal.   Cont Ketoconazole 2% shampoo increasing to 3-7x/wk, let sit 5 minutes and rinse out, may use on face, avoid eyes Pt declined restarting Doxycyline due to hx of yeast infections with taking Doxycycline  Pt declines prescription topical acne treatment,  will cont otc acne treatment  Related Medications ketoconazole (NIZORAL) 2 % shampoo Apply 1 Application topically as directed. Shampoo scalp and face, avoid eye,  at least 3 times weekly, let sit several minutes and rinse out  Pruritus and other somatic complaints Possibly exacerbated by stress associated with caring for her special needs son.  Return for 2 wks with nurse, 1m with Dr. 05/2024 , Atopic Derm, Folliculitis/Acne/Rosacea.  I, , RMA, am acting as scribe for , MD . Documentation: I have reviewed the above documentation for accuracy and completeness, and I agree with the above.  1m, MD

## 2022-03-11 ENCOUNTER — Encounter: Payer: Self-pay | Admitting: Dermatology

## 2022-03-16 ENCOUNTER — Ambulatory Visit (INDEPENDENT_AMBULATORY_CARE_PROVIDER_SITE_OTHER): Payer: Medicaid Other | Admitting: Dermatology

## 2022-03-16 ENCOUNTER — Telehealth: Payer: Self-pay

## 2022-03-16 DIAGNOSIS — L209 Atopic dermatitis, unspecified: Secondary | ICD-10-CM

## 2022-03-16 MED ORDER — DUPILUMAB 300 MG/2ML ~~LOC~~ SOSY
300.0000 mg | PREFILLED_SYRINGE | Freq: Once | SUBCUTANEOUS | Status: AC
  Administered 2022-03-16: 300 mg via SUBCUTANEOUS

## 2022-03-16 NOTE — Telephone Encounter (Signed)
Patient here today for two week Dupixent injection. She states her scalp has started to flare again with bumps and becoming very itchy. Her last note you do have folliculitis documented and pt using Ketoconazole Shampoo 3-7 times weekly. Patient feels as this has flared worse ever since starting Dupixent injections. aw

## 2022-03-16 NOTE — Progress Notes (Signed)
Patient here today for Dupixent injection for severe atopic dermatitis.   Dupixent 300mg /69mL injected into left arm. Patient tolerated well.  LOT: 3m EXP: 04/2024  05/2024, RMA Documentation: I have reviewed the above documentation for accuracy and completeness, and I agree with the above.  Dorathy Daft, MD

## 2022-03-19 ENCOUNTER — Encounter: Payer: Self-pay | Admitting: Dermatology

## 2022-03-20 NOTE — Telephone Encounter (Signed)
Patient advised of information per Dr. Gwen Pounds to see different provider for second opinion. She is scheduled next Thursday for injection, switched to office visit with Dr. Neale Burly since she had opening at 3:40. aw

## 2022-03-30 ENCOUNTER — Ambulatory Visit: Payer: Medicaid Other | Admitting: Dermatology

## 2022-03-30 ENCOUNTER — Ambulatory Visit: Payer: Medicaid Other

## 2022-03-30 ENCOUNTER — Encounter: Payer: Self-pay | Admitting: Dermatology

## 2022-03-30 DIAGNOSIS — L2081 Atopic neurodermatitis: Secondary | ICD-10-CM | POA: Diagnosis not present

## 2022-03-30 DIAGNOSIS — L700 Acne vulgaris: Secondary | ICD-10-CM | POA: Diagnosis not present

## 2022-03-30 DIAGNOSIS — L739 Follicular disorder, unspecified: Secondary | ICD-10-CM

## 2022-03-30 DIAGNOSIS — Z79899 Other long term (current) drug therapy: Secondary | ICD-10-CM | POA: Diagnosis not present

## 2022-03-30 MED ORDER — KETOCONAZOLE 2 % EX SHAM: 1.0000 | MEDICATED_SHAMPOO | CUTANEOUS | 11 refills | Status: DC

## 2022-03-30 MED ORDER — TRALOKINUMAB-LDRM 150 MG/ML ~~LOC~~ SOSY
300.0000 mg | PREFILLED_SYRINGE | Freq: Once | SUBCUTANEOUS | Status: AC
  Administered 2022-03-30: 300 mg via SUBCUTANEOUS

## 2022-03-30 MED ORDER — CLINDAMYCIN PHOSPHATE 1 % EX SOLN: CUTANEOUS | 2 refills | Status: DC

## 2022-03-30 NOTE — Patient Instructions (Signed)
Recommend trial of Cetaphil cleanser or Dove for sensitive skin for soap. Follow with heavy creamy moisturizer such as Vanicream moisturizing cream, or cetaphil, cerave or Eucrin for Eczema relief.   Recommend Arm [employer] Hammer Oxiclean FREE laundry detergent (she does not want to switch brands)   Start clindamycin solution twice a day to affected areas at scalp  Continue alternating ketoconazole shampoo and head & shoulders shampoo, leaving in at least 5 minutes  Tralokinumab (Adbry) is a treatment given by injection for adults with moderate-to-severe atopic dermatitis. Goal is control of skin condition, not cure. It is given as 4 injections at the first dose followed by 2 injection ever 2 weeks thereafter.  Potential side effects include allergic reaction, injection site reactions and conjunctivitis (inflammation of the eyes).  The use of Adbry requires long term medication management, including periodic office visits.  Due to recent changes in healthcare laws, you may see results of your pathology and/or laboratory studies on MyChart before the doctors have had a chance to review them. We understand that in some cases there may be results that are confusing or concerning to you. Please understand that not all results are received at the same time and often the doctors may need to interpret multiple results in order to provide you with the best plan of care or course of treatment. Therefore, we ask that you please give [place] 2 business days to thoroughly review all your results before contacting the office for clarification. Should we see a critical lab result, you will be contacted sooner.   If You Need Anything After Your Visit  If you have any questions or concerns for your doctor, please call our main line at [phone] and press option 4 to reach your doctor's medical assistant. If no one answers, please leave a voicemail as directed and we will return your call as soon as possible. Messages left  after 4 pm will be answered the following business day.   You may also send [place] a message via MyChart. We typically respond to MyChart messages within 1-2 business days.  For prescription refills, please ask your pharmacy to contact our office. Our fax number is [phone].  If you have an urgent issue when the clinic is closed that cannot wait until the next business day, you can page your doctor at the number below.    Please note that while we do our best to be available for urgent issues outside of office hours, we are not available 24/7.   If you have an urgent issue and are unable to reach [place], you may choose to seek medical care at your doctor's office, retail clinic, urgent care center, or emergency room.  If you have a medical emergency, please immediately call 911 or go to the emergency department.  Pager Numbers  - Dr. Gwen Pounds: [phone]  - Dr. Neale Burly: [phone]  - Dr. Roseanne Reno: [phone]  In the event of inclement weather, please call our main line at [phone] for an update on the status of any delays or closures.  Dermatology Medication Tips: Please keep the boxes that topical medications come in in order to help keep track of the instructions about where and how to use these. Pharmacies typically print the medication instructions only on the boxes and not directly on the medication tubes.   If your medication is too expensive, please contact our office at [phone] option 4 or send [place] a message through MyChart.   We are unable to tell what your  co-pay for medications will be in advance as this is different depending on your insurance coverage. However, we may be able to find a substitute medication at lower cost or fill out paperwork to get insurance to cover a needed medication.   If a prior authorization is required to get your medication covered by your insurance company, please allow [place] 1-2 business days to complete this process.  Drug prices often  vary depending on where the prescription is filled and some pharmacies may offer cheaper prices.  The website www.goodrx.com contains coupons for medications through different pharmacies. The prices here do not account for what the cost may be with help from insurance (it may be cheaper with your insurance), but the website can give you the price if you did not use any insurance.  - You can print the associated coupon and take it with your prescription to the pharmacy.  - You may also stop by our office during regular business hours and pick up a GoodRx coupon card.  - If you need your prescription sent electronically to a different pharmacy, notify our office through [facility] or by phone at [phone] option 4.     Si Usted Necesita Algo Despus de Su Visita  Tambin puede enviarnos un mensaje a travs de Clinical cytogeneticist. Por lo general respondemos a los mensajes de MyChart en el transcurso de 1 a 2 das hbiles.  Para renovar recetas, por favor pida a su farmacia que se ponga en contacto con nuestra oficina. Annie Sable de fax es [place] [phone].  Si tiene un asunto urgente cuando la clnica est cerrada y que no puede esperar hasta el siguiente da hbil, puede llamar/localizar a su doctor(a) al nmero que aparece a continuacin.   Por favor, tenga en cuenta que aunque hacemos todo lo posible para estar disponibles para asuntos urgentes fuera del horario de [place], no estamos disponibles las 24 horas del da, los 7 [address]  [address] de la [place].   Si tiene un problema urgente y no puede comunicarse con nosotros, puede optar por buscar atencin mdica  en el consultorio de su doctor(a), en una clnica privada, en un centro de atencin urgente o en una sala de emergencias.  Si tiene Engineer, drilling, por favor llame inmediatamente al 911 o vaya a la sala de emergencias.  Nmeros de bper  - Dr. Gwen Pounds: [phone]  - Dra. Moye: [phone]  - Dra. Roseanne Reno: [phone]  En caso  de inclemencias del [place], por favor llame a Lacy Duverney principal al [phone] para una actualizacin sobre el [place] de cualquier retraso o cierre.  Consejos para la medicacin en dermatologa: Por favor, guarde las cajas en las que vienen los medicamentos de uso tpico para ayudarle a seguir las instrucciones sobre dnde y cmo usarlos. Las farmacias generalmente imprimen las instrucciones del medicamento slo en las cajas y no directamente en los tubos del [place].   Si su medicamento es muy caro, por favor, pngase en contacto con Rolm [place] llamando al [phone] y presione la opcin 4 o envenos un mensaje a travs de Clinical cytogeneticist.   No podemos decirle cul ser su copago por los medicamentos por adelantado ya que esto es diferente dependiendo de la cobertura de su seguro. Sin embargo, es posible que podamos encontrar un medicamento sustituto a Audiological scientist un formulario para que el seguro cubra el medicamento que se considera necesario.   Si se requiere una autorizacin previa para que su compaa de seguros [place] su medicamento, por  favor permtanos de 1 a 2 das hbiles para completar [address]  Los precios de los medicamentos varan con frecuencia dependiendo del Environmental consultant de dnde se surte la receta y alguna farmacias pueden ofrecer precios ms baratos.  El sitio web www.goodrx.com tiene cupones para medicamentos de Health and safety inspector. Los precios aqu no tienen en cuenta lo que podra costar con la ayuda del seguro (puede ser ms barato con su seguro), pero el sitio web puede darle el precio si no utiliz Tourist information centre manager.  - Puede imprimir el cupn correspondiente y llevarlo con su receta a la farmacia.  - Tambin puede pasar por nuestra oficina durante el horario de atencin regular y Education officer, museum una tarjeta de cupones de GoodRx.  - Si necesita que su receta se enve electrnicamente a una farmacia diferente, informe a nuestra oficina a travs de MyChart de [place] o  por telfono llamando al [phone] y presione la opcin 4.

## 2022-03-30 NOTE — Progress Notes (Signed)
Follow-Up Visit   Subjective  Maria [name] is a 31 y.o. female who presents for the following: Rash (Recheck scalp. H/O folliculitis and atopic dermatitis. Has been using Ketoconazole shampoo as directed. Has been on Dupixent, helped at first with itching. Dupixent is no longer helping with itching. Is also having nausea. Is on Cetirizine and Singulair. C/O randomly itching, feeling like something is stinging or sticking her under the skin).  The following portions of the chart were reviewed this encounter and updated as appropriate:  Tobacco  Allergies  Meds  Problems  Med Hx  Surg Hx  Fam Hx      Review of Systems: No other skin or systemic complaints except as noted in HPI or Assessment and Plan.   Objective  Well appearing patient in no apparent distress; mood and affect are within normal limits.  A focused examination was performed including scalp, face, arms, legs. Relevant physical exam findings are noted in the Assessment and Plan.  arms, legs, scalp   Scalp Scattered erythematous papules  Scalp Scattered erythematous papules   Assessment & Plan  Atopic neurodermatitis arms, legs, scalp  Chronic and persistent condition with duration or expected duration over one year. Condition is symptomatic/ bothersome to patient. Not currently at goal.  With severe pruritus of the scalp waking her up at night. This has improved significantly with initiation of Dupixent.   Try switch to Adbry.  Adbry 150mg /ml syringe x2 injected into right upper arm. Patient tolerated well. Lot: Exp: 09/2022 NDC: 10/2022  May also consider low dose naltrexone in future.    Atopic dermatitis - Severe, on Dupixent (biologic medication).  Atopic dermatitis (eczema) is a chronic, relapsing, pruritic condition that can significantly affect quality of life. It is often associated with allergic rhinitis and/or asthma and can require treatment with topical medications,  phototherapy, or in severe cases a biologic medication called Dupixent, which requires long term medication management.     Stop Dupixent.  Tralokinumab [id number]) is a treatment given by injection for adults with moderate-to-severe atopic dermatitis. Goal is control of skin condition, not cure. It is given as 4 injections at the first dose followed by 2 injection ever 2 weeks thereafter.  Potential side effects include allergic reaction, injection site reactions and conjunctivitis (inflammation of the eyes).  The use of Adbry requires long term medication management, including periodic office visits.   Related Medications Tralokinumab-ldrm SOSY 300 mg   Folliculitis Scalp  Chronic and persistent condition with duration or expected duration over one year. Condition is symptomatic/ bothersome to patient. Not currently at goal.  Continue Ketoconazole shampoo apply three times per week, massage into scalp and leave in for 10 minutes before rinsing out   Related Medications ketoconazole (NIZORAL) 2 % shampoo Apply 1 Application topically as directed. Shampoo scalp and face, avoid eye,  at least 3 times weekly, let sit several minutes and rinse out  Acne vulgaris Scalp  Rosacea, folliculitis overlap, recalcitrant   Chronic and persistent condition with duration or expected duration over one year. Condition is symptomatic/ bothersome to patient. Not currently at goal.  Start clindamycin solution twice a day to bumps at scalp.  Continue ketoconazole shampoo alternating with head and shoulders shampoo  Cannot tolerate PO doxycycline or minocycline, causes yeast infections. Had some improvement  Consider low dose doxycycline 20 mg BID or Oracea if not improved with clindamycin. Could also consider amzeeq foam if she fails clindamycin.    clindamycin (CLEOCIN T) 1 % external solution -  Scalp Apply twice daily to scalp   Return for Injection On Nurse Schedule 2 weeks, Atopic Dermatitis  Recheck 6 weeks.  I, Lawson Radar, CMA, am acting as scribe for Darden Dates, MD.  Documentation: I have reviewed the above documentation for accuracy and completeness, and I agree with the above.  Darden Dates, MD

## 2022-04-11 ENCOUNTER — Other Ambulatory Visit: Payer: Self-pay

## 2022-04-13 ENCOUNTER — Ambulatory Visit (INDEPENDENT_AMBULATORY_CARE_PROVIDER_SITE_OTHER): Payer: Medicaid Other | Admitting: Dermatology

## 2022-04-13 DIAGNOSIS — L209 Atopic dermatitis, unspecified: Secondary | ICD-10-CM

## 2022-04-13 MED ORDER — TRALOKINUMAB-LDRM 150 MG/ML ~~LOC~~ SOSY
300.0000 mg | PREFILLED_SYRINGE | Freq: Once | SUBCUTANEOUS | Status: AC
  Administered 2022-04-13: 300 mg via SUBCUTANEOUS

## 2022-04-13 NOTE — Progress Notes (Signed)
Patient here for 2 week Adbry injection for severe atopic dermatitis.   Patient was injected with Adbry 150mg /ml into the Left Upper arm x2. Patient tolerated procedure well.   Patient also states that she has noticed significant improvement in her itching since switching to Adbry.   Exp:09/2022 NDC:50222-346-02  10/2022 RMA   Documentation: I have reviewed the above documentation for accuracy and completeness, and I agree with the above.  Evorn Gong, MD

## 2022-04-14 ENCOUNTER — Encounter: Payer: Self-pay | Admitting: Dermatology

## 2022-04-17 ENCOUNTER — Encounter: Payer: Self-pay | Admitting: Dermatology

## 2022-04-20 ENCOUNTER — Encounter: Payer: Self-pay | Admitting: Gastroenterology

## 2022-04-20 ENCOUNTER — Ambulatory Visit (INDEPENDENT_AMBULATORY_CARE_PROVIDER_SITE_OTHER): Payer: Medicaid Other | Admitting: Gastroenterology

## 2022-04-20 VITALS — BP 119/79 | HR 83 | Temp 98.3°F | Ht 62.0 in | Wt 158.2 lb

## 2022-04-20 DIAGNOSIS — R1031 Right lower quadrant pain: Secondary | ICD-10-CM | POA: Diagnosis not present

## 2022-04-20 DIAGNOSIS — R110 Nausea: Secondary | ICD-10-CM

## 2022-04-20 MED ORDER — ONDANSETRON HCL 4 MG PO TABS: 4.0000 mg | ORAL_TABLET | Freq: Three times a day (TID) | ORAL | 0 refills | Status: DC | PRN

## 2022-04-20 NOTE — Progress Notes (Signed)
Arlyss Repress, MD [address]  [address]  [place], [place] [address]  Main: [phone]  Fax: [phone]    Gastroenterology Consultation  Referring Provider:     Retia Passe, NP Primary Care Physician:  Retia Passe, NP Primary Gastroenterologist:  Dr. Arlyss Repress Reason for Consultation:     Chronic constipation, right lower quadrant pain, nausea       HPI:   Maria [name] is a 31 y.o. female referred by Dr. Blenda Nicely  for consultation & management of chronic constipation.  Patient reports that for more than a year, she has been experiencing irregular bowel habits, describes on Bristol stool scale as 1 or 2.  She was started on Linzess 72 MCG by her PCP who is now retired.  She reported experiencing diarrhea when she was taking Linzess daily with 3-4 bowel movements after she takes the pill.  Therefore, she is not taking it as needed only.  She does report abdominal bloating.  She is also experiencing perianal itching, pressure/swelling and reports that she notices blood on wiping once in a blue moon.  Her weight is stable.  She does acknowledge that she does not get enough fiber in her diet.  She manages to drink adequate water daily.  She denies abdominal pain, weight loss.  She is also noticed to have elevated transaminases, ALT 118, AST 43 in 03/2020.  Patient reports that she was taking excessive amounts of Tylenol for headache.  She is currently seeing a headache specialist and her headaches are better managed now.  She denies alcohol intake  Follow-up visit 01/31/2021 Maria [name] is doing well with regards to her constipation.  She thinks Linzess is helping her with her symptoms.  She has also cut back on carbonated beverages, cheese.  She is no longer experiencing rectal pain.  She did not fill a prescription for nitroglycerin ointment as it was expensive.   Follow-up visit 08/02/2021 Patient is doing well from GI standpoint.  She has a lot going on in her  personal life, stress at work, stress with her kids who has health issues, she is a single mom.  She is taking Linzess 72 MCG daily.  Her LFTs 6 months ago were normal.  Follow-up visit 11/02/2021 Patient made an urgent visit to see me to discuss about rectal bleeding.  Patient reports that about a month ago, she had an episode of bright red blood per rectum.  She was also experiencing left-sided abdominal pain associated with abdominal bloating and severe constipation.  Patient was evaluated by her OB/GYN about a week ago, underwent abdominal ultrasound which was unremarkable.  She was told that her symptoms are secondary to constipation, therefore made an appointment with me.  Apparently, patient is has not been taking Linzess daily.  She also states that whenever she takes Linzess, she moves her bowels well and feels well.  She is no longer experiencing rectal bleeding.  She was told about colorectal cancer by her neighbor and she got worried, made an appointment to discuss about colonoscopy.  Her abdominal pain has improved but not resolved.  She does have abdominal bloating.  Patient has gained about 10 pounds since last visit  Follow-up visit 04/20/2022 Patient is here to discuss about approximately 1 month history of right lower abdominal pain associated with episodes of severe nausea without any vomiting.  She has history of constipation for which she takes Linzess 72 mcg daily or every other day.  She  does report sensation of incomplete emptying but she manages to go daily.  She consumes red meat on a daily basis.  She denies any other GI symptoms.  She does not smoke or drink alcohol.  Patient reports that Zofran helps tremendously with her nausea  NSAIDs: None  Antiplts/Anticoagulants/Anti thrombotics: None  GI Procedures:  Flexible sigmoidoscopy 11/30/2021 - The entire examined colon is normal. - No specimens collected.  She denies family history of GI malignancy  Past Medical History:   Diagnosis Date   Acute pancreatitis    Asthma    GERD (gastroesophageal reflux disease)    Hemorrhoid    Hx of varicella    Ovarian cyst    Recurrent boils    legs and buttocks.  staff infection, was neg   Thrombocytosis 06/09/2016   UTI (urinary tract infection)     Past Surgical History:  Procedure Laterality Date   CESAREAN SECTION N/A 05/25/2016   Procedure: CESAREAN SECTION;  Surgeon: Gerald Leitz, MD;  Location: [facility];  Service: Obstetrics;  Laterality: N/A;   FLEXIBLE SIGMOIDOSCOPY N/A 11/30/2021   Procedure: FLEXIBLE SIGMOIDOSCOPY;  Surgeon: Toney Reil, MD;  Location: [facility] ENDOSCOPY;  Service: Gastroenterology;  Laterality: N/A;   TONSILLECTOMY     WISDOM TOOTH EXTRACTION      Current Outpatient Medications:    albuterol (VENTOLIN HFA) 108 (90 Base) MCG/ACT inhaler, Inhale 2 puffs into the lungs every 6 (six) hours as needed for wheezing or shortness of breath., Disp: 8 g, Rfl: 2   cetirizine (ZYRTEC) 10 MG tablet, Take 1 tablet by mouth daily., Disp: , Rfl:    clindamycin (CLEOCIN T) 1 % external solution, Apply twice daily to scalp, Disp: 60 mL, Rfl: 2   clonazePAM (KLONOPIN) 0.5 MG tablet, , Disp: , Rfl:    cyclobenzaprine (FLEXERIL) 10 MG tablet, Take 1 tablet by mouth every 8 (eight) hours., Disp: , Rfl:    D3-50 1.25 MG (50000 UT) capsule, Take 50,000 Units by mouth once a week., Disp: , Rfl:    DENTAGEL 1.1 % GEL dental gel, Place onto teeth daily., Disp: , Rfl:    ketoconazole (NIZORAL) 2 % shampoo, Apply 1 Application topically as directed. Shampoo scalp and face, avoid eye,  at least 3 times weekly, let sit several minutes and rinse out, Disp: 120 mL, Rfl: 11   linaclotide (LINZESS) 72 MCG capsule, Take 72 mcg by mouth daily before breakfast., Disp: , Rfl:    medroxyPROGESTERone Acetate 150 MG/ML SUSY, Inject 1 mL into the muscle every 3 (three) months., Disp: , Rfl:    montelukast (SINGULAIR) 10 MG tablet, Take 1 tablet (10 mg total) by mouth at  bedtime., Disp: 30 tablet, Rfl: 6   oxyCODONE-acetaminophen (PERCOCET/ROXICET) 5-325 MG tablet, Take by mouth every 4 (four) hours as needed for severe pain., Disp: , Rfl:    pantoprazole (PROTONIX) 40 MG tablet, Take 40 mg by mouth daily., Disp: , Rfl:    Tralokinumab-ldrm (ADBRY) 150 MG/ML SOSY, Inject into the skin., Disp: , Rfl:    Family History  Problem Relation Age of Onset   Hypertension Maternal Grandmother    Heart disease Maternal Grandfather    Stroke Maternal Grandfather    Diabetes Neg Hx    Cancer Neg Hx      Social History   Tobacco Use   Smoking status: Former    Packs/day: 0.50    Types: Cigarettes    Quit date: 11/07/2013    Years since quitting: 8.4  Smokeless tobacco: Former  Building services engineer Use: Never used  Substance Use Topics   Alcohol use: Yes    Alcohol/week: 1.0 standard drink of alcohol    Types: 1 Cans of beer per week    Comment: occasional not while preg   Drug use: No    Allergies as of 04/20/2022 - Review Complete 04/20/2022  Allergen Reaction Noted   Amoxicillin Rash 11/16/2020   Penicillins Hives and Other (See Comments) 09/08/2012    Review of Systems:    All systems reviewed and negative except where noted in HPI.   Physical Exam:  BP 119/79 (BP Location: Left Arm, Patient Position: Sitting, Cuff Size: Normal)   Pulse 83   Temp 98.3 F (36.8 C) (Oral)   Ht 5\' 2"  (1.575 m)   Wt 158 lb 4 oz (71.8 kg)   BMI 28.94 kg/m  No LMP recorded. Patient has had an injection.  General:   Alert,  Well-developed, well-nourished, pleasant and cooperative in NAD Head:  Normocephalic and atraumatic. Eyes:  Sclera clear, no icterus.   Conjunctiva pink. Ears:  Normal auditory acuity. Nose:  No deformity, discharge, or lesions. Mouth:  No deformity or lesions,oropharynx pink & moist. Neck:  Supple; no masses or thyromegaly. Lungs:  Respirations even and unlabored.  Clear throughout to auscultation.   No wheezes, crackles, or rhonchi. No  acute distress. Heart:  Regular rate and rhythm; no murmurs, clicks, rubs, or gallops. Abdomen:  Normal bowel sounds. Soft, nondistended, mild right lower quadrant tenderness without masses, hepatosplenomegaly or hernias noted.  No guarding or rebound tenderness.   Rectal: Not performed Msk:  Symmetrical without gross deformities. Good, equal movement & strength bilaterally. Pulses:  Normal pulses noted. Extremities:  No clubbing or edema.  No cyanosis. Neurologic:  Alert and oriented x3;  grossly normal neurologically. Skin:  Intact without significant lesions or rashes. No jaundice. Psych:  Alert and cooperative. Normal mood and affect.  Imaging Studies: Reviewed  Assessment and Plan:   Maria [name] is a 31 y.o. pleasant Caucasian female with remote history of acute pancreatitis thought to be secondary to alcohol use, chronic constipation now presents with 1 month history of right lower quadrant pain and intermittent nausea.  Patient denies any fever, chills or vomiting.  Likely, her symptoms are secondary to constipation not being well controlled Advised patient to increase Linzess 72 mcg to 2 pills daily and see if it helps Continue Zofran 4 mg as needed for nausea Strongly advised patient to cut back on consumption of red meat   Follow up as needed, contact via MyChart   34, MD

## 2022-04-26 ENCOUNTER — Ambulatory Visit (INDEPENDENT_AMBULATORY_CARE_PROVIDER_SITE_OTHER): Payer: Medicaid Other | Admitting: Dermatology

## 2022-04-26 DIAGNOSIS — L209 Atopic dermatitis, unspecified: Secondary | ICD-10-CM | POA: Diagnosis not present

## 2022-04-26 MED ORDER — TRALOKINUMAB-LDRM 150 MG/ML ~~LOC~~ SOSY
300.0000 mg | PREFILLED_SYRINGE | Freq: Once | SUBCUTANEOUS | Status: AC
  Administered 2022-04-26: 300 mg via SUBCUTANEOUS

## 2022-04-26 NOTE — Progress Notes (Signed)
Patient here for 2 week Adbry injection for severe atopic dermatitis.    Patient was injected with Adbry 150mg /ml into the Right Upper arm x2. Patient tolerated procedure well.    Patient also states that she has noticed significant improvement in her itching since switching to Adbry.    Exp:09/2022 NDC:50222-346-02   10/2022 RMA  Documentation: I have reviewed the above documentation for accuracy and completeness, and I agree with the above.  Evorn Gong, MD

## 2022-05-01 ENCOUNTER — Ambulatory Visit: Payer: Medicaid Other | Admitting: Dermatology

## 2022-05-07 ENCOUNTER — Encounter: Payer: Self-pay | Admitting: Dermatology

## 2022-05-11 ENCOUNTER — Ambulatory Visit (INDEPENDENT_AMBULATORY_CARE_PROVIDER_SITE_OTHER): Payer: Medicaid Other | Admitting: Dermatology

## 2022-05-11 ENCOUNTER — Encounter: Payer: Self-pay | Admitting: Dermatology

## 2022-05-11 DIAGNOSIS — L2081 Atopic neurodermatitis: Secondary | ICD-10-CM | POA: Diagnosis not present

## 2022-05-11 DIAGNOSIS — L700 Acne vulgaris: Secondary | ICD-10-CM

## 2022-05-11 DIAGNOSIS — L739 Follicular disorder, unspecified: Secondary | ICD-10-CM

## 2022-05-11 DIAGNOSIS — L719 Rosacea, unspecified: Secondary | ICD-10-CM

## 2022-05-11 DIAGNOSIS — Z79899 Other long term (current) drug therapy: Secondary | ICD-10-CM

## 2022-05-11 MED ORDER — TRALOKINUMAB-LDRM 150 MG/ML ~~LOC~~ SOSY
300.0000 mg | PREFILLED_SYRINGE | Freq: Once | SUBCUTANEOUS | Status: AC
  Administered 2022-05-11: 300 mg via SUBCUTANEOUS

## 2022-05-11 MED ORDER — DOXYCYCLINE HYCLATE 20 MG PO TABS: 20.0000 mg | ORAL_TABLET | Freq: Two times a day (BID) | ORAL | 2 refills | Status: DC

## 2022-05-11 NOTE — Patient Instructions (Addendum)
Continue Ketoconazole shampoo apply three times per week, massage into scalp and leave in for 10 minutes before rinsing out   Start Oracea 40mg  one capsule once daily with food. Take samples to make sure tolerated before picking up Rx.  Could also consider amzeeq foam   If not causing yeast infection fill Rx for Doxycycline 20mg  to take 1 capsule twice daily.  Doxycycline should be taken with food to prevent nausea. Do not lay down for 30 minutes after taking. Be cautious with sun exposure and use good sun protection while on this medication. Pregnant women should not take this medication.     Gentle Skin Care Guide  1. Bathe no more than once a day.  2. Avoid bathing in hot water  3. Use a mild soap like Dove, Vanicream, Cetaphil, CeraVe. Can use Lever 2000 or Cetaphil antibacterial soap  4. Use soap only where you need it. On most days, use it under your arms, between your legs, and on your feet. Let the water rinse other areas unless visibly dirty.  5. When you get out of the bath/shower, use a towel to gently blot your skin dry, don't rub it.  6. While your skin is still a little damp, apply a moisturizing cream such as Vanicream, CeraVe, Cetaphil, Eucerin, Sarna lotion or plain Vaseline Jelly. For hands apply Neutrogena [place] Hand Cream or Excipial Hand Cream.  7. Reapply moisturizer any time you start to itch or feel dry.  8. Sometimes using free and clear laundry detergents can be helpful. Fabric softener sheets should be avoided. Downy Free & Gentle liquid, or any liquid fabric softener that is free of dyes and perfumes, it acceptable to use  9. If your doctor has given you prescription creams you may apply moisturizers over them      Due to recent changes in healthcare laws, you may see results of your pathology and/or laboratory studies on MyChart before the doctors have had a chance to review them. We understand that in some cases there may be results that are  confusing or concerning to you. Please understand that not all results are received at the same time and often the doctors may need to interpret multiple results in order to provide you with the best plan of care or course of treatment. Therefore, we ask that you please give [place] 2 business days to thoroughly review all your results before contacting the office for clarification. Should we see a critical lab result, you will be contacted sooner.   If You Need Anything After Your Visit  If you have any questions or concerns for your doctor, please call our main line at [phone] and press option 4 to reach your doctor's medical assistant. If no one answers, please leave a voicemail as directed and we will return your call as soon as possible. Messages left after 4 pm will be answered the following business day.   You may also send [place] a message via [place]. We typically respond to MyChart messages within 1-2 business days.  For prescription refills, please ask your pharmacy to contact our office. Our fax number is [phone].  If you have an urgent issue when the clinic is closed that cannot wait until the next business day, you can page your doctor at the number below.    Please note that while we do our best to be available for urgent issues outside of office hours, we are not available 24/7.   If you have an urgent  issue and are unable to reach [place], you may choose to seek medical care at your doctor's office, retail clinic, urgent care center, or emergency room.  If you have a medical emergency, please immediately call 911 or go to the emergency department.  Pager Numbers  - Dr. Gwen Pounds: [phone]  - Dr. Neale Burly: [phone]  - Dr. Roseanne Reno: [phone]  In the event of inclement weather, please call our main line at [phone] for an update on the status of any delays or closures.  Dermatology Medication Tips: Please keep the boxes that topical medications come in in order to  help keep track of the instructions about where and how to use these. Pharmacies typically print the medication instructions only on the boxes and not directly on the medication tubes.   If your medication is too expensive, please contact our office at [phone] option 4 or send [place] a message through MyChart.   We are unable to tell what your co-pay for medications will be in advance as this is different depending on your insurance coverage. However, we may be able to find a substitute medication at lower cost or fill out paperwork to get insurance to cover a needed medication.   If a prior authorization is required to get your medication covered by your insurance company, please allow [place] 1-2 business days to complete this process.  Drug prices often vary depending on where the prescription is filled and some pharmacies may offer cheaper prices.  The website www.goodrx.com contains coupons for medications through different pharmacies. The prices here do not account for what the cost may be with help from insurance (it may be cheaper with your insurance), but the website can give you the price if you did not use any insurance.  - You can print the associated coupon and take it with your prescription to the pharmacy.  - You may also stop by our office during regular business hours and pick up a GoodRx coupon card.  - If you need your prescription sent electronically to a different pharmacy, notify our office through [facility] or by phone at [phone] option 4.     Si Usted Necesita Algo Despus de Su Visita  Tambin puede enviarnos un mensaje a travs de Clinical cytogeneticist. Por lo general respondemos a los mensajes de MyChart en el transcurso de 1 a 2 das hbiles.  Para renovar recetas, por favor pida a su farmacia que se ponga en contacto con nuestra oficina. Annie Sable de fax es [place] [phone].  Si tiene un asunto urgente cuando la clnica est cerrada y que no puede esperar hasta  el siguiente da hbil, puede llamar/localizar a su doctor(a) al nmero que aparece a continuacin.   Por favor, tenga en cuenta que aunque hacemos todo lo posible para estar disponibles para asuntos urgentes fuera del horario de [place], no estamos disponibles las 24 horas del da, los 7 [address]  [address] de la [place].   Si tiene un problema urgente y no puede comunicarse con nosotros, puede optar por buscar atencin mdica  en el consultorio de su doctor(a), en una clnica privada, en un centro de atencin urgente o en una sala de emergencias.  Si tiene Engineer, drilling, por favor llame inmediatamente al 911 o vaya a la sala de emergencias.  Nmeros de bper  - Dr. Gwen Pounds: [phone]  - Dra. Moye: [phone]  - Dra. Roseanne Reno: [phone]  En caso de inclemencias del [place], por favor llame a nuestra lnea principal al [phone] para Neomia Dear actualizacin  sobre el estado de cualquier retraso o cierre.  Consejos para la medicacin en dermatologa: Por favor, guarde las cajas en las que vienen los medicamentos de uso tpico para ayudarle a seguir las instrucciones sobre dnde y cmo usarlos. Las farmacias generalmente imprimen las instrucciones del medicamento slo en las cajas y no directamente en los tubos del [place].   Si su medicamento es muy caro, por favor, pngase en contacto con Zigmund Daniel llamando al [phone] y presione la opcin 4 o envenos un mensaje a travs de Pharmacist, community.   No podemos decirle cul ser su copago por los medicamentos por adelantado ya que esto es diferente dependiendo de la cobertura de su seguro. Sin embargo, es posible que podamos encontrar un medicamento sustituto a Electrical engineer un formulario para que el seguro cubra el medicamento que se considera necesario.   Si se requiere una autorizacin previa para que su compaa de seguros [place] su medicamento, por favor permtanos de 1 a 2 das hbiles para completar este proceso.  Los precios de los  medicamentos varan con frecuencia dependiendo del Environmental consultant de dnde se surte la receta y alguna farmacias pueden ofrecer precios ms baratos.  El sitio web www.goodrx.com tiene cupones para medicamentos de Airline pilot. Los precios aqu no tienen en cuenta lo que podra costar con la ayuda del seguro (puede ser ms barato con su seguro), pero el sitio web puede darle el precio si no utiliz Research scientist (physical sciences).  - Puede imprimir el cupn correspondiente y llevarlo con su receta a la farmacia.  - Tambin puede pasar por nuestra oficina durante el horario de atencin regular y Charity fundraiser una tarjeta de cupones de GoodRx.  - Si necesita que su receta se enve electrnicamente a una farmacia diferente, informe a nuestra oficina a travs de MyChart de [place] o por telfono llamando al [phone] y presione la opcin 4.

## 2022-05-11 NOTE — Progress Notes (Signed)
Follow-Up Visit   Subjective  Maria [name] is a 31 y.o. female who presents for the following: Rash (Recheck scalp. H/O folliculitis and atopic dermatitis. Has been using Ketoconazole shampoo as directed. Has been on Dupixent, helped at first with itching then no longer helping with itching. Started Adbry 03/30/2022. Helped a lot with itching at first. Patient states the itching is out of control now. Has to wash hair every day or scalp itches badly. Has been experiencing nausea).   The following portions of the chart were reviewed this encounter and updated as appropriate:  Tobacco  Allergies  Meds  Problems  Med Hx  Surg Hx  Fam Hx      Review of Systems: No other skin or systemic complaints except as noted in HPI or Assessment and Plan.   Objective  Well appearing patient in no apparent distress; mood and affect are within normal limits.  A focused examination was performed including head, including the scalp, face, neck, nose, ears, eyelids, and lips and arms. Relevant physical exam findings are noted in the Assessment and Plan.  Scalp, arms, legs Erythema with mild scale  Scalp Erythematous papules  Scalp Erythematous papules   Assessment & Plan  Atopic neurodermatitis Scalp, arms, legs  Chronic and persistent condition with duration or expected duration over one year. Condition is symptomatic/ bothersome to patient. Not currently at goal.   With severe pruritus of the scalp waking her up at night. This has improved with Adbry.  Continue Adbry every 2 weeks.   Adbry 150mg /46ml x2 injected into left upper arm. NDC: 0m Lot: [id number] Exp: 04/2023 Patient tolerated well.  Atopic dermatitis - Severe, on Dupixent (biologic medication).  Atopic dermatitis (eczema) is a chronic, relapsing, pruritic condition that can significantly affect quality of life. It is often associated with allergic rhinitis and/or asthma and can require treatment with topical  medications, phototherapy, or in severe cases a biologic medication called Dupixent, which requires long term medication management.    Related Medications Tralokinumab-ldrm SOSY 300 mg   Folliculitis Scalp  Chronic and persistent condition with duration or expected duration over one year. Condition is symptomatic/ bothersome to patient. Not currently at goal.   Continue Ketoconazole shampoo apply three times per week, massage into scalp and leave in for 10 minutes before rinsing out.  Can continue alternating with [place] Blue shampoo  Continue clindamycin topical  Add oracea daily  Related Medications ketoconazole (NIZORAL) 2 % shampoo Apply 1 Application topically as directed. Shampoo scalp and face, avoid eye,  at least 3 times weekly, let sit several minutes and rinse out  Acne vulgaris Scalp  Rosacea, folliculitis overlap, recalcitrant    Chronic and persistent condition with duration or expected duration over one year. Condition is symptomatic/ bothersome to patient. Not currently at goal.   Continue clindamycin solution twice a day to bumps at scalp.  Continue ketoconazole shampoo alternating with head and shoulders shampoo   Cannot tolerate PO doxycycline or minocycline, causes yeast infections. Had some improvement   Start Oracea 40mg  one capsule once daily with food. Take samples to make sure tolerated before picking up Rx.  Could also consider amzeeq foam   If not causing yeast infection fill Rx for Doxycycline 20mg  to take 1 capsule twice daily.  Doxycycline should be taken with food to prevent nausea. Do not lay down for 30 minutes after taking. Be cautious with sun exposure and use good sun protection while on this medication. Pregnant women should not take this  medication.   Briefly discussed Isotretinoin therapy.     doxycycline (PERIOSTAT) 20 MG tablet - Scalp Take 1 tablet (20 mg total) by mouth 2 (two) times daily. Take with food  Related  Medications clindamycin (CLEOCIN T) 1 % external solution Apply twice daily to scalp   Return for Rash Follow Up 4-8 weeks, Adbry Injection On Nurse Schedule in 2 weeks.  I, Emelia Salisbury, CMA, am acting as scribe for Forest Gleason, MD.  Documentation: I have reviewed the above documentation for accuracy and completeness, and I agree with the above.  Forest Gleason, MD

## 2022-05-16 ENCOUNTER — Encounter: Payer: Self-pay | Admitting: Dermatology

## 2022-05-30 ENCOUNTER — Telehealth: Payer: Self-pay

## 2022-05-30 DIAGNOSIS — R195 Other fecal abnormalities: Secondary | ICD-10-CM

## 2022-05-30 NOTE — Telephone Encounter (Signed)
Patient also states her stomach makes a lot of noises after she eats

## 2022-05-30 NOTE — Telephone Encounter (Signed)
Patient verbalized understanding of instructions  

## 2022-05-30 NOTE — Telephone Encounter (Signed)
Patient is calling because she has been having dark green stool that is loose for 2-3 days. She denies any blood in stool or abdominal pain. Patient wants to know if this is something that needs to be checked out or what you recommend

## 2022-06-03 LAB — GI PROFILE, STOOL, PCR

## 2022-06-05 ENCOUNTER — Telehealth: Payer: Self-pay

## 2022-06-05 NOTE — Telephone Encounter (Signed)
Patient verbalized understanding of results  

## 2022-06-05 NOTE — Telephone Encounter (Signed)
-----   Message from Lin Landsman, MD sent at 06/05/2022  3:47 PM EDT ----- Stool studies for infection came back negative  RV

## 2022-06-07 ENCOUNTER — Ambulatory Visit: Payer: Medicaid Other | Admitting: Dermatology

## 2022-06-07 DIAGNOSIS — L700 Acne vulgaris: Secondary | ICD-10-CM

## 2022-06-07 DIAGNOSIS — L739 Follicular disorder, unspecified: Secondary | ICD-10-CM | POA: Diagnosis not present

## 2022-06-07 DIAGNOSIS — L650 Telogen effluvium: Secondary | ICD-10-CM | POA: Diagnosis not present

## 2022-06-07 DIAGNOSIS — L2081 Atopic neurodermatitis: Secondary | ICD-10-CM | POA: Diagnosis not present

## 2022-06-07 DIAGNOSIS — L299 Pruritus, unspecified: Secondary | ICD-10-CM

## 2022-06-07 IMAGING — US US ABDOMEN COMPLETE
1 series · 14 of 25 positions shown · non-contrast
Comparison: CT 04/07/2020

CLINICAL DATA: Abdomen pain

EXAM:
ABDOMEN ULTRASOUND COMPLETE

[Series 1: us abdomen complete · 0.22mm/px · 14 of 119 slices shown]
[im 1/119]
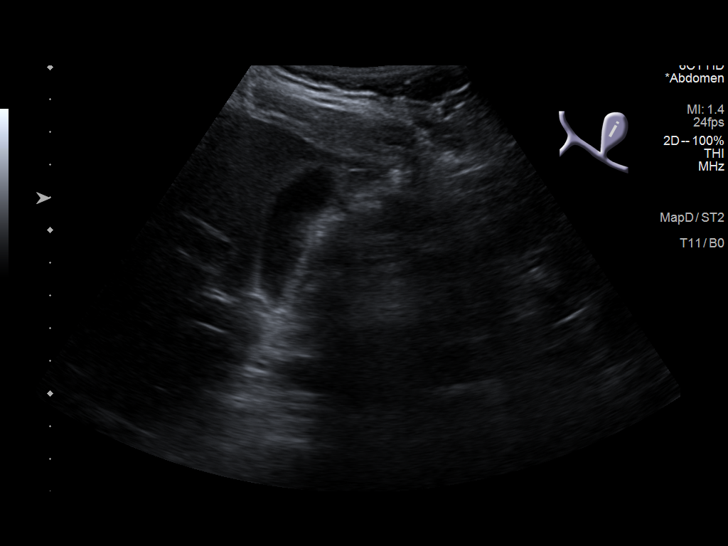
[im 10/119]
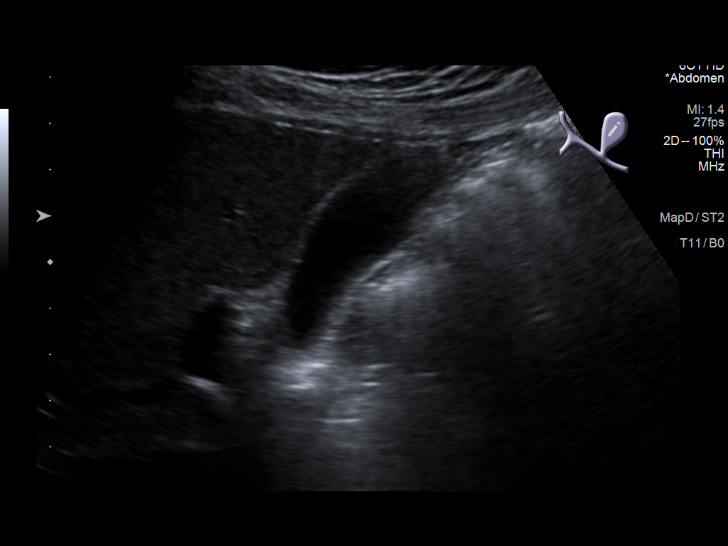
[im 20/119]
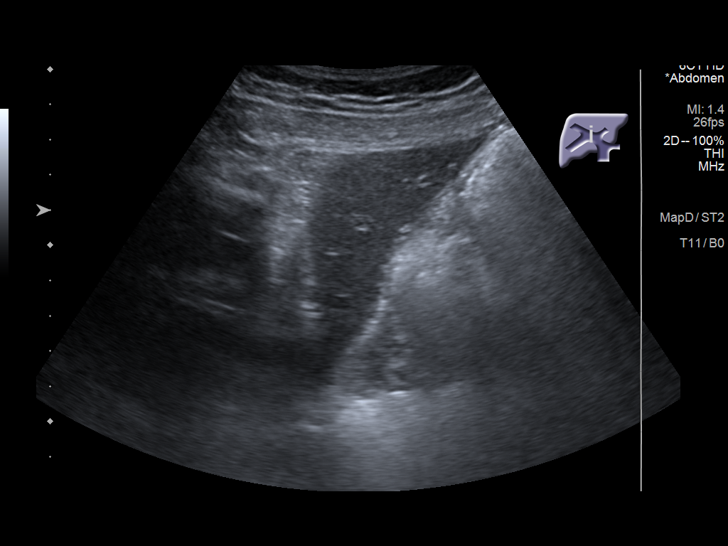
[im 30/119]
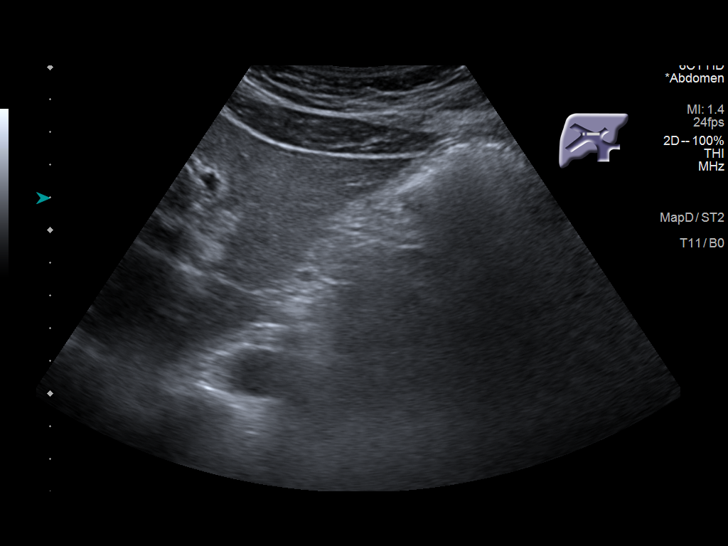
[im 40/119]
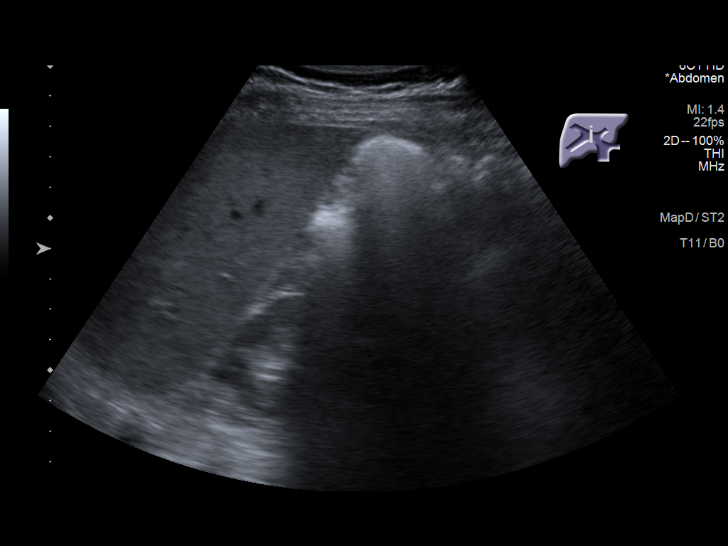
[im 45/119]
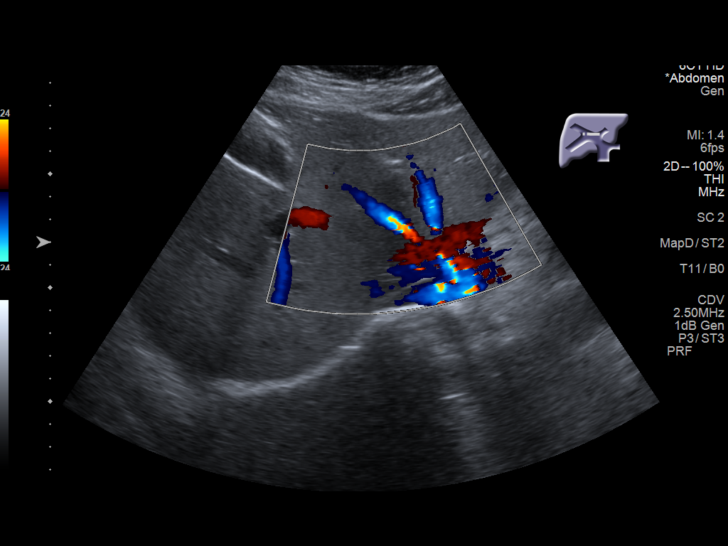
[im 55/119]
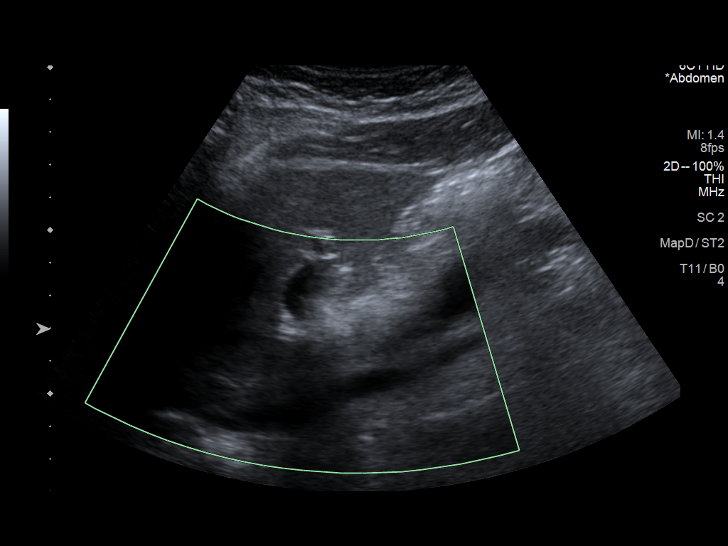
[im 64/119]
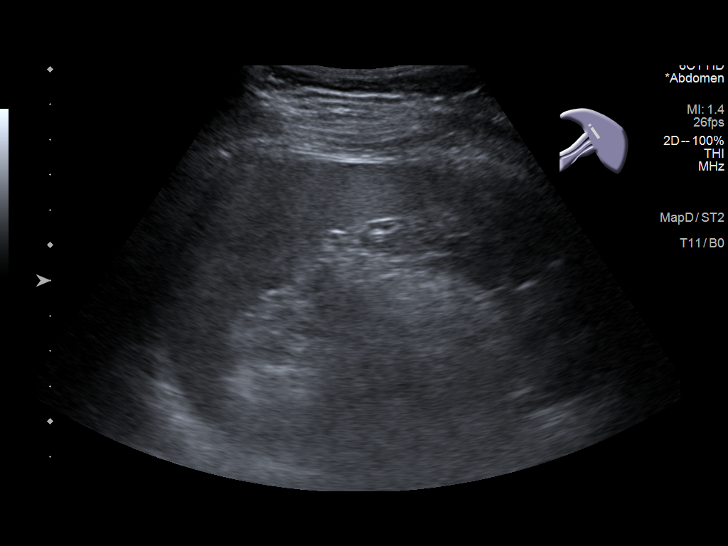
[im 74/119]
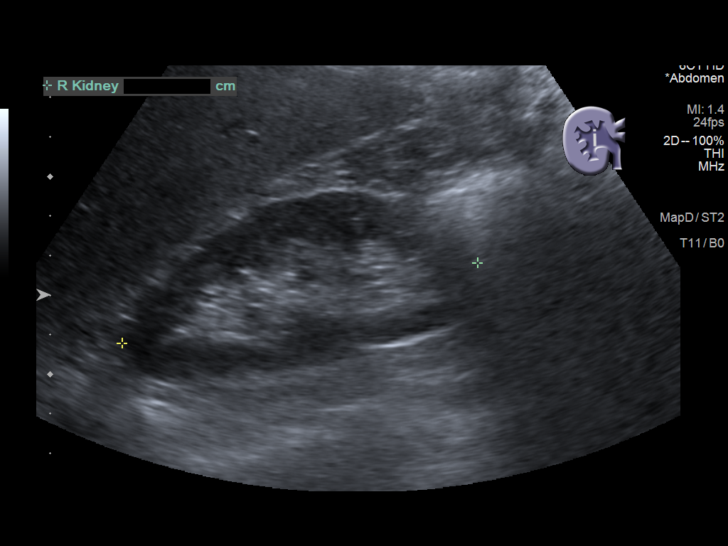
[im 79/119]
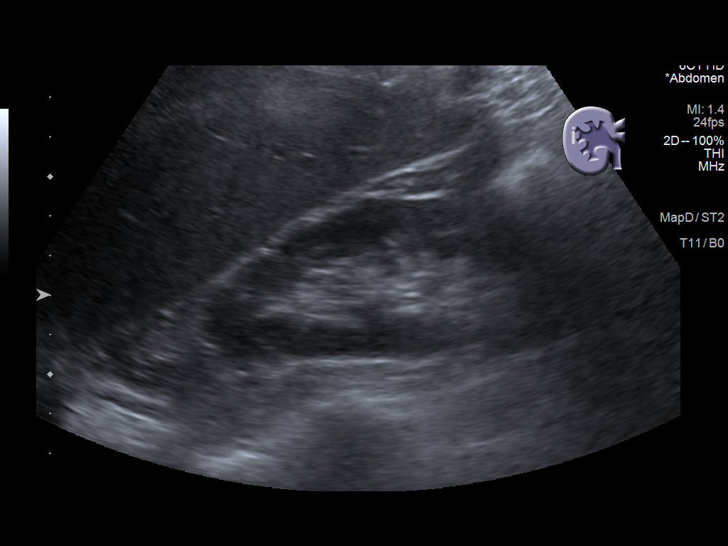
[im 89/119]
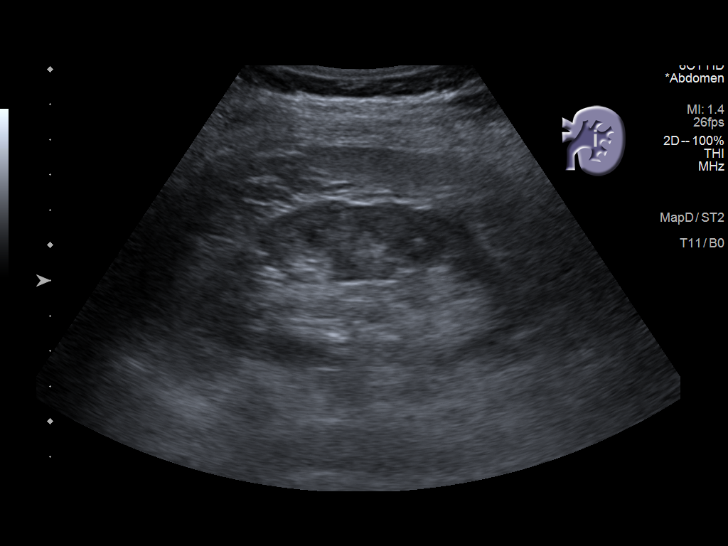
[im 99/119]
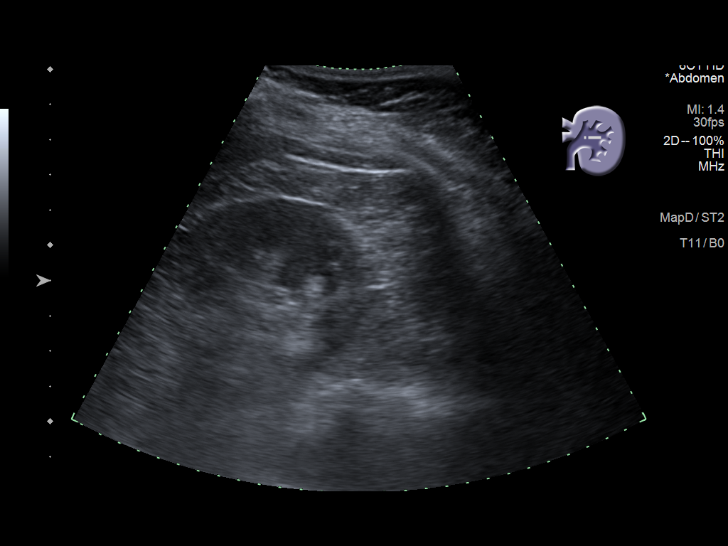
[im 109/119]
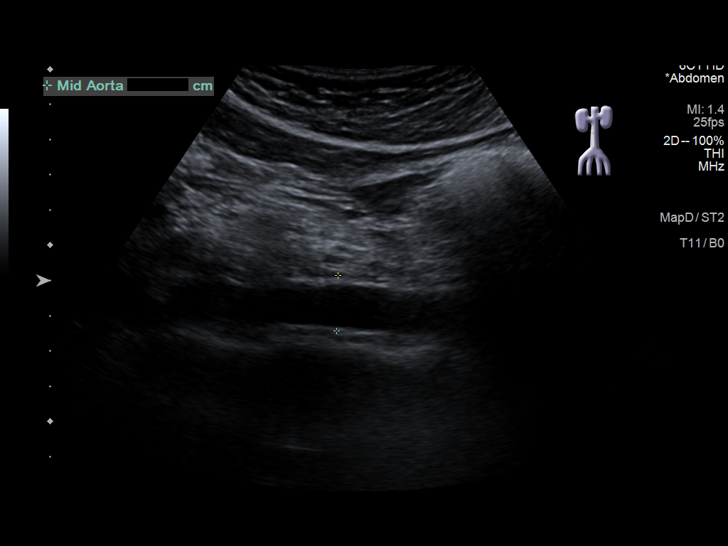
[im 119/119]
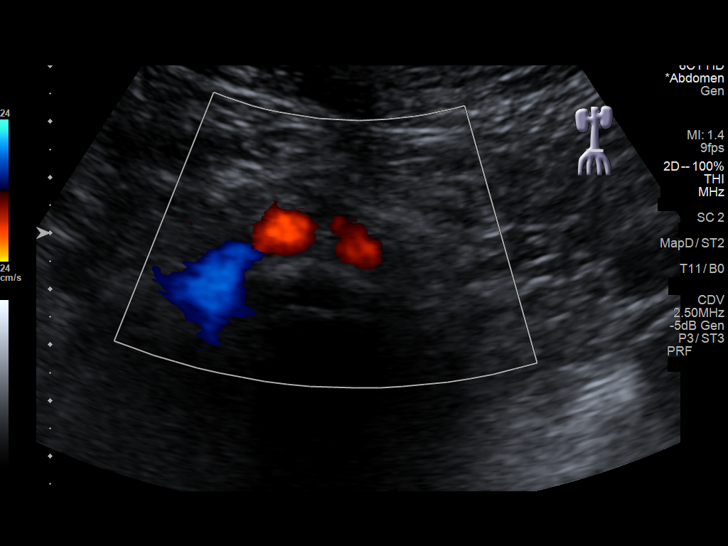

[14 of 25 positions shown; findings below may reference images not displayed]

FINDINGS: Gallbladder: No gallstones or wall thickening visualized. No
sonographic Murphy sign noted by sonographer.

Common bile duct: Diameter: 3 mm

Liver: No focal lesion identified. Within normal limits in
parenchymal echogenicity. Portal vein is patent on color Doppler
imaging with normal direction of blood flow towards the liver.

IVC: No abnormality visualized.

Pancreas: Visualized portion unremarkable.

Spleen: Size and appearance within normal limits.

Right Kidney: Length: 10.1 cm. Echogenicity within normal limits. No
mass or hydronephrosis visualized.

Left Kidney: Length: 9.8 cm. Echogenicity within normal limits. No
mass or hydronephrosis visualized.

Abdominal aorta: No aneurysm visualized.

Other findings: None.
IMPRESSION: Negative examination

## 2022-06-07 MED ORDER — CLINDAMYCIN PHOSPHATE 1 % EX SOLN: CUTANEOUS | 5 refills | Status: DC

## 2022-06-07 MED ORDER — KETOCONAZOLE 2 % EX SHAM: 1.0000 | MEDICATED_SHAMPOO | CUTANEOUS | 11 refills | Status: DC

## 2022-06-07 MED ORDER — DOXYCYCLINE HYCLATE 20 MG PO TABS: 20.0000 mg | ORAL_TABLET | Freq: Two times a day (BID) | ORAL | 5 refills | Status: DC

## 2022-06-07 NOTE — Patient Instructions (Signed)
Due to recent changes in healthcare laws, you may see results of your pathology and/or laboratory studies on MyChart before the doctors have had a chance to review them. We understand that in some cases there may be results that are confusing or concerning to you. Please understand that not all results are received at the same time and often the doctors may need to interpret multiple results in order to provide you with the best plan of care or course of treatment. Therefore, we ask that you please give us 2 business days to thoroughly review all your results before contacting the office for clarification. Should we see a critical lab result, you will be contacted sooner.   If You Need Anything After Your Visit  If you have any questions or concerns for your doctor, please call our main line at [phone] and press option 4 to reach your doctor's medical assistant. If no one answers, please leave a voicemail as directed and we will return your call as soon as possible. Messages left after 4 pm will be answered the following business day.   You may also send us a message via MyChart. We typically respond to MyChart messages within 1-2 business days.  For prescription refills, please ask your pharmacy to contact our office. Our fax number is [phone].  If you have an urgent issue when the clinic is closed that cannot wait until the next business day, you can page your doctor at the number below.    Please note that while we do our best to be available for urgent issues outside of office hours, we are not available 24/7.   If you have an urgent issue and are unable to reach us, you may choose to seek medical care at your doctor's office, retail clinic, urgent care center, or emergency room.  If you have a medical emergency, please immediately call 911 or go to the emergency department.  Pager Numbers  - Dr. Kowalski: [phone]  - Dr. Moye: [phone]  - Dr. Stewart:  [phone]  In the event of inclement weather, please call our main line at [phone] for an update on the status of any delays or closures.  Dermatology Medication Tips: Please keep the boxes that topical medications come in in order to help keep track of the instructions about where and how to use these. Pharmacies typically print the medication instructions only on the boxes and not directly on the medication tubes.   If your medication is too expensive, please contact our office at [phone] option 4 or send us a message through MyChart.   We are unable to tell what your co-pay for medications will be in advance as this is different depending on your insurance coverage. However, we may be able to find a substitute medication at lower cost or fill out paperwork to get insurance to cover a needed medication.   If a prior authorization is required to get your medication covered by your insurance company, please allow us 1-2 business days to complete this process.  Drug prices often vary depending on where the prescription is filled and some pharmacies may offer cheaper prices.  The website www.goodrx.com contains coupons for medications through different pharmacies. The prices here do not account for what the cost may be with help from insurance (it may be cheaper with your insurance), but the website can give you the price if you did not use any insurance.  - You can print the associated coupon and take it with   your prescription to the pharmacy.  - You may also stop by our office during regular business hours and pick up a GoodRx coupon card.  - If you need your prescription sent electronically to a different pharmacy, notify our office through [facility] MyChart or by phone at [phone] option 4.     Si Usted Necesita Algo Despus de Su Visita  Tambin puede enviarnos un mensaje a travs de MyChart. Por lo general respondemos a los mensajes de MyChart en el transcurso de 1 a 2  das hbiles.  Para renovar recetas, por favor pida a su farmacia que se ponga en contacto con nuestra oficina. Nuestro nmero de fax es el [phone].  Si tiene un asunto urgente cuando la clnica est cerrada y que no puede esperar hasta el siguiente da hbil, puede llamar/localizar a su doctor(a) al nmero que aparece a continuacin.   Por favor, tenga en cuenta que aunque hacemos todo lo posible para estar disponibles para asuntos urgentes fuera del horario de oficina, no estamos disponibles las 24 horas del da, los 7 das de la semana.   Si tiene un problema urgente y no puede comunicarse con nosotros, puede optar por buscar atencin mdica  en el consultorio de su doctor(a), en una clnica privada, en un centro de atencin urgente o en una sala de emergencias.  Si tiene una emergencia mdica, por favor llame inmediatamente al 911 o vaya a la sala de emergencias.  Nmeros de bper  - Dr. Kowalski: [phone]  - Dra. Moye: [phone]  - Dra. Stewart: [phone]  En caso de inclemencias del tiempo, por favor llame a nuestra lnea principal al [phone] para una actualizacin sobre el estado de cualquier retraso o cierre.  Consejos para la medicacin en dermatologa: Por favor, guarde las cajas en las que vienen los medicamentos de uso tpico para ayudarle a seguir las instrucciones sobre dnde y cmo usarlos. Las farmacias generalmente imprimen las instrucciones del medicamento slo en las cajas y no directamente en los tubos del medicamento.   Si su medicamento es muy caro, por favor, pngase en contacto con nuestra oficina llamando al [phone] y presione la opcin 4 o envenos un mensaje a travs de MyChart.   No podemos decirle cul ser su copago por los medicamentos por adelantado ya que esto es diferente dependiendo de la cobertura de su seguro. Sin embargo, es posible que podamos encontrar un medicamento sustituto a menor costo o llenar un formulario para que el  seguro cubra el medicamento que se considera necesario.   Si se requiere una autorizacin previa para que su compaa de seguros cubra su medicamento, por favor permtanos de 1 a 2 das hbiles para completar este proceso.  Los precios de los medicamentos varan con frecuencia dependiendo del lugar de dnde se surte la receta y alguna farmacias pueden ofrecer precios ms baratos.  El sitio web www.goodrx.com tiene cupones para medicamentos de diferentes farmacias. Los precios aqu no tienen en cuenta lo que podra costar con la ayuda del seguro (puede ser ms barato con su seguro), pero el sitio web puede darle el precio si no utiliz ningn seguro.  - Puede imprimir el cupn correspondiente y llevarlo con su receta a la farmacia.  - Tambin puede pasar por nuestra oficina durante el horario de atencin regular y recoger una tarjeta de cupones de GoodRx.  - Si necesita que su receta se enve electrnicamente a una farmacia diferente, informe a nuestra oficina a travs de MyChart de [facility]   o por telfono llamando al [phone] y presione la opcin 4.  

## 2022-06-07 NOTE — Progress Notes (Signed)
Follow-Up Visit   Subjective  Maria [name] is a 31 y.o. female who presents for the following: folliculitis/acne/rosacea (Of the scalp - persistent but improved. 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 Condition has improved and she no longer wakes in the middle of the night itching and scratchin), and Hair loss (Patient c/o chronic and persistent hair loss and would like to discuss treatment options today).  The following portions of the chart were reviewed this encounter and updated as appropriate:   Tobacco  Allergies  Meds  Problems  Med Hx  Surg Hx  Fam Hx     Review of Systems:  No other skin or systemic complaints except as noted in HPI or Assessment and Plan.  Objective  Well appearing patient in no apparent distress; mood and affect are within normal limits.  A focused examination was performed including the face and scalp. Relevant physical exam findings are noted in the Assessment and Plan.  Scalp Xerosis and erythema   Scalp Face clear. Scalp with   Scalp Diffuse thinning of hair.   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   Pt is No longer on Adbry but staying relatively well controlled lately  Recommend CeraVe cream day or a non-fragrance moisturizer daily. Continue Singulair QD and Cetirizine po QD  Pruritus and other somatic complaints Possibly exacerbated by stress associated with caring for her special needs son.  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 Condition is symptomatic / bothersome to patient. Not to goal. but patient feels that it is improved on current treatment.   Continue clindamycin solution twice a day to bumps at scalp.  Continue ketoconazole shampoo alternating with head and shoulders shampoo Continue Doxycycline 20mg  po BID.  Isotretinoin treatment has been discussed several times in past, but pt has declined Isotretinoin treatment.  Doxycycline should be taken with food to prevent nausea. Do not lay down for 30 minutes after taking. Be cautious with sun exposure and use good sun protection while on this medication. Pregnant women should not take this medication.   Related Medications clindamycin (CLEOCIN T) 1 % external solution Apply twice daily to scalp doxycycline (PERIOSTAT) 20 MG tablet Take 1 tablet (20 mg total) by mouth 2 (two) times daily. Take with food  Telogen effluvium Scalp Telogen effluvium is a benign, self-limited condition causing increased hair shedding usually for several months. It does not progress to baldness, and the hair eventually grows back on its own. It can be triggered by recent illness, recent surgery, thyroid disease, low iron stores, vitamin D deficiency, fad diets or rapid weight loss, hormonal changes such as pregnancy or birth control pills, and some medication. Usually the hair loss starts 2-3 months after the illness or health change. Rarely, it can continue for longer than a year.  Pt declines any additional treatments.  Return in about 6 months (around 12/07/2022) for follow up .  Luther Redo, CMA, am acting  as scribe for Sarina Ser, MD . Documentation: I have reviewed the above documentation for accuracy and completeness, and I agree with the above.  Sarina Ser, MD

## 2022-06-08 ENCOUNTER — Ambulatory Visit: Payer: Medicaid Other | Admitting: Dermatology

## 2022-06-17 ENCOUNTER — Encounter: Payer: Self-pay | Admitting: Dermatology

## 2022-08-29 ENCOUNTER — Other Ambulatory Visit: Payer: Self-pay | Admitting: Dermatology

## 2022-08-29 DIAGNOSIS — L509 Urticaria, unspecified: Secondary | ICD-10-CM

## 2022-09-08 IMAGING — CR DG CHEST 2V
1 series · 2 of 2 positions shown · non-contrast
Comparison: Radiograph November 16, 2020.

CLINICAL DATA: Cough x5 days.

EXAM:
CHEST - 2 VIEW

[Series 1: dg chest 2 view · 0.14mm/px · 2 of 2 slices shown]
[im 1/2]
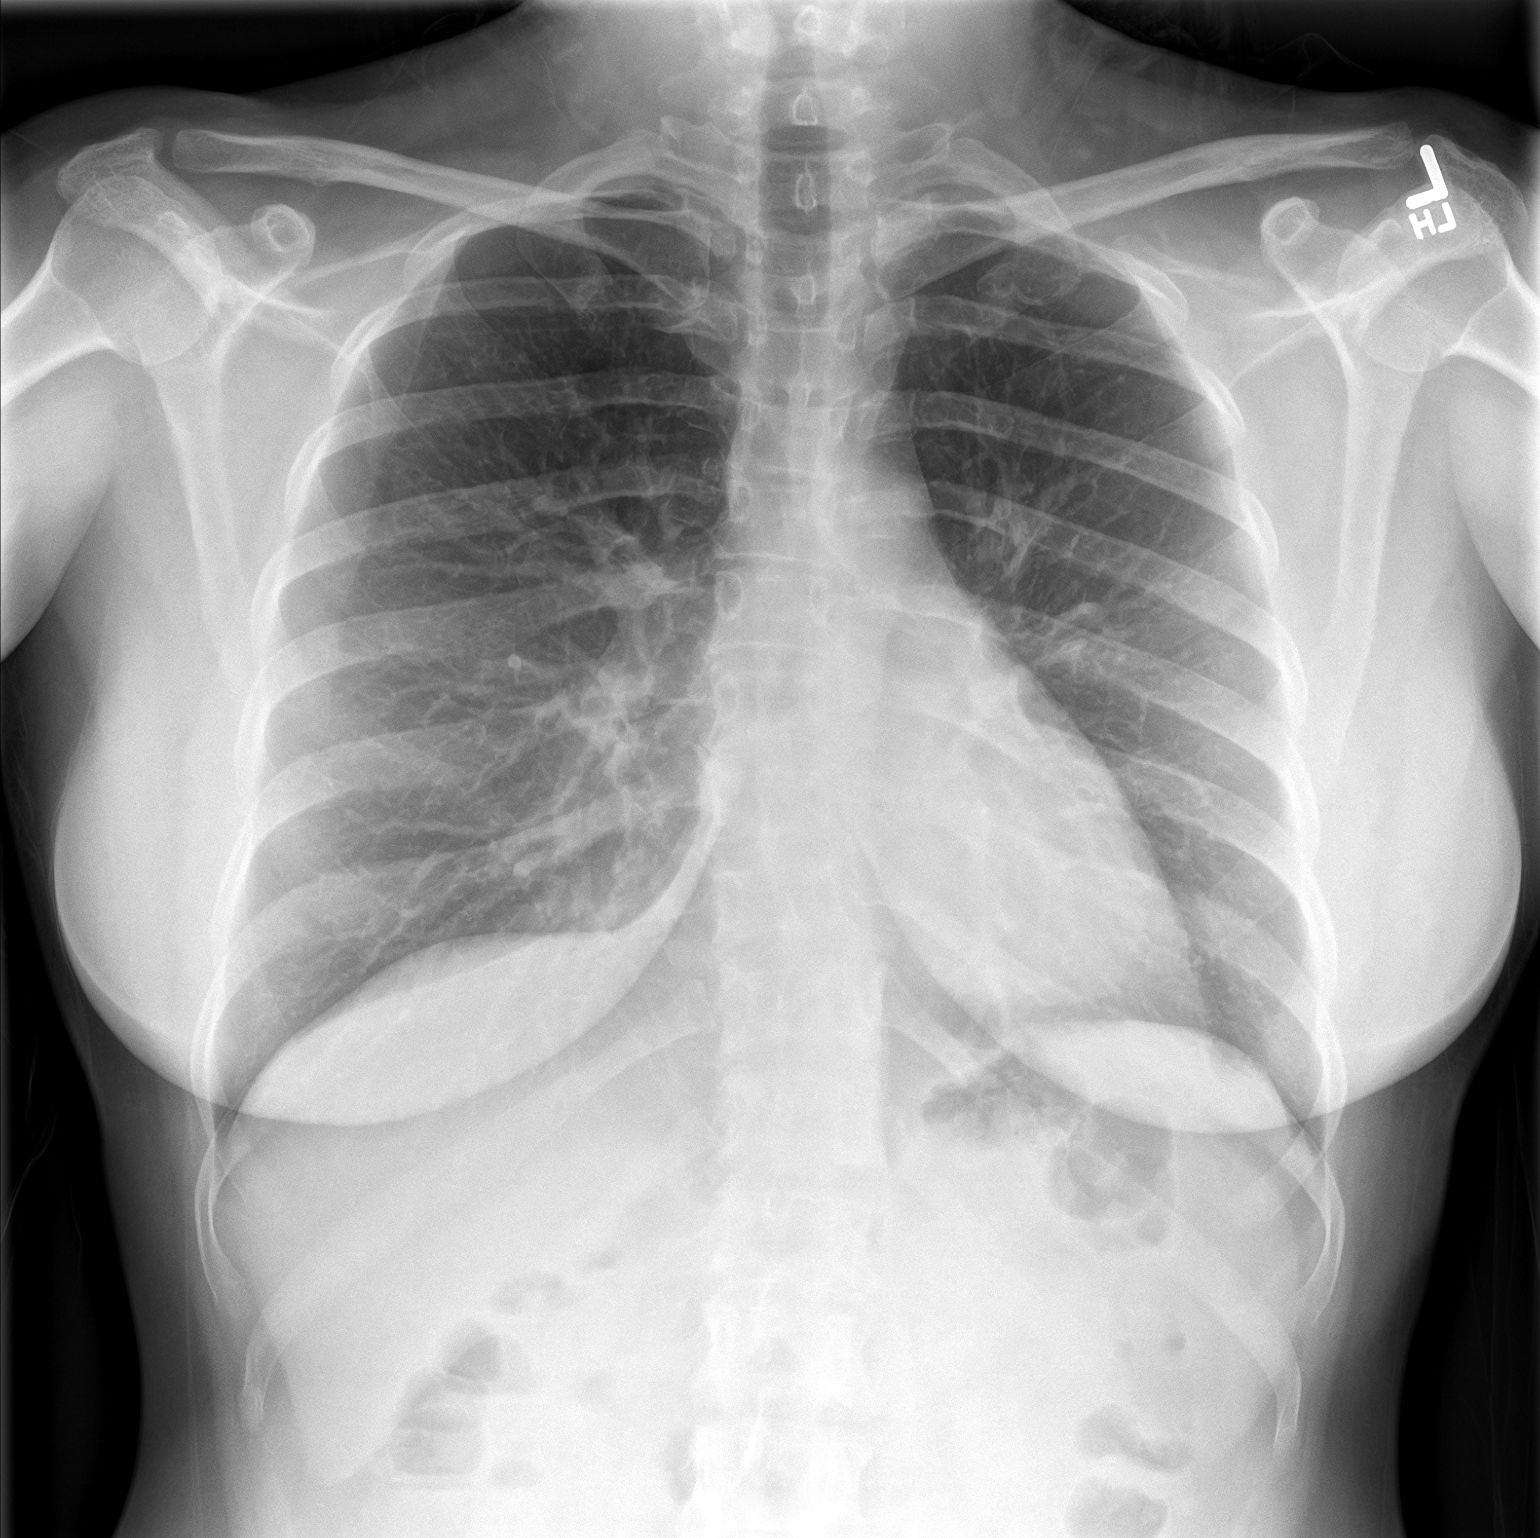
[im 2/2]
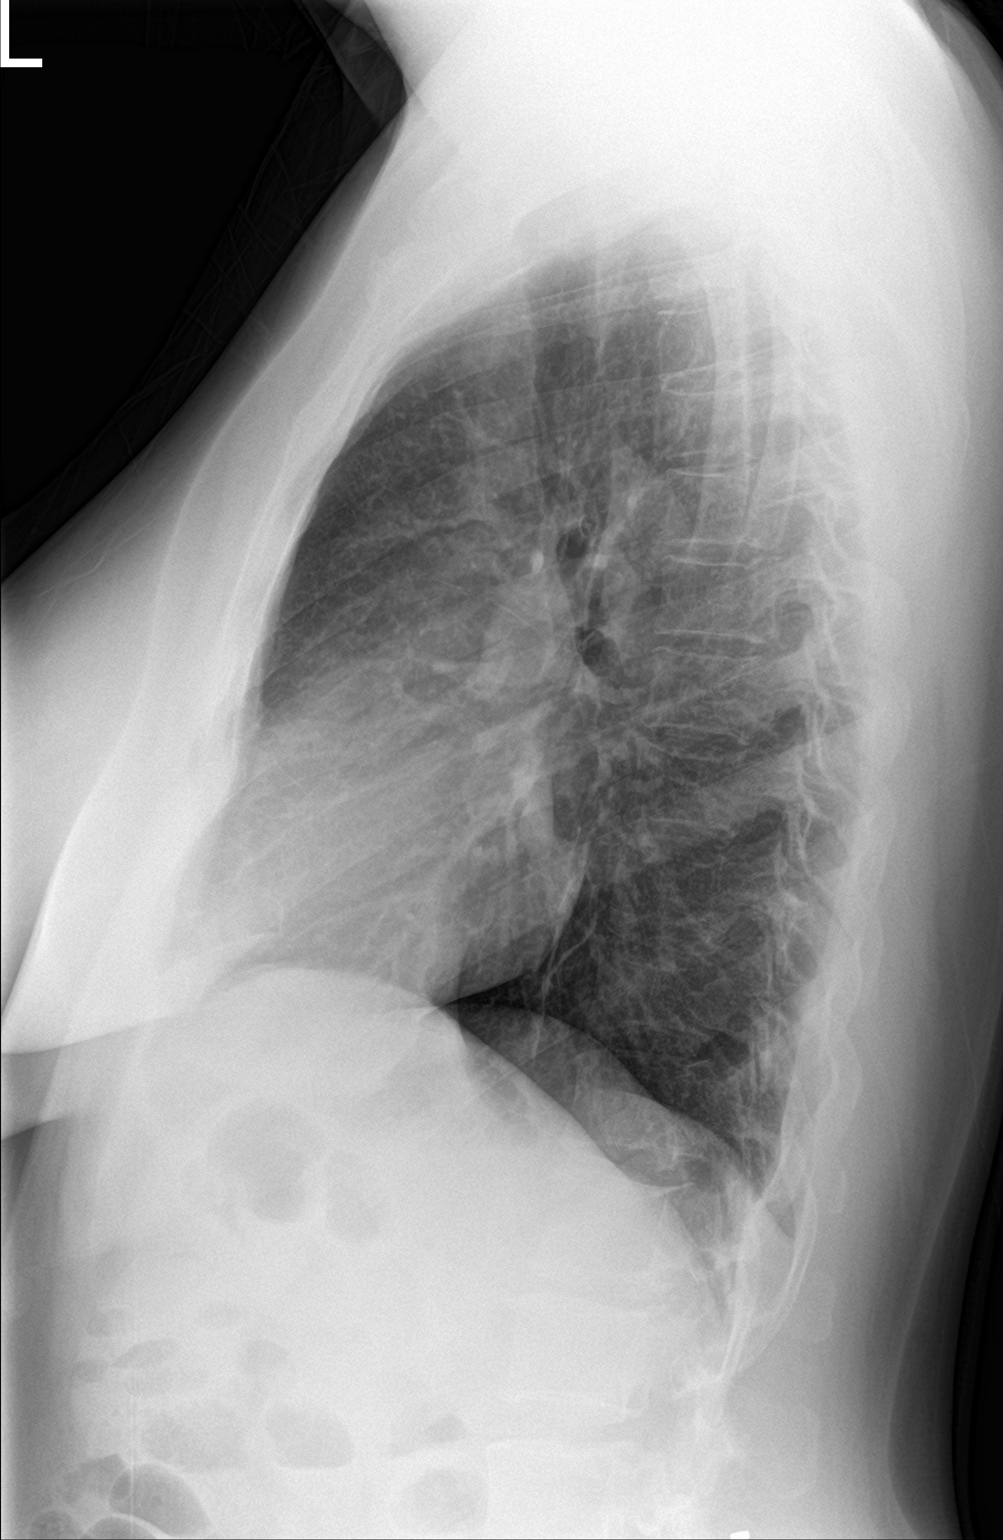

[2 of 2 positions shown; findings below may reference images not displayed]

FINDINGS: The heart size and mediastinal contours are within normal limits.
Perihilar predominant interstitial thickening. No focal airspace
consolidation. No pleural effusion or pneumothorax. The visualized
skeletal structures are unremarkable.
IMPRESSION: Perihilar predominant interstitial opacities suggesting viral
process or reactive airways disease in the appropriate clinical
setting. No focal airspace consolidation.

## 2022-11-06 ENCOUNTER — Ambulatory Visit (INDEPENDENT_AMBULATORY_CARE_PROVIDER_SITE_OTHER): Payer: Medicaid Other | Admitting: Physician Assistant

## 2022-11-06 VITALS — Ht 62.0 in | Wt 158.0 lb

## 2022-11-06 DIAGNOSIS — R3916 Straining to void: Secondary | ICD-10-CM

## 2022-11-06 LAB — MICROSCOPIC EXAMINATION

## 2022-11-06 LAB — URINALYSIS, COMPLETE
Bilirubin, UA: NEGATIVE
Glucose, UA: NEGATIVE
Ketones, UA: NEGATIVE
Leukocytes,UA: NEGATIVE
Nitrite, UA: NEGATIVE
Protein,UA: NEGATIVE
Specific Gravity, UA: 1.025 (ref 1.005–1.030)
Urobilinogen, Ur: 0.2 mg/dL (ref 0.2–1.0)
pH, UA: 5.5 (ref 5.0–7.5)

## 2022-11-06 LAB — BLADDER SCAN AMB NON-IMAGING: Scan Result: 54

## 2022-11-06 MED ORDER — DIAZEPAM 5 MG PO TABS: ORAL_TABLET | ORAL | 0 refills | Status: DC

## 2022-11-06 NOTE — Progress Notes (Unsigned)
11/06/2022 1:50 PM   Maria [name] 1991/08/15 [id number]  CC: Chief Complaint  Patient presents with   Follow-up   HPI: Maria [name] is a 32 y.o. female with PMH chronic low pelvic pain, dyspareunia, and occasional dysuria who presents today for evaluation of urinary retention.   She saw Dr. Diamantina Providence in clinic on 11/12/2019 in consultation for her chronic pelvic symptoms.  She was prescribed vaginal Valium suppositories, referred to pelvic floor PT, and scheduled for return visit in 8 weeks, however she was subsequently lost to follow-up.  She completed 11/14 pelvic PT visits.  Today she reports she has continued to have occasional bladder infections and yeast infections, managed primarily by her OB/GYN.  Today, her dysuria is minimally bothersome and she is mostly concerned by the sensation of straining to void.  She states it "feels like she is pushing her bladder up."  This has been ongoing for several weeks.  She denies vaginal bulging or pressure.  She has a history of chronic constipation and takes Linzess about every other day.  She admits to some constipation between doses.  She reports she continues to to do home pelvic PT exercises and that these did help her.  In-office UA today positive for trace lysed blood; urine microscopy with granular casts. PVR 17mL.  PMH: Past Medical History:  Diagnosis Date   Acute pancreatitis    Asthma    GERD (gastroesophageal reflux disease)    Hemorrhoid    Hx of varicella    Ovarian cyst    Recurrent boils    legs and buttocks.  staff infection, was neg   Thrombocytosis 06/09/2016   UTI (urinary tract infection)     Surgical History: Past Surgical History:  Procedure Laterality Date   CESAREAN SECTION N/A 05/25/2016   Procedure: CESAREAN SECTION;  Surgeon: Christophe Louis, MD;  Location: [facility];  Service: Obstetrics;  Laterality: N/A;   FLEXIBLE SIGMOIDOSCOPY N/A 11/30/2021   Procedure: FLEXIBLE  SIGMOIDOSCOPY;  Surgeon: Lin Landsman, MD;  [facility] ENDOSCOPY;  Service: Gastroenterology;  Laterality: N/A;   TONSILLECTOMY     WISDOM TOOTH EXTRACTION      Home Medications:  Allergies as of 11/06/2022       Reactions   Amoxicillin Rash   Penicillins Hives, Other (See Comments)   Has patient had a PCN reaction causing immediate rash, facial/tongue/throat swelling, SOB or lightheadedness with hypotension: No Has patient had a PCN reaction causing severe rash involving mucus membranes or skin necrosis: No Has patient had a PCN reaction that required hospitalization No Has patient had a PCN reaction occurring within the last 10 years: No If all of the above answers are "NO", then may proceed with Cephalosporin use.        Medication List        Accurate as of November 06, 2022  1:50 PM. If you have any questions, ask your nurse or doctor.          STOP taking these medications    clonazePAM 0.5 MG tablet Commonly known as: KLONOPIN   Mobic 15 MG tablet Generic drug: meloxicam   oxyCODONE-acetaminophen 5-325 MG tablet Commonly known as: PERCOCET/ROXICET       TAKE these medications    Adbry 150 MG/ML Sosy Generic drug: Tralokinumab-ldrm Inject into the skin.   albuterol 108 (90 Base) MCG/ACT inhaler Commonly known as: VENTOLIN HFA Inhale 2 puffs into the lungs every 6 (six) hours as needed for wheezing or shortness of  breath.   cetirizine 10 MG tablet Commonly known as: ZYRTEC Take 1 tablet by mouth daily.   clindamycin 1 % external solution Commonly known as: CLEOCIN T Apply twice daily to scalp   cyclobenzaprine 10 MG tablet Commonly known as: FLEXERIL Take 1 tablet by mouth every 8 (eight) hours.   D3-50 1.25 MG (50000 UT) capsule Generic drug: Cholecalciferol Take 50,000 Units by mouth once a week.   DentaGel 1.1 % Gel dental gel Generic drug: sodium fluoride Place onto teeth daily.   doxycycline 20 MG tablet Commonly known as:  PERIOSTAT Take 1 tablet (20 mg total) by mouth 2 (two) times daily. Take with food   ketoconazole 2 % shampoo Commonly known as: NIZORAL Apply 1 Application topically as directed. Shampoo scalp and face, avoid eye,  at least 3 times weekly, let sit several minutes and rinse out   linaclotide 72 MCG capsule Commonly known as: LINZESS Take 72 mcg by mouth daily before breakfast.   medroxyPROGESTERone Acetate 150 MG/ML Susy Inject 1 mL into the muscle every 3 (three) months.   montelukast 10 MG tablet Commonly known as: SINGULAIR TAKE 1 TABLET BY MOUTH EVERYDAY AT BEDTIME   ondansetron 4 MG tablet Commonly known as: ZOFRAN Take 1 tablet (4 mg total) by mouth every 8 (eight) hours as needed for nausea or vomiting.   pantoprazole 40 MG tablet Commonly known as: PROTONIX Take 40 mg by mouth daily.        Allergies:  Allergies  Allergen Reactions   Amoxicillin Rash   Penicillins Hives and Other (See Comments)    Has patient had a PCN reaction causing immediate rash, facial/tongue/throat swelling, SOB or lightheadedness with hypotension: No Has patient had a PCN reaction causing severe rash involving mucus membranes or skin necrosis: No Has patient had a PCN reaction that required hospitalization No Has patient had a PCN reaction occurring within the last 10 years: No If all of the above answers are "NO", then may proceed with Cephalosporin use.     Family History: Family History  Problem Relation Age of Onset   Hypertension Maternal Grandmother    Heart disease Maternal Grandfather    Stroke Maternal Grandfather    Diabetes Neg Hx    Cancer Neg Hx     Social History:   reports that she quit smoking about 9 years ago. Her smoking use included cigarettes. She smoked an average of .5 packs per day. She has quit using smokeless tobacco. She reports current alcohol use of about 1.0 standard drink of alcohol per week. She reports that she does not use drugs.  Physical  Exam: Ht 5\' 2"  (1.575 m)   Wt 158 lb (71.7 kg)   BMI 28.90 kg/m   Constitutional:  Alert and oriented, no acute distress, nontoxic appearing HEENT: [place], AT Cardiovascular: No clubbing, cyanosis, or edema Respiratory: Normal respiratory effort, no increased work of breathing Skin: No rashes, bruises or suspicious lesions Neurologic: Grossly intact, no focal deficits, moving all 4 extremities Psychiatric: Normal mood and affect  Laboratory Data: Results for orders placed or performed in visit on 11/06/22  Microscopic Examination   Urine  Result Value Ref Range   WBC, UA 0-5 0 - 5 /hpf   RBC, Urine 0-2 0 - 2 /hpf   Epithelial Cells (non renal) 0-10 0 - 10 /hpf   Casts Present (A) None seen /lpf   Cast Type Granular casts (A) N/A   Mucus, UA Present (A) Not Estab.   Bacteria,  UA Few None seen/Few  Urinalysis, Complete  Result Value Ref Range   Specific Gravity, UA 1.025 1.005 - 1.030   pH, UA 5.5 5.0 - 7.5   Color, UA Yellow Yellow   Appearance Ur Clear Clear   Leukocytes,UA Negative Negative   Protein,UA Negative Negative/Trace   Glucose, UA Negative Negative   Ketones, UA Negative Negative   RBC, UA Trace (A) Negative   Bilirubin, UA Negative Negative   Urobilinogen, Ur 0.2 0.2 - 1.0 mg/dL   Nitrite, UA Negative Negative   Microscopic Examination See below:   BLADDER SCAN AMB NON-IMAGING  Result Value Ref Range   Scan Result 54 ml    Assessment & Plan:   1. Straining to void UA bland, PVR WNL.  We discussed that this is likely multifactorial in the setting of her chronic constipation and pelvic floor dysfunction.  I offered her a trial of Flomax, repeat trial of vaginal Valium, versus referral back to pelvic floor PT.  She wishes to try Valium again first, she does not remember if it was helpful last time.  She may follow-up as needed. - Urinalysis, Complete - BLADDER SCAN AMB NON-IMAGING - diazepam (VALIUM) 5 MG tablet; Place one tablet vaginally before bed nightly or  as needed for straining with urination.  Dispense: 30 tablet; Refill: 0   Return if symptoms worsen or fail to improve.  Debroah Loop, PA-C  [facility] [address], [place], [place] [address] [phone]

## 2022-12-12 ENCOUNTER — Ambulatory Visit (INDEPENDENT_AMBULATORY_CARE_PROVIDER_SITE_OTHER): Payer: Medicaid Other | Admitting: Dermatology

## 2022-12-12 VITALS — BP 141/88 | HR 85

## 2022-12-12 DIAGNOSIS — Z7189 Other specified counseling: Secondary | ICD-10-CM

## 2022-12-12 DIAGNOSIS — L739 Follicular disorder, unspecified: Secondary | ICD-10-CM

## 2022-12-12 DIAGNOSIS — Z79899 Other long term (current) drug therapy: Secondary | ICD-10-CM

## 2022-12-12 DIAGNOSIS — L299 Pruritus, unspecified: Secondary | ICD-10-CM | POA: Diagnosis not present

## 2022-12-12 DIAGNOSIS — L2081 Atopic neurodermatitis: Secondary | ICD-10-CM | POA: Diagnosis not present

## 2022-12-12 MED ORDER — TRALOKINUMAB-LDRM 150 MG/ML ~~LOC~~ SOSY
600.0000 mg | PREFILLED_SYRINGE | Freq: Once | SUBCUTANEOUS | Status: AC
Start: 2022-12-12 — End: 2022-12-12
  Administered 2022-12-12: 600 mg via SUBCUTANEOUS

## 2022-12-12 MED ORDER — ADBRY 150 MG/ML ~~LOC~~ SOSY: 300.0000 mg | PREFILLED_SYRINGE | SUBCUTANEOUS | 5 refills | Status: DC

## 2022-12-12 NOTE — Patient Instructions (Signed)
Due to recent changes in healthcare laws, you may see results of your pathology and/or laboratory studies on MyChart before the doctors have had a chance to review them. We understand that in some cases there may be results that are confusing or concerning to you. Please understand that not all results are received at the same time and often the doctors may need to interpret multiple results in order to provide you with the best plan of care or course of treatment. Therefore, we ask that you please give us 2 business days to thoroughly review all your results before contacting the office for clarification. Should we see a critical lab result, you will be contacted sooner.   If You Need Anything After Your Visit  If you have any questions or concerns for your doctor, please call our main line at [phone] and press option 4 to reach your doctor's medical assistant. If no one answers, please leave a voicemail as directed and we will return your call as soon as possible. Messages left after 4 pm will be answered the following business day.   You may also send us a message via MyChart. We typically respond to MyChart messages within 1-2 business days.  For prescription refills, please ask your pharmacy to contact our office. Our fax number is [phone].  If you have an urgent issue when the clinic is closed that cannot wait until the next business day, you can page your doctor at the number below.    Please note that while we do our best to be available for urgent issues outside of office hours, we are not available 24/7.   If you have an urgent issue and are unable to reach us, you may choose to seek medical care at your doctor's office, retail clinic, urgent care center, or emergency room.  If you have a medical emergency, please immediately call 911 or go to the emergency department.  Pager Numbers  - Dr. Kowalski: [phone]  - Dr. Moye: [phone]  - Dr. Stewart:  [phone]  In the event of inclement weather, please call our main line at [phone] for an update on the status of any delays or closures.  Dermatology Medication Tips: Please keep the boxes that topical medications come in in order to help keep track of the instructions about where and how to use these. Pharmacies typically print the medication instructions only on the boxes and not directly on the medication tubes.   If your medication is too expensive, please contact our office at [phone] option 4 or send us a message through MyChart.   We are unable to tell what your co-pay for medications will be in advance as this is different depending on your insurance coverage. However, we may be able to find a substitute medication at lower cost or fill out paperwork to get insurance to cover a needed medication.   If a prior authorization is required to get your medication covered by your insurance company, please allow us 1-2 business days to complete this process.  Drug prices often vary depending on where the prescription is filled and some pharmacies may offer cheaper prices.  The website www.goodrx.com contains coupons for medications through different pharmacies. The prices here do not account for what the cost may be with help from insurance (it may be cheaper with your insurance), but the website can give you the price if you did not use any insurance.  - You can print the associated coupon and take it with   your prescription to the pharmacy.  - You may also stop by our office during regular business hours and pick up a GoodRx coupon card.  - If you need your prescription sent electronically to a different pharmacy, notify our office through [facility] MyChart or by phone at [phone] option 4.     Si Usted Necesita Algo Despus de Su Visita  Tambin puede enviarnos un mensaje a travs de MyChart. Por lo general respondemos a los mensajes de MyChart en el transcurso de 1 a 2  das hbiles.  Para renovar recetas, por favor pida a su farmacia que se ponga en contacto con nuestra oficina. Nuestro nmero de fax es el [phone].  Si tiene un asunto urgente cuando la clnica est cerrada y que no puede esperar hasta el siguiente da hbil, puede llamar/localizar a su doctor(a) al nmero que aparece a continuacin.   Por favor, tenga en cuenta que aunque hacemos todo lo posible para estar disponibles para asuntos urgentes fuera del horario de oficina, no estamos disponibles las 24 horas del da, los 7 das de la semana.   Si tiene un problema urgente y no puede comunicarse con nosotros, puede optar por buscar atencin mdica  en el consultorio de su doctor(a), en una clnica privada, en un centro de atencin urgente o en una sala de emergencias.  Si tiene una emergencia mdica, por favor llame inmediatamente al 911 o vaya a la sala de emergencias.  Nmeros de bper  - Dr. Kowalski: [phone]  - Dra. Moye: [phone]  - Dra. Stewart: [phone]  En caso de inclemencias del tiempo, por favor llame a nuestra lnea principal al [phone] para una actualizacin sobre el estado de cualquier retraso o cierre.  Consejos para la medicacin en dermatologa: Por favor, guarde las cajas en las que vienen los medicamentos de uso tpico para ayudarle a seguir las instrucciones sobre dnde y cmo usarlos. Las farmacias generalmente imprimen las instrucciones del medicamento slo en las cajas y no directamente en los tubos del medicamento.   Si su medicamento es muy caro, por favor, pngase en contacto con nuestra oficina llamando al [phone] y presione la opcin 4 o envenos un mensaje a travs de MyChart.   No podemos decirle cul ser su copago por los medicamentos por adelantado ya que esto es diferente dependiendo de la cobertura de su seguro. Sin embargo, es posible que podamos encontrar un medicamento sustituto a menor costo o llenar un formulario para que el  seguro cubra el medicamento que se considera necesario.   Si se requiere una autorizacin previa para que su compaa de seguros cubra su medicamento, por favor permtanos de 1 a 2 das hbiles para completar este proceso.  Los precios de los medicamentos varan con frecuencia dependiendo del lugar de dnde se surte la receta y alguna farmacias pueden ofrecer precios ms baratos.  El sitio web www.goodrx.com tiene cupones para medicamentos de diferentes farmacias. Los precios aqu no tienen en cuenta lo que podra costar con la ayuda del seguro (puede ser ms barato con su seguro), pero el sitio web puede darle el precio si no utiliz ningn seguro.  - Puede imprimir el cupn correspondiente y llevarlo con su receta a la farmacia.  - Tambin puede pasar por nuestra oficina durante el horario de atencin regular y recoger una tarjeta de cupones de GoodRx.  - Si necesita que su receta se enve electrnicamente a una farmacia diferente, informe a nuestra oficina a travs de MyChart de [facility]   o por telfono llamando al [phone] y presione la opcin 4.  

## 2022-12-12 NOTE — Progress Notes (Signed)
   Follow-Up Visit   Subjective  Maria [name] is a 32 y.o. 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 She sometimes uses Benadryl. Folliculitis/acne/rosacea of the scalp - pt states that she still has bumps in the scalp and itching at night.  She feels like topical Clindamycin helps her scalp folliculitis significantly. Pt is currently using Doxycycline 100 mg po BID x 10 days (due to having a splinter removed from her foot).  Pt c/o itching all over on sporadic places on the body for which she uses Benadryl. Pt is under a lot of stress caring for her autistic son and notices that itch is worse when she is very stressed.   The following portions of the chart were reviewed this encounter and updated as appropriate: medications, allergies, medical history  Review of Systems:  No other skin or systemic complaints except as noted in HPI or Assessment and Plan.  Objective  Well appearing patient in no apparent distress; mood and affect are within normal limits. A focused examination was performed of the following areas: the face, scalp, and arms Relevant exam findings are noted in the Assessment and Plan.   Assessment & Plan   Atopic neurodermatitis With history of severe pruritus Scalp   Patient no longer waking at night itching and scratching, but she still experiences erythema, xerosis, and pruritus -   Pt has been treated with Dupixent and Adbry with some improvement in past. We discussed restarting Adbry for itch and patient is interested in restarting medication today.   Recommend CeraVe cream day or a non-fragrance moisturizer daily.  Continue Singulair QD and Cetirizine po QD  Adbry 150mg /mL injected SQ into L upper arm x 4 for a total of 600mg /mL. Patient tolerated injections well. AW, RMA   Will check IgE levels today.   Pruritus and other somatic complaints  Possibly  exacerbated by stress associated with caring for her special needs son.   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 Condition is symptomatic / bothersome to patient. Not to goal. but patient feels that it is improved on current treatment.   Continue clindamycin solution twice a day to bumps at scalp.  Continue ketoconazole shampoo alternating with head and shoulders shampoo Consider re-starting Doxycycline 20mg  po BID if above meds do not control folliculitis (after finishing Doxycycline 100 mg po BID x 10 days).  Isotretinoin treatment has been discussed several times in past, but pt has declined Isotretinoin treatment.   Doxycycline should be taken with food to prevent nausea. Do not lay down for 30 minutes after taking. Be cautious with sun exposure and use good sun protection while on this medication. Pregnant women should not take this medication.   Return in about 2 weeks (around 12/26/2022) for Adbry follow up and injections.  Maylene Roes, CMA, am acting as scribe for Maria Sans, MD . Documentation: I have reviewed the above documentation for accuracy and completeness, and I agree with the above.  Maria Sans, MD

## 2022-12-16 LAB — IGE: IgE (Immunoglobulin E), Serum: 146 IU/mL (ref 6–495)

## 2022-12-19 ENCOUNTER — Encounter: Payer: Self-pay | Admitting: Dermatology

## 2022-12-19 ENCOUNTER — Telehealth: Payer: Self-pay

## 2022-12-19 NOTE — Telephone Encounter (Signed)
-----   Message from Deirdre Evener, MD sent at 12/17/2022  2:27 PM EDT ----- Lab from 12/12/2022 shows: Normal IgE level. Therefore the pts itching is unrelated to an elevated IgE. Continue current plan of treatment and follow up.

## 2022-12-19 NOTE — Telephone Encounter (Signed)
Patient informed of lab results. 

## 2022-12-27 ENCOUNTER — Other Ambulatory Visit: Payer: Self-pay

## 2022-12-27 ENCOUNTER — Ambulatory Visit: Payer: Self-pay | Admitting: *Deleted

## 2022-12-27 ENCOUNTER — Ambulatory Visit (INDEPENDENT_AMBULATORY_CARE_PROVIDER_SITE_OTHER): Payer: Medicaid Other | Admitting: Dermatology

## 2022-12-27 ENCOUNTER — Emergency Department
Admission: EM | Admit: 2022-12-27 | Discharge: 2022-12-27 | Disposition: A | Discharge status: Home / Self Care | Payer: Medicaid Other | Attending: Emergency Medicine | Admitting: Emergency Medicine

## 2022-12-27 ENCOUNTER — Emergency Department: Payer: Medicaid Other

## 2022-12-27 VITALS — BP 147/104 | HR 93

## 2022-12-27 DIAGNOSIS — L2081 Atopic neurodermatitis: Secondary | ICD-10-CM | POA: Diagnosis not present

## 2022-12-27 DIAGNOSIS — G43809 Other migraine, not intractable, without status migrainosus: Secondary | ICD-10-CM | POA: Insufficient documentation

## 2022-12-27 DIAGNOSIS — J45909 Unspecified asthma, uncomplicated: Secondary | ICD-10-CM | POA: Diagnosis not present

## 2022-12-27 DIAGNOSIS — L708 Other acne: Secondary | ICD-10-CM

## 2022-12-27 DIAGNOSIS — K0889 Other specified disorders of teeth and supporting structures: Secondary | ICD-10-CM | POA: Insufficient documentation

## 2022-12-27 DIAGNOSIS — H9202 Otalgia, left ear: Secondary | ICD-10-CM | POA: Insufficient documentation

## 2022-12-27 DIAGNOSIS — L299 Pruritus, unspecified: Secondary | ICD-10-CM

## 2022-12-27 DIAGNOSIS — L719 Rosacea, unspecified: Secondary | ICD-10-CM

## 2022-12-27 DIAGNOSIS — Z79899 Other long term (current) drug therapy: Secondary | ICD-10-CM

## 2022-12-27 DIAGNOSIS — K089 Disorder of teeth and supporting structures, unspecified: Secondary | ICD-10-CM

## 2022-12-27 DIAGNOSIS — L739 Follicular disorder, unspecified: Secondary | ICD-10-CM | POA: Diagnosis not present

## 2022-12-27 DIAGNOSIS — R079 Chest pain, unspecified: Secondary | ICD-10-CM | POA: Insufficient documentation

## 2022-12-27 DIAGNOSIS — J349 Unspecified disorder of nose and nasal sinuses: Secondary | ICD-10-CM

## 2022-12-27 DIAGNOSIS — L700 Acne vulgaris: Secondary | ICD-10-CM

## 2022-12-27 DIAGNOSIS — R110 Nausea: Secondary | ICD-10-CM | POA: Diagnosis present

## 2022-12-27 LAB — HEPATIC FUNCTION PANEL
ALT: 37 U/L (ref 0–44)
AST: 29 U/L (ref 15–41)
Albumin: 4.6 g/dL (ref 3.5–5.0)
Alkaline Phosphatase: 58 U/L (ref 38–126)
Bilirubin, Direct: 0.1 mg/dL (ref 0.0–0.2)
Indirect Bilirubin: 0.4 mg/dL (ref 0.3–0.9)
Total Bilirubin: 0.5 mg/dL (ref 0.3–1.2)
Total Protein: 7.4 g/dL (ref 6.5–8.1)

## 2022-12-27 LAB — TROPONIN I (HIGH SENSITIVITY)
Troponin I (High Sensitivity): <2 ng/L (ref <18)
Troponin I (High Sensitivity): <2 ng/L (ref <18)

## 2022-12-27 LAB — URINALYSIS, ROUTINE W REFLEX MICROSCOPIC
Bilirubin Urine: NEGATIVE
Glucose, UA: NEGATIVE mg/dL
Hgb urine dipstick: NEGATIVE
Ketones, ur: NEGATIVE mg/dL
Leukocytes,Ua: NEGATIVE
Nitrite: NEGATIVE
Protein, ur: NEGATIVE mg/dL
Specific Gravity, Urine: 1.015 (ref 1.005–1.030)
pH: 5 (ref 5.0–8.0)

## 2022-12-27 LAB — POC URINE PREG, ED: Preg Test, Ur: NEGATIVE

## 2022-12-27 LAB — BASIC METABOLIC PANEL
Anion gap: 12 (ref 5–15)
BUN: 9 mg/dL (ref 6–20)
CO2: 21 mmol/L — ABNORMAL LOW (ref 22–32)
Calcium: 9.2 mg/dL (ref 8.9–10.3)
Chloride: 105 mmol/L (ref 98–111)
Creatinine, Ser: 0.99 mg/dL (ref 0.44–1.00)
GFR, Estimated: >60 mL/min (ref >60)
Glucose, Bld: 107 mg/dL — ABNORMAL HIGH (ref 70–99)
Potassium: 4 mmol/L (ref 3.5–5.1)
Sodium: 138 mmol/L (ref 135–145)

## 2022-12-27 LAB — CBC
HCT: 43.6 % (ref 36.0–46.0)
Hemoglobin: 14.4 g/dL (ref 12.0–15.0)
MCH: 30.5 pg (ref 26.0–34.0)
MCHC: 33 g/dL (ref 30.0–36.0)
MCV: 92.4 fL (ref 80.0–100.0)
Platelets: 313 10*3/uL (ref 150–400)
RBC: 4.72 MIL/uL (ref 3.87–5.11)
RDW: 12.8 % (ref 11.5–15.5)
WBC: 7 10*3/uL (ref 4.0–10.5)
nRBC: 0 % (ref 0.0–0.2)

## 2022-12-27 LAB — LIPASE, BLOOD: Lipase: 32 U/L (ref 11–51)

## 2022-12-27 MED ORDER — ACETAMINOPHEN 500 MG PO TABS
1000.0000 mg | ORAL_TABLET | Freq: Once | ORAL | Status: AC
  Administered 2022-12-27: 1000 mg via ORAL
  Filled 2022-12-27: qty 2

## 2022-12-27 MED ORDER — SODIUM CHLORIDE 0.9 % IV BOLUS
1000.0000 mL | Freq: Once | INTRAVENOUS | Status: AC
  Administered 2022-12-27: 1000 mL via INTRAVENOUS

## 2022-12-27 MED ORDER — KETOROLAC TROMETHAMINE 15 MG/ML IJ SOLN
15.0000 mg | Freq: Once | INTRAMUSCULAR | Status: AC
  Administered 2022-12-27: 15 mg via INTRAVENOUS
  Filled 2022-12-27: qty 1

## 2022-12-27 MED ORDER — DIPHENHYDRAMINE HCL 50 MG/ML IJ SOLN
25.0000 mg | Freq: Once | INTRAMUSCULAR | Status: AC
  Administered 2022-12-27: 25 mg via INTRAVENOUS
  Filled 2022-12-27: qty 1

## 2022-12-27 MED ORDER — METOCLOPRAMIDE HCL 5 MG/ML IJ SOLN
10.0000 mg | Freq: Once | INTRAMUSCULAR | Status: AC
  Administered 2022-12-27: 10 mg via INTRAVENOUS
  Filled 2022-12-27: qty 2

## 2022-12-27 MED ORDER — DOXYCYCLINE HYCLATE 20 MG PO TABS: 20.0000 mg | ORAL_TABLET | Freq: Two times a day (BID) | ORAL | 0 refills | Status: AC

## 2022-12-27 NOTE — Telephone Encounter (Signed)
Summary: dizziness and nauseous   Patient just left her Dermatologist appt at [facility] and they they advised her to see a PCP for her high blood pressure as her pressure was extremely high. Patient said she has been feeling dizzy, nauseous and a headache. Please advise         Reason for Disposition  [1] Systolic BP  >= 160 OR Diastolic >= 100 AND [2] cardiac (e.g., breathing difficulty, chest pain) or neurologic symptoms (e.g., new-onset blurred or double vision, unsteady gait)  Answer Assessment - Initial Assessment Questions 1. BLOOD PRESSURE: "What is the blood pressure?" "Did you take at least two measurements 5 minutes apart?"     Does not remember- just it was high, Chart :141/88 Abnormal  162/109 Abnormal  147/104 Abnormal  2. ONSET: "When did you take your blood pressure?"     Today at dermatology 3. HOW: "How did you take your blood pressure?" (e.g., automatic home BP monitor, visiting nurse)     Provider office 4. HISTORY: "Do you have a history of high blood pressure?"     no 5. MEDICINES: "Are you taking any medicines for blood pressure?" "Have you missed any doses recently?"     no 6. OTHER SYMPTOMS: "Do you have any symptoms?" (e.g., blurred vision, chest pain, difficulty breathing, headache, weakness)     Headache, nausea  Protocols used: Blood Pressure - High-A-AH

## 2022-12-27 NOTE — Patient Instructions (Signed)
Due to recent changes in healthcare laws, you may see results of your pathology and/or laboratory studies on MyChart before the doctors have had a chance to review them. We understand that in some cases there may be results that are confusing or concerning to you. Please understand that not all results are received at the same time and often the doctors may need to interpret multiple results in order to provide you with the best plan of care or course of treatment. Therefore, we ask that you please give us 2 business days to thoroughly review all your results before contacting the office for clarification. Should we see a critical lab result, you will be contacted sooner.   If You Need Anything After Your Visit  If you have any questions or concerns for your doctor, please call our main line at [phone] and press option 4 to reach your doctor's medical assistant. If no one answers, please leave a voicemail as directed and we will return your call as soon as possible. Messages left after 4 pm will be answered the following business day.   You may also send us a message via MyChart. We typically respond to MyChart messages within 1-2 business days.  For prescription refills, please ask your pharmacy to contact our office. Our fax number is [phone].  If you have an urgent issue when the clinic is closed that cannot wait until the next business day, you can page your doctor at the number below.    Please note that while we do our best to be available for urgent issues outside of office hours, we are not available 24/7.   If you have an urgent issue and are unable to reach us, you may choose to seek medical care at your doctor's office, retail clinic, urgent care center, or emergency room.  If you have a medical emergency, please immediately call 911 or go to the emergency department.  Pager Numbers  - Dr. Kowalski: [phone]  - Dr. Moye: [phone]  - Dr. Stewart:  [phone]  In the event of inclement weather, please call our main line at [phone] for an update on the status of any delays or closures.  Dermatology Medication Tips: Please keep the boxes that topical medications come in in order to help keep track of the instructions about where and how to use these. Pharmacies typically print the medication instructions only on the boxes and not directly on the medication tubes.   If your medication is too expensive, please contact our office at [phone] option 4 or send us a message through MyChart.   We are unable to tell what your co-pay for medications will be in advance as this is different depending on your insurance coverage. However, we may be able to find a substitute medication at lower cost or fill out paperwork to get insurance to cover a needed medication.   If a prior authorization is required to get your medication covered by your insurance company, please allow us 1-2 business days to complete this process.  Drug prices often vary depending on where the prescription is filled and some pharmacies may offer cheaper prices.  The website www.goodrx.com contains coupons for medications through different pharmacies. The prices here do not account for what the cost may be with help from insurance (it may be cheaper with your insurance), but the website can give you the price if you did not use any insurance.  - You can print the associated coupon and take it with   your prescription to the pharmacy.  - You may also stop by our office during regular business hours and pick up a GoodRx coupon card.  - If you need your prescription sent electronically to a different pharmacy, notify our office through [facility] MyChart or by phone at [phone] option 4.     Si Usted Necesita Algo Despus de Su Visita  Tambin puede enviarnos un mensaje a travs de MyChart. Por lo general respondemos a los mensajes de MyChart en el transcurso de 1 a 2  das hbiles.  Para renovar recetas, por favor pida a su farmacia que se ponga en contacto con nuestra oficina. Nuestro nmero de fax es el [phone].  Si tiene un asunto urgente cuando la clnica est cerrada y que no puede esperar hasta el siguiente da hbil, puede llamar/localizar a su doctor(a) al nmero que aparece a continuacin.   Por favor, tenga en cuenta que aunque hacemos todo lo posible para estar disponibles para asuntos urgentes fuera del horario de oficina, no estamos disponibles las 24 horas del da, los 7 das de la semana.   Si tiene un problema urgente y no puede comunicarse con nosotros, puede optar por buscar atencin mdica  en el consultorio de su doctor(a), en una clnica privada, en un centro de atencin urgente o en una sala de emergencias.  Si tiene una emergencia mdica, por favor llame inmediatamente al 911 o vaya a la sala de emergencias.  Nmeros de bper  - Dr. Kowalski: [phone]  - Dra. Moye: [phone]  - Dra. Stewart: [phone]  En caso de inclemencias del tiempo, por favor llame a nuestra lnea principal al [phone] para una actualizacin sobre el estado de cualquier retraso o cierre.  Consejos para la medicacin en dermatologa: Por favor, guarde las cajas en las que vienen los medicamentos de uso tpico para ayudarle a seguir las instrucciones sobre dnde y cmo usarlos. Las farmacias generalmente imprimen las instrucciones del medicamento slo en las cajas y no directamente en los tubos del medicamento.   Si su medicamento es muy caro, por favor, pngase en contacto con nuestra oficina llamando al [phone] y presione la opcin 4 o envenos un mensaje a travs de MyChart.   No podemos decirle cul ser su copago por los medicamentos por adelantado ya que esto es diferente dependiendo de la cobertura de su seguro. Sin embargo, es posible que podamos encontrar un medicamento sustituto a menor costo o llenar un formulario para que el  seguro cubra el medicamento que se considera necesario.   Si se requiere una autorizacin previa para que su compaa de seguros cubra su medicamento, por favor permtanos de 1 a 2 das hbiles para completar este proceso.  Los precios de los medicamentos varan con frecuencia dependiendo del lugar de dnde se surte la receta y alguna farmacias pueden ofrecer precios ms baratos.  El sitio web www.goodrx.com tiene cupones para medicamentos de diferentes farmacias. Los precios aqu no tienen en cuenta lo que podra costar con la ayuda del seguro (puede ser ms barato con su seguro), pero el sitio web puede darle el precio si no utiliz ningn seguro.  - Puede imprimir el cupn correspondiente y llevarlo con su receta a la farmacia.  - Tambin puede pasar por nuestra oficina durante el horario de atencin regular y recoger una tarjeta de cupones de GoodRx.  - Si necesita que su receta se enve electrnicamente a una farmacia diferente, informe a nuestra oficina a travs de MyChart de [place]   o por telfono llamando al [phone] y presione la opcin 4.  

## 2022-12-27 NOTE — ED Provider Notes (Signed)
[facility] Provider Note    Event Date/Time   First MD Initiated Contact with Patient 12/27/22 2054     (approximate)   History   Chest Pain   HPI  Maria [name] is a 32 y.o. female   Past medical history of alcoholic pancreatitis, GERD, asthma, migraine headaches who presents to the emergency department with multiple complaints, chiefly that her blood pressure is high.  Her dermatologist told her that the blood pressure is high from a office visit today.   She also has migraine headache associated with nausea.  History of the same, same quality.  She also has multiple other complaints including left-sided intermittent chest pain nonradiating nonexertional not associated with shortness of breath cough or fever.  She has also had some left-sided ear pain.  Also has some right sided lower molar pain for weeks.    External Medical Documents Reviewed: Dermatology visit dated earlier this afternoon when she was seen for atopic dermatitis follow-up, during this visit she states that she was hypertensive and was concerned and so called her PCP who had noted 140/80, 160/100, 140/100 and advised her to come to the emerged department.      Physical Exam   Triage Vital Signs: ED Triage Vitals  Enc Vitals Group     BP 12/27/22 1701 (!) 147/92     Pulse Rate 12/27/22 1701 83     Resp 12/27/22 1701 17     Temp 12/27/22 1701 98.8 F (37.1 C)     Temp Source 12/27/22 1701 Oral     SpO2 12/27/22 1701 100 %     Weight --      Height --      Head Circumference --      Peak Flow --      Pain Score 12/27/22 1702 8     Pain Loc --      Pain Edu? --      Excl. in GC? --     Most recent vital signs: Vitals:   12/27/22 1701 12/27/22 2141  BP: (!) 147/92 126/74  Pulse: 83 70  Resp: 17 20  Temp: 98.8 F (37.1 C)   SpO2: 100% 98%    General: Awake, no distress.  CV:  Good peripheral perfusion.  Resp:  Normal effort.  Abd:  No distention.   Other:  Covering her eyes because of photosensitivity related to her migraine.  Neck supple with full range of motion awake alert oriented with normal vital signs afebrile.  Her left TM appears normal.  Dentition appears normal no obvious abscess.  No trismus.  Phonation is normal.  Maintaining secretions.  Lungs clear abdomen soft and nontender skin appears warm well-perfused.  Motor or sensory exam normal.  No facial asymmetry and no temporal tenderness   ED Results / Procedures / Treatments   Labs (all labs ordered are listed, but only abnormal results are displayed) Labs Reviewed  BASIC METABOLIC PANEL - Abnormal; Notable for the following components:      Result Value   CO2 21 (*)    Glucose, Bld 107 (*)    All other components within normal limits  URINALYSIS, ROUTINE W REFLEX MICROSCOPIC - Abnormal; Notable for the following components:   Color, Urine YELLOW (*)    APPearance CLEAR (*)    All other components within normal limits  CBC  LIPASE, BLOOD  HEPATIC FUNCTION PANEL  POC URINE PREG, ED  TROPONIN I (HIGH SENSITIVITY)  TROPONIN I (HIGH  SENSITIVITY)     I ordered and reviewed the above labs they are notable for negative pregnancy test.  EKG  ED ECG REPORT I, Pilar Jarvis, the attending physician, personally viewed and interpreted this ECG.   Date: 12/27/2022  EKG Time: 1703  Rate: 76  Rhythm: NSR  Axis: nl  Intervals:none  ST&T Change: no stemi     PROCEDURES:  Critical Care performed: No  Procedures   MEDICATIONS ORDERED IN ED: Medications  sodium chloride 0.9 % bolus 1,000 mL (1,000 mLs Intravenous New Bag/Given 12/27/22 2129)  acetaminophen (TYLENOL) tablet 1,000 mg (1,000 mg Oral Given 12/27/22 2130)  metoCLOPramide (REGLAN) injection 10 mg (10 mg Intravenous Given 12/27/22 2132)  diphenhydrAMINE (BENADRYL) injection 25 mg (25 mg Intravenous Given 12/27/22 2130)  ketorolac (TORADOL) 15 MG/ML injection 15 mg (15 mg Intravenous Given 12/27/22 2139)      IMPRESSION / MDM / ASSESSMENT AND PLAN / ED COURSE  I reviewed the triage vital signs and the nursing notes.                                Patient's presentation is most consistent with acute presentation with potential threat to life or bodily function.  Differential diagnosis includes, but is not limited to, hypertension, migraine headache, ACS, PE, dissection, musculoskeletal pain, pancreatitis   The patient is on the cardiac monitor to evaluate for evidence of arrhythmia and/or significant heart rate changes.  MDM:    Is a patient with multiple complaints but primarily here because of high blood pressure reading from her dermatologist office.  Not markedly high reportedly and she in fact is normotensive here.  For her other complaints, her migraine headache with nausea will be treated with migraine cocktail.  No focal neurologic deficits or trauma to suggest more sinister intracranial pathologies like intracranial bleeding or stroke.  For her dental pain there is no obvious abscess or oropharyngeal emergencies so she will follow-up with her dentist.  For her ear pain there is no signs of infection she looks well no signs of mastoiditis so she will follow-up with her primary doctor.  For her chest pain, would be very atypical for PE, dissection, ACS in this otherwise healthy 32 year old with few risk factors for the above.  EKG is nonischemic, initial troponin is negative.  I do not know what is causing her intermittent chest pain but highly doubt emergent pathologies.  Check chest x-ray for infection, pneumothorax.  Workup above is unremarkable, she can follow-up as an outpatient for her subacute/chronic issues including dental pain and ear pain and BP checks       FINAL CLINICAL IMPRESSION(S) / ED DIAGNOSES   Final diagnoses:  Other migraine without status migrainosus, not intractable  Left ear pain  Pain, dental  Nonspecific chest pain     Rx / DC Orders    ED Discharge Orders     None        Note:  This document was prepared using Dragon voice recognition software and may include unintentional dictation errors.    Pilar Jarvis, MD 12/27/22 2206

## 2022-12-27 NOTE — Discharge Instructions (Signed)
Take acetaminophen 650 mg and ibuprofen 400 mg every 6 hours for pain.  Take with food.   Please follow-up with your doctor to recheck your on your ear pain, chest pain and blood pressure.  Call your dentist to check on your dental pain  If you experience any new, worsening, or unexpected symptoms then call your doctor right away or come back to the emergency department for recheck.

## 2022-12-27 NOTE — Telephone Encounter (Signed)
  Chief Complaint: elevated BP Symptoms: headache, nausea, elevated BP reading at dermatology office Frequency: today- may have been going on longer as patient reports she has these symptoms frequently  Disposition: [x] ED /[] Urgent Care (no appt availability in office) / [] Appointment(In office/virtual)/ []  [facility]/ [] Home Care/ [] Refused Recommended Disposition /[] [facility]/ []  Follow-up with PCP Additional Notes: Patient agrees to go to ED- I will notify office patient would like to schedule NP appointment ([facility]- they schedule)

## 2022-12-27 NOTE — Progress Notes (Signed)
   Follow-Up Visit   Subjective  Maria [name] is a 32 y.o. female who presents for the following: 2 weeks f/u on atopic dermatitis patient was prescribed Adbry injections 2 weeks ago which she think has caused extreme itching and a rash on her scalp, face and arms, she does not want to continue Abdry  injections. Patient taking Benadryl at bedtime  and Zyrtec and Singulair during the day.  Patient c/o sinus issues and tooth issues   The following portions of the chart were reviewed this encounter and updated as appropriate: medications, allergies, medical history  Review of Systems:  No other skin or systemic complaints except as noted in HPI or Assessment and Plan.  Objective  Well appearing patient in no apparent distress; mood and affect are within normal limits. A focused examination was performed of the following areas:face,arms, scalp  Relevant exam findings are noted in the Assessment and Plan.  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 It is often associated with allergic rhinitis and/or asthma and can require treatment with topical medications, phototherapy, or in severe cases biologic injectable medication (Dupixent; Adbry) or Oral JAK inhibitors.   Pt decided not to continue Adbry today. Discussed starting Rivoq or CIBINQOT patient declines these today.  Continue oral antihistamines if helpful.  Patient instructed to call and schedule an appt with her PCP to discuss elevated BP  Patient instructed to call here in 2 weeks after she see her dentist and her sinus is under control.  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  Condition is symptomatic / bothersome to patient. Not to goal. but patient feels that it is improved on current treatment.   continue clindamycin solution twice a day to bumps at scalp.  Continue ketoconazole shampoo alternating with head and shoulders shampoo Start  Doxycycline 20mg  po BID  Pt has declined Isotretinoin in past and again today.  Sinus and Tooth Issues - Likely contributing to flares of above conditions Keep scheduled dentist appt next week.  Recommend referral to ENT due to sinus and ear issues  Patient instructed to call here in 2 weeks after she see her dentist and her sinus is under control.  Dr Roseanne Reno examined patient in the office today   Pruritus and other somatic complaints Pt agrees these are exacerbated by stress associated with caring for her special needs son.   Return if symptoms worsen or fail to improve.  IAngelique Holm, CMA, am acting as scribe for Armida Sans, MD .   Documentation: I have reviewed the above documentation for accuracy and completeness, and I agree with the above.  Armida Sans, MD

## 2022-12-27 NOTE — ED Triage Notes (Signed)
Pt presents to ED with c/o of CP and headache since yesterday. Pt states she is also nauseous. Pt states she was sent by her derm office for further eval. Pt denies cardiac HX.

## 2023-01-02 ENCOUNTER — Encounter: Payer: Self-pay | Admitting: Dermatology

## 2023-01-17 NOTE — Progress Notes (Unsigned)
There were no vitals taken for this visit.   Subjective:    Patient ID: Maria [name], female    DOB: 09/01/1990, 32 y.o.   MRN: [id number]  HPI: Maria [name] is a 32 y.o. female  No chief complaint on file.  Establish care: her last physical was ***.  Medical history includes ***.  Family history includes ***.  Health maintenance ***.   Relevant past medical, surgical, family and social history reviewed and updated as indicated. Interim medical history since our last visit reviewed. Allergies and medications reviewed and updated.  Review of Systems  Constitutional: Negative for fever or weight change.  Respiratory: Negative for cough and shortness of breath.   Cardiovascular: Negative for chest pain or palpitations.  Gastrointestinal: Negative for abdominal pain, no bowel changes.  Musculoskeletal: Negative for gait problem or joint swelling.  Skin: Negative for rash.  Neurological: Negative for dizziness or headache.  No other specific complaints in a complete review of systems (except as listed in HPI above).      Objective:    There were no vitals taken for this visit.  Wt Readings from Last 3 Encounters:  11/06/22 158 lb (71.7 kg)  04/20/22 158 lb 4 oz (71.8 kg)  01/27/22 149 lb 14.6 oz (68 kg)    Physical Exam  Constitutional: Patient appears well-developed and well-nourished. Obese *** No distress.  HEENT: head atraumatic, normocephalic, pupils equal and reactive to light, ears ***, neck supple, throat within normal limits Cardiovascular: Normal rate, regular rhythm and normal heart sounds.  No murmur heard. No BLE edema. Pulmonary/Chest: Effort normal and breath sounds normal. No respiratory distress. Abdominal: Soft.  There is no tenderness. Psychiatric: Patient has a normal mood and affect. behavior is normal. Judgment and thought content normal.  Results for orders placed or performed during the hospital encounter of 12/27/22  Basic metabolic  panel  Result Value Ref Range   Sodium 138 135 - 145 mmol/L   Potassium 4.0 3.5 - 5.1 mmol/L   Chloride 105 98 - 111 mmol/L   CO2 21 (L) 22 - 32 mmol/L   Glucose, Bld 107 (H) 70 - 99 mg/dL   BUN 9 6 - 20 mg/dL   Creatinine, Ser 4.09 0.44 - 1.00 mg/dL   Calcium 9.2 8.9 - 81.1 mg/dL   GFR, Estimated >91 >47 mL/min   Anion gap 12 5 - 15  CBC  Result Value Ref Range   WBC 7.0 4.0 - 10.5 K/uL   RBC 4.72 3.87 - 5.11 MIL/uL   Hemoglobin 14.4 12.0 - 15.0 g/dL   HCT 82.9 56.2 - 13.0 %   MCV 92.4 80.0 - 100.0 fL   MCH 30.5 26.0 - 34.0 pg   MCHC 33.0 30.0 - 36.0 g/dL   RDW 86.5 78.4 - 69.6 %   Platelets 313 150 - 400 K/uL   nRBC 0.0 0.0 - 0.2 %  Urinalysis, Routine w reflex microscopic -Urine, Clean Catch  Result Value Ref Range   Color, Urine YELLOW (A) YELLOW   APPearance CLEAR (A) CLEAR   Specific Gravity, Urine 1.015 1.005 - 1.030   pH 5.0 5.0 - 8.0   Glucose, UA NEGATIVE NEGATIVE mg/dL   Hgb urine dipstick NEGATIVE NEGATIVE   Bilirubin Urine NEGATIVE NEGATIVE   Ketones, ur NEGATIVE NEGATIVE mg/dL   Protein, ur NEGATIVE NEGATIVE mg/dL   Nitrite NEGATIVE NEGATIVE   Leukocytes,Ua NEGATIVE NEGATIVE  Lipase, blood  Result Value Ref Range   Lipase 32 11 -  51 U/L  Hepatic function panel  Result Value Ref Range   Total Protein 7.4 6.5 - 8.1 g/dL   Albumin 4.6 3.5 - 5.0 g/dL   AST 29 15 - 41 U/L   ALT 37 0 - 44 U/L   Alkaline Phosphatase 58 38 - 126 U/L   Total Bilirubin 0.5 0.3 - 1.2 mg/dL   Bilirubin, Direct 0.1 0.0 - 0.2 mg/dL   Indirect Bilirubin 0.4 0.3 - 0.9 mg/dL  POC urine preg, ED  Result Value Ref Range   Preg Test, Ur Negative Negative  Troponin I (High Sensitivity)  Result Value Ref Range   Troponin I (High Sensitivity) <2 <18 ng/L  Troponin I (High Sensitivity)  Result Value Ref Range   Troponin I (High Sensitivity) <2 <18 ng/L      Assessment & Plan:   Problem List Items Addressed This Visit   None    Follow up plan: No follow-ups on  file.

## 2023-01-18 ENCOUNTER — Encounter: Payer: Self-pay | Admitting: Nurse Practitioner

## 2023-01-18 ENCOUNTER — Ambulatory Visit (INDEPENDENT_AMBULATORY_CARE_PROVIDER_SITE_OTHER): Payer: Medicaid Other | Admitting: Nurse Practitioner

## 2023-01-18 ENCOUNTER — Other Ambulatory Visit: Payer: Self-pay

## 2023-01-18 VITALS — BP 120/72 | HR 98 | Temp 97.9°F | Resp 16 | Ht 62.0 in | Wt 159.8 lb

## 2023-01-18 DIAGNOSIS — Z7689 Persons encountering health services in other specified circumstances: Secondary | ICD-10-CM

## 2023-01-18 DIAGNOSIS — F411 Generalized anxiety disorder: Secondary | ICD-10-CM

## 2023-01-18 DIAGNOSIS — Z9109 Other allergy status, other than to drugs and biological substances: Secondary | ICD-10-CM

## 2023-01-18 DIAGNOSIS — Z130 Encounter for screening for diseases of the blood and blood-forming organs and certain disorders involving the immune mechanism: Secondary | ICD-10-CM

## 2023-01-18 DIAGNOSIS — Z114 Encounter for screening for human immunodeficiency virus [HIV]: Secondary | ICD-10-CM | POA: Diagnosis not present

## 2023-01-18 DIAGNOSIS — Z1159 Encounter for screening for other viral diseases: Secondary | ICD-10-CM

## 2023-01-18 DIAGNOSIS — L2081 Atopic neurodermatitis: Secondary | ICD-10-CM

## 2023-01-18 DIAGNOSIS — R300 Dysuria: Secondary | ICD-10-CM | POA: Diagnosis not present

## 2023-01-18 DIAGNOSIS — K5909 Other constipation: Secondary | ICD-10-CM

## 2023-01-18 DIAGNOSIS — Z131 Encounter for screening for diabetes mellitus: Secondary | ICD-10-CM

## 2023-01-18 DIAGNOSIS — G43009 Migraine without aura, not intractable, without status migrainosus: Secondary | ICD-10-CM

## 2023-01-18 DIAGNOSIS — R030 Elevated blood-pressure reading, without diagnosis of hypertension: Secondary | ICD-10-CM

## 2023-01-18 DIAGNOSIS — Z13220 Encounter for screening for lipoid disorders: Secondary | ICD-10-CM

## 2023-01-18 DIAGNOSIS — Z8659 Personal history of other mental and behavioral disorders: Secondary | ICD-10-CM

## 2023-01-18 LAB — POCT URINALYSIS DIPSTICK
Bilirubin, UA: NEGATIVE
Glucose, UA: NEGATIVE
Ketones, UA: NEGATIVE
Nitrite, UA: POSITIVE
Protein, UA: POSITIVE — AB
Spec Grav, UA: 1.02 (ref 1.010–1.025)
Urobilinogen, UA: 0.2 E.U./dL
pH, UA: 5 (ref 5.0–8.0)

## 2023-01-18 LAB — CBC WITH DIFFERENTIAL/PLATELET
Basophils Absolute: 22 cells/uL (ref 0–200)
Eosinophils Relative: 1.1 %
MCH: 30.8 pg (ref 27.0–33.0)
Monocytes Relative: 6.8 %
Neutrophils Relative %: 50.5 %
Platelets: 270 10*3/uL (ref 140–400)
RDW: 13 % (ref 11.0–15.0)

## 2023-01-18 MED ORDER — SULFAMETHOXAZOLE-TRIMETHOPRIM 800-160 MG PO TABS
1.0000 | ORAL_TABLET | Freq: Two times a day (BID) | ORAL | 0 refills | Status: AC
Start: 2023-01-18 — End: 2023-01-23

## 2023-01-18 NOTE — Assessment & Plan Note (Signed)
Established with dermatology. she currently takes singuliar and cetirizine daily.  Adbry 150mg /mL injected SQ into L upper arm x 4 for a total of 600mg /mL. She is also using clindamycin solution, ketoconazole shampoo, doxycycline 20 mg BID.

## 2023-01-18 NOTE — Assessment & Plan Note (Signed)
Currently on linzess, established with gi

## 2023-01-18 NOTE — Assessment & Plan Note (Signed)
Referral to psychiatry was placed by neurology

## 2023-01-18 NOTE — Assessment & Plan Note (Signed)
Being seen by neurology,  not currently on medications, referral was placed to psychiatry for eval of ADHD

## 2023-01-18 NOTE — Assessment & Plan Note (Signed)
Established with dermatology. she currently takes singuliar and cetirizine daily.  Adbry 150mg/mL injected SQ into L upper arm x 4 for a total of 600mg/mL. She is also using clindamycin solution, ketoconazole shampoo, doxycycline 20 mg BID.  

## 2023-01-19 LAB — COMPLETE METABOLIC PANEL WITH GFR
AG Ratio: 2.4 (calc) (ref 1.0–2.5)
ALT: 24 U/L (ref 6–29)
AST: 19 U/L (ref 10–30)
Albumin: 5 g/dL (ref 3.6–5.1)
Alkaline phosphatase (APISO): 54 U/L (ref 31–125)
BUN/Creatinine Ratio: 9 (calc) (ref 6–22)
BUN: 9 mg/dL (ref 7–25)
CO2: 23 mmol/L (ref 20–32)
Calcium: 9.3 mg/dL (ref 8.6–10.2)
Chloride: 106 mmol/L (ref 98–110)
Creat: 1.04 mg/dL — ABNORMAL HIGH (ref 0.50–0.97)
Globulin: 2.1 g/dL (calc) (ref 1.9–3.7)
Glucose, Bld: 89 mg/dL (ref 65–99)
Potassium: 4 mmol/L (ref 3.5–5.3)
Sodium: 139 mmol/L (ref 135–146)
Total Bilirubin: 0.5 mg/dL (ref 0.2–1.2)
Total Protein: 7.1 g/dL (ref 6.1–8.1)
eGFR: 73 mL/min/{1.73_m2} (ref >=60)

## 2023-01-19 LAB — CBC WITH DIFFERENTIAL/PLATELET
Absolute Monocytes: 374 cells/uL (ref 200–950)
Basophils Relative: 0.4 %
Eosinophils Absolute: 61 cells/uL (ref 15–500)
HCT: 41.5 % (ref 35.0–45.0)
Hemoglobin: 14.2 g/dL (ref 11.7–15.5)
Lymphs Abs: 2266 cells/uL (ref 850–3900)
MCHC: 34.2 g/dL (ref 32.0–36.0)
MCV: 90 fL (ref 80.0–100.0)
MPV: 10.4 fL (ref 7.5–12.5)
Neutro Abs: 2778 cells/uL (ref 1500–7800)
RBC: 4.61 10*6/uL (ref 3.80–5.10)
Total Lymphocyte: 41.2 %
WBC: 5.5 10*3/uL (ref 3.8–10.8)

## 2023-01-19 LAB — LIPID PANEL
Cholesterol: 176 mg/dL (ref <200)
HDL: 55 mg/dL (ref >=50)
LDL Cholesterol (Calc): 104 mg/dL (calc) — ABNORMAL HIGH
Non-HDL Cholesterol (Calc): 121 mg/dL (calc) (ref <130)
Total CHOL/HDL Ratio: 3.2 (calc) (ref <5.0)
Triglycerides: 81 mg/dL (ref <150)

## 2023-01-19 LAB — HEPATITIS C ANTIBODY: Hepatitis C Ab: NONREACTIVE

## 2023-01-19 LAB — URINE CULTURE
MICRO NUMBER:: 15021279
Result:: NO GROWTH
SPECIMEN QUALITY:: ADEQUATE

## 2023-01-19 LAB — HEMOGLOBIN A1C
Hgb A1c MFr Bld: 5.3 % of total Hgb (ref <5.7)
Mean Plasma Glucose: 105 mg/dL
eAG (mmol/L): 5.8 mmol/L

## 2023-01-19 LAB — HIV ANTIBODY (ROUTINE TESTING W REFLEX): HIV 1&2 Ab, 4th Generation: NONREACTIVE

## 2023-01-24 ENCOUNTER — Other Ambulatory Visit: Payer: Self-pay | Admitting: Emergency Medicine

## 2023-01-24 ENCOUNTER — Ambulatory Visit: Payer: Self-pay

## 2023-01-24 ENCOUNTER — Other Ambulatory Visit (HOSPITAL_COMMUNITY)
Admission: RE | Admit: 2023-01-24 | Discharge: 2023-01-24 | Disposition: A | Discharge status: Home / Self Care | Payer: Medicaid Other | Source: Ambulatory Visit | Attending: Nurse Practitioner | Admitting: Nurse Practitioner

## 2023-01-24 DIAGNOSIS — B379 Candidiasis, unspecified: Secondary | ICD-10-CM

## 2023-01-24 NOTE — Telephone Encounter (Signed)
Patient stated she was still having burning when urinating but was having a reaction to the medication. She stopped the medication and started it over again, same reaction. Had a rash on her feet and itching.   Per [name] patient is to come by office for vaginal swab

## 2023-01-24 NOTE — Telephone Encounter (Signed)
Message from [employer] sent at 01/24/2023  7:57 AM EDT  Summary: skin irritation / rx concern   The patient would like to speak with a member of staff regarding their prescription for sulfamethoxazole-trimethoprim (BACTRIM DS) 800-160 MG tablet [[id number]]  The patient shares that they are concerned that the medication may be causing recent skin irritation on their feet  The patient would like to speak with a member of clinical staff when possible         Chief Complaint: med side effects Symptoms: has redness and itching  Frequency: 2 day ago -unsure of days late Sunday night Monday am   Pertinent Negatives: Patient denies any redness or itching now Disposition: [] ED /[] Urgent Care (no appt availability in office) / [] Appointment(In office/virtual)/ []  [facility]/ [x] Home Care/ [] Refused Recommended Disposition /[] [facility]/ []  Follow-up with PCP Additional Notes: stopped med last dose  Sunday  Still burning with urination  Reason for Disposition  [1] Caller has URGENT medicine question about med that PCP or specialist prescribed AND [2] triager unable to answer question  Answer Assessment - Initial Assessment Questions 1. NAME of MEDICINE: "What medicine(s) are you calling about?"     Bactrim  DS 2. QUESTION: "What is your question?" (e.g., double dose of medicine, side effect)     Side effects 3. PRESCRIBER: "Who prescribed the medicine?" Reason: if prescribed by specialist, call should be referred to that group.     *No Answer* 4. SYMPTOMS: "Do you have any symptoms?" If Yes, ask: "What symptoms are you having?"  "How bad are the symptoms (e.g., mild, moderate, severe)     Feet itching and red worse  stopped taking the itching red ness stopped  5. PREGNANCY:  "Is there any chance that you are pregnant?" "When was your last menstrual period?"     N/a  Protocols used: Medication Question Call-A-AH

## 2023-01-26 ENCOUNTER — Telehealth: Payer: Self-pay

## 2023-01-26 LAB — CERVICOVAGINAL ANCILLARY ONLY
Bacterial Vaginitis (gardnerella): NEGATIVE
Candida Glabrata: NEGATIVE
Candida Vaginitis: NEGATIVE
Chlamydia: NEGATIVE
Comment: NEGATIVE
Comment: NEGATIVE
Comment: NEGATIVE
Comment: NEGATIVE
Comment: NEGATIVE
Comment: NORMAL
Neisseria Gonorrhea: NEGATIVE
Trichomonas: NEGATIVE

## 2023-01-26 NOTE — Telephone Encounter (Signed)
Pt given lab results per notes of [name], NP on 01/26/23. Pt verbalized understanding.    Vaginal swab is negative  Written by Berniece Salines, FNP on 01/26/2023  2:51 PM EDT

## 2023-02-05 ENCOUNTER — Ambulatory Visit: Payer: Medicaid Other | Admitting: Family Medicine

## 2023-02-07 ENCOUNTER — Ambulatory Visit (INDEPENDENT_AMBULATORY_CARE_PROVIDER_SITE_OTHER): Payer: Medicaid Other | Admitting: Dermatology

## 2023-02-07 DIAGNOSIS — T370X5A Adverse effect of sulfonamides, initial encounter: Secondary | ICD-10-CM | POA: Diagnosis not present

## 2023-02-07 DIAGNOSIS — R21 Rash and other nonspecific skin eruption: Secondary | ICD-10-CM

## 2023-02-07 DIAGNOSIS — L814 Other melanin hyperpigmentation: Secondary | ICD-10-CM | POA: Diagnosis not present

## 2023-02-07 DIAGNOSIS — T50905A Adverse effect of unspecified drugs, medicaments and biological substances, initial encounter: Secondary | ICD-10-CM

## 2023-02-07 NOTE — Patient Instructions (Signed)
Due to recent changes in healthcare laws, you may see results of your pathology and/or laboratory studies on MyChart before the doctors have had a chance to review them. We understand that in some cases there may be results that are confusing or concerning to you. Please understand that not all results are received at the same time and often the doctors may need to interpret multiple results in order to provide you with the best plan of care or course of treatment. Therefore, we ask that you please give us 2 business days to thoroughly review all your results before contacting the office for clarification. Should we see a critical lab result, you will be contacted sooner.   If You Need Anything After Your Visit  If you have any questions or concerns for your doctor, please call our main line at [phone] and press option 4 to reach your doctor's medical assistant. If no one answers, please leave a voicemail as directed and we will return your call as soon as possible. Messages left after 4 pm will be answered the following business day.   You may also send us a message via MyChart. We typically respond to MyChart messages within 1-2 business days.  For prescription refills, please ask your pharmacy to contact our office. Our fax number is [phone].  If you have an urgent issue when the clinic is closed that cannot wait until the next business day, you can page your doctor at the number below.    Please note that while we do our best to be available for urgent issues outside of office hours, we are not available 24/7.   If you have an urgent issue and are unable to reach us, you may choose to seek medical care at your doctor's office, retail clinic, urgent care center, or emergency room.  If you have a medical emergency, please immediately call 911 or go to the emergency department.  Pager Numbers  - Dr. Kowalski: [phone]  - Dr. Moye: [phone]  - Dr. Stewart:  [phone]  In the event of inclement weather, please call our main line at [phone] for an update on the status of any delays or closures.  Dermatology Medication Tips: Please keep the boxes that topical medications come in in order to help keep track of the instructions about where and how to use these. Pharmacies typically print the medication instructions only on the boxes and not directly on the medication tubes.   If your medication is too expensive, please contact our office at [phone] option 4 or send us a message through MyChart.   We are unable to tell what your co-pay for medications will be in advance as this is different depending on your insurance coverage. However, we may be able to find a substitute medication at lower cost or fill out paperwork to get insurance to cover a needed medication.   If a prior authorization is required to get your medication covered by your insurance company, please allow us 1-2 business days to complete this process.  Drug prices often vary depending on where the prescription is filled and some pharmacies may offer cheaper prices.  The website www.goodrx.com contains coupons for medications through different pharmacies. The prices here do not account for what the cost may be with help from insurance (it may be cheaper with your insurance), but the website can give you the price if you did not use any insurance.  - You can print the associated coupon and take it with   your prescription to the pharmacy.  - You may also stop by our office during regular business hours and pick up a GoodRx coupon card.  - If you need your prescription sent electronically to a different pharmacy, notify our office through [facility] MyChart or by phone at [phone] option 4.     Si Usted Necesita Algo Despus de Su Visita  Tambin puede enviarnos un mensaje a travs de MyChart. Por lo general respondemos a los mensajes de MyChart en el transcurso de 1 a 2  das hbiles.  Para renovar recetas, por favor pida a su farmacia que se ponga en contacto con nuestra oficina. Nuestro nmero de fax es el [phone].  Si tiene un asunto urgente cuando la clnica est cerrada y que no puede esperar hasta el siguiente da hbil, puede llamar/localizar a su doctor(a) al nmero que aparece a continuacin.   Por favor, tenga en cuenta que aunque hacemos todo lo posible para estar disponibles para asuntos urgentes fuera del horario de oficina, no estamos disponibles las 24 horas del da, los 7 das de la semana.   Si tiene un problema urgente y no puede comunicarse con nosotros, puede optar por buscar atencin mdica  en el consultorio de su doctor(a), en una clnica privada, en un centro de atencin urgente o en una sala de emergencias.  Si tiene una emergencia mdica, por favor llame inmediatamente al 911 o vaya a la sala de emergencias.  Nmeros de bper  - Dr. Kowalski: [phone]  - Dra. Moye: [phone]  - Dra. Stewart: [phone]  En caso de inclemencias del tiempo, por favor llame a nuestra lnea principal al [phone] para una actualizacin sobre el estado de cualquier retraso o cierre.  Consejos para la medicacin en dermatologa: Por favor, guarde las cajas en las que vienen los medicamentos de uso tpico para ayudarle a seguir las instrucciones sobre dnde y cmo usarlos. Las farmacias generalmente imprimen las instrucciones del medicamento slo en las cajas y no directamente en los tubos del medicamento.   Si su medicamento es muy caro, por favor, pngase en contacto con nuestra oficina llamando al [phone] y presione la opcin 4 o envenos un mensaje a travs de MyChart.   No podemos decirle cul ser su copago por los medicamentos por adelantado ya que esto es diferente dependiendo de la cobertura de su seguro. Sin embargo, es posible que podamos encontrar un medicamento sustituto a menor costo o llenar un formulario para que el  seguro cubra el medicamento que se considera necesario.   Si se requiere una autorizacin previa para que su compaa de seguros cubra su medicamento, por favor permtanos de 1 a 2 das hbiles para completar este proceso.  Los precios de los medicamentos varan con frecuencia dependiendo del lugar de dnde se surte la receta y alguna farmacias pueden ofrecer precios ms baratos.  El sitio web www.goodrx.com tiene cupones para medicamentos de diferentes farmacias. Los precios aqu no tienen en cuenta lo que podra costar con la ayuda del seguro (puede ser ms barato con su seguro), pero el sitio web puede darle el precio si no utiliz ningn seguro.  - Puede imprimir el cupn correspondiente y llevarlo con su receta a la farmacia.  - Tambin puede pasar por nuestra oficina durante el horario de atencin regular y recoger una tarjeta de cupones de GoodRx.  - Si necesita que su receta se enve electrnicamente a una farmacia diferente, informe a nuestra oficina a travs de MyChart de [place]   o por telfono llamando al [phone] y presione la opcin 4.  

## 2023-02-07 NOTE — Progress Notes (Signed)
   Follow-Up Visit   Subjective  Maria [name] is a 32 y.o. 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 She recently was put on Bactrim DS for UTI and as soon as she started taking it, her feet started itching and got red. When she stopped taking the medicine she said the itching and redness improved. She is concerned that it could be recurrent lichen planus vs reaction to antibiotic.   The following portions of the chart were reviewed this encounter and updated as appropriate: medications, allergies, medical history  Review of Systems:  No other skin or systemic complaints except as noted in HPI or Assessment and Plan.  Objective  Well appearing patient in no apparent distress; mood and affect are within normal limits.   A focused examination was performed of the following areas: Feet, arms, hands  Relevant exam findings are noted in the Assessment and Plan.    Assessment & Plan   Rash- probable drug reaction Exam: Mild erythema at plantar feet, no discreet inflammatory skin lesions  Differential diagnosis:  Allergic reaction to Bactrim DS which has resolved  Treatment Plan: Patient requests skin biopsy.  Patient advised biopsy not recommended to r/o Lichen Planus because there is no visible rash or skin lesions suspicious for LP and thus is not indicated. Most likely itching was a reaction to Bactrim DS. Added Bactrim to allergy list.  LENTIGINES Exam: scattered tan macules Due to sun exposure Treatment Plan: Benign-appearing, observe. Recommend daily broad spectrum sunscreen SPF 30+ to sun-exposed areas, reapply every 2 hours as needed.  Call for any changes    Return if symptoms worsen or fail to improve.  Anise Salvo, RMA, am acting as scribe for Willeen Niece, MD .   Documentation: I have reviewed the above documentation for accuracy and completeness,  and I agree with the above.  Willeen Niece, MD

## 2023-02-28 LAB — HM PAP SMEAR: HM Pap smear: ABNORMAL

## 2023-03-05 ENCOUNTER — Ambulatory Visit: Payer: Self-pay | Admitting: *Deleted

## 2023-03-05 ENCOUNTER — Other Ambulatory Visit: Payer: Self-pay | Admitting: Gastroenterology

## 2023-03-05 NOTE — Telephone Encounter (Signed)
  Chief Complaint: headache , nausea, N/T arms, hands and LE's. Symptoms: headache started today back and sides. Nausea reported and took medication. C/o neck pain with N/T hands arms and LE's. When HA started patient felt "hot" then nausea. Unknown BP reports last night Bp 145/74 then 130/78. Does not have monitor now to check. Frequency: today Pertinent Negatives: Patient denies chest pain no blurred vision no vomiting no issues with walking  Disposition: [] ED /[] Urgent Care (no appt availability in office) / [] Appointment(In office/virtual)/ []  [facility]/ [] Home Care/ [x] Refused Recommended Disposition /[] [facility]/ []  Follow-up with PCP Additional Notes: offered appt today with provider other than PCP due to none available with PCP today. Patient declined and wants BP checked now , she will go to UC instead.     Reason for Disposition  [1] MODERATE headache (e.g., interferes with normal activities) AND [2] present > 24 hours AND [3] unexplained  (Exceptions: analgesics not tried, typical migraine, or headache part of viral illness)  Answer Assessment - Initial Assessment Questions 1. LOCATION: "Where does it hurt?"      Back and sides  2. ONSET: "When did the headache start?" (Minutes, hours or days)     Today around 1 hour ago   3. PATTERN: "Does the pain come and go, or has it been constant since it started?"     Comes and goes gets nausea and extremely "hot"  4. SEVERITY: "How bad is the pain?" and "What does it keep you from doing?"  (e.g., Scale 1-10; mild, moderate, or severe)   - MILD (1-3): doesn't interfere with normal activities    - MODERATE (4-7): interferes with normal activities or awakens from sleep    - SEVERE (8-10): excruciating pain, unable to do any normal activities        Nausea worsening took medication . Headache left work due to headache and feeling "hot" 5. RECURRENT SYMPTOM: "Have you ever had headaches before?" If Yes, ask: "When was  the last time?" and "What happened that time?"      Yes  6. CAUSE: "What do you think is causing the headache?"     Not sure  7. MIGRAINE: "Have you been diagnosed with migraine headaches?" If Yes, ask: "Is this headache similar?"      na 8. HEAD INJURY: "Has there been any recent injury to the head?"      na 9. OTHER SYMPTOMS: "Do you have any other symptoms?" (fever, stiff neck, eye pain, sore throat, cold symptoms)     Tingling in arms hands fingers and lower extremities. Neck pain headache nausea  10. PREGNANCY: "Is there any chance you are pregnant?" "When was your last menstrual period?"       na  Protocols used: [facility]

## 2023-03-05 NOTE — Telephone Encounter (Signed)
Reason for Disposition  Nausea is a chronic symptom (recurrent or ongoing AND present > 4 weeks)  Answer Assessment - Initial Assessment Questions 1. NAUSEA SEVERITY: "How bad is the nausea?" (e.g., mild, moderate, severe; dehydration, weight loss)   - MILD: loss of appetite without change in eating habits   - MODERATE: decreased oral intake without significant weight loss, dehydration, or malnutrition   - SEVERE: inadequate caloric or fluid intake, significant weight loss, symptoms of dehydration     I returned her call.    I've been dealing with nausea for a very long time now.   I take my nausea medicine with me every where I go. My mother and I have noticed, I'm having trouble with my BP getting high.    When I have nausea I'll eat candy and the nausea will stop.    Do I need to have my sugar checked.    I don't know when the last time they checked my blood.    2. ONSET: "When did the nausea begin?"     A very long time. 3. VOMITING: "Any vomiting?" If Yes, ask: "How many times today?"     No 4. RECURRENT SYMPTOM: "Have you had nausea before?" If Yes, ask: "When was the last time?" "What happened that time?"     Yes for a very long time. 5. CAUSE: "What do you think is causing the nausea?"     My sugar, maybe.       I found my BP machine in the car.    6. PREGNANCY: "Is there any chance you are pregnant?" (e.g., unprotected intercourse, missed birth control pill, broken condom)     Not asked  Protocols used: Nausea-A-AH  Chief Complaint: Long term nausea without vomiting Symptoms: I have noticed when I'm having nausea and I eat chocolate or candy the nausea goes away.   Could this be my blood sugar causing me to have nausea?   Requesting blood work Frequency: "For a very long time I've had this nausea" Pertinent Negatives: Patient denies vomiting or diarrhea.  Just nausea Disposition: [] ED /[] Urgent Care (no appt availability in office) / [x] Appointment(In office/virtual)/ []  [facility]/ [] Home Care/ [] Refused Recommended Disposition /[] [facility]/ []  Follow-up with PCP Additional Notes: Appt made with Della Goo, FNP for 03/08/2023 at 9:20.    I instructed her to come fasting when she asked if she should fast for this appt or not.    I erred on the side of instructing her to come fasting.

## 2023-03-05 NOTE — Telephone Encounter (Signed)
FYI

## 2023-03-05 NOTE — Telephone Encounter (Signed)
Message from Turkey B sent at 03/05/2023 12:48 PM EDT  Summary: nausea   Pt called, in about if nausea and she is wondering if her blood sugar has something to do it.          Call History  Contact Date/Time Type Contact Phone/Fax User  03/05/2023 12:45 PM EDT Phone (Incoming) Maria [name] "[name]" (Self) [phone] Rexene Edison) Eliseo Gum, Deedra Ehrich

## 2023-03-05 NOTE — Telephone Encounter (Signed)
Attempted to return her call.   Left a voicemail to call back to discuss symptoms with a nurse.   (Was triaged earlier today, see prior notes)

## 2023-03-07 NOTE — Progress Notes (Signed)
BP 118/72   Pulse 98   Temp 98.1 F (36.7 C) (Oral)   Resp 16   Ht 5\' 2"  (1.575 m)   Wt 158 lb 12.8 oz (72 kg)   SpO2 99%   BMI 29.04 kg/m    Subjective:    Patient ID: Maria [name], female    DOB: July 26, 1991, 32 y.o.   MRN: [id number]  HPI: Maria [name] is a 32 y.o. female  Chief Complaint  Patient presents with   Nausea    Recurrent nausea   Neck Pain   Continuous nausea: patient has a long history of nausea. She says that she will sometimes have the nausea with a headache,  but not always. She is established with GI. Last seen on 04/20/2022. She reports she is currently taking zofran.  She reports that the zofran does help. This is not a new complaint. Discussed following up with GI. She is going to call and schedule appointment with Dr. Allegra Lai.    Neck pain: she reports she has had neck pain for about a month, getting progressively worse. She denies any injury.   She says she has tried ice packs.  She has not had any imaging done of her neck.  She reports she has been taking tylenol and ibuprofen.  Will get xray of neck.  Recommend taking her flexeril.    Anxiety/ADHD: patient reports that her anxiety and ADHD have been uncontrolled. Patient reports that she is all over the place.  Neurology had previously put in a referral to psychiatry however patient reports she does not want to go there. Will place referral to [facility].  Patient declined anxiety medication at this time.     01/18/2023   10:52 AM  GAD 7 : Generalized Anxiety Score  Nervous, Anxious, on Edge 2  Control/stop worrying 2  Worry too much - different things 2  Trouble relaxing 2  Restless 2  Easily annoyed or irritable 2  Afraid - awful might happen 2  Total GAD 7 Score 14  Anxiety Difficulty Somewhat difficult      03/08/2023    9:07 AM 01/18/2023    9:44 AM  Depression screen PHQ 2/9  Decreased Interest 0 0  Down, Depressed, Hopeless 0 0  PHQ - 2 Score 0 0        Relevant past medical, surgical, family and social history reviewed and updated as indicated. Interim medical history since our last visit reviewed. Allergies and medications reviewed and updated.  Review of Systems  Constitutional: Negative for fever or weight change.  Respiratory: Negative for cough and shortness of breath.   Cardiovascular: Negative for chest pain or palpitations.  Gastrointestinal: Negative for abdominal pain, no bowel changes.  Musculoskeletal: Negative for gait problem or joint swelling. Positive for neck pain Skin: Negative for rash.  Neurological: Negative for dizziness or headache.  No other specific complaints in a complete review of systems (except as listed in HPI above).      Objective:    BP 118/72   Pulse 98   Temp 98.1 F (36.7 C) (Oral)   Resp 16   Ht 5\' 2"  (1.575 m)   Wt 158 lb 12.8 oz (72 kg)   SpO2 99%   BMI 29.04 kg/m   Wt Readings from Last 3 Encounters:  03/08/23 158 lb 12.8 oz (72 kg)  01/18/23 159 lb 12.8 oz (72.5 kg)  11/06/22 158 lb (71.7 kg)    Physical Exam  Constitutional: Patient appears well-developed and well-nourished.  No distress.  HEENT: head atraumatic, normocephalic, pupils equal and reactive to light, neck supple, throat within normal limits Cardiovascular: Normal rate, regular rhythm and normal heart sounds.  No murmur heard. No BLE edema. Pulmonary/Chest: Effort normal and breath sounds normal. No respiratory distress. Abdominal: Soft.  There is no tenderness. Psychiatric: Patient has a normal mood and affect. behavior is normal. Judgment and thought content normal.      Assessment & Plan:   Problem List Items Addressed This Visit       Other   History of ADHD    Referral placed to [facility]      GAD (generalized anxiety disorder)    Referral placed to [facility]      Other Visit Diagnoses     Neck pain    -  Primary   cspine xray ordered,  information provided for chiropractor. take flexeril and naproxen   Relevant Medications   naproxen (NAPROSYN) 500 MG tablet   Other Relevant Orders   DG Cervical Spine Complete   Nausea       persistent nausea, recommend follow up with GI        Follow up plan: Return if symptoms worsen or fail to improve.

## 2023-03-08 ENCOUNTER — Ambulatory Visit (INDEPENDENT_AMBULATORY_CARE_PROVIDER_SITE_OTHER): Payer: Medicaid Other | Admitting: Nurse Practitioner

## 2023-03-08 ENCOUNTER — Telehealth: Payer: Self-pay | Admitting: Gastroenterology

## 2023-03-08 ENCOUNTER — Ambulatory Visit
Admission: RE | Admit: 2023-03-08 | Discharge: 2023-03-08 | Disposition: A | Discharge status: Home / Self Care | Payer: Medicaid Other | Source: Ambulatory Visit | Attending: Nurse Practitioner | Admitting: Nurse Practitioner

## 2023-03-08 ENCOUNTER — Encounter: Payer: Self-pay | Admitting: Nurse Practitioner

## 2023-03-08 ENCOUNTER — Other Ambulatory Visit: Payer: Self-pay

## 2023-03-08 ENCOUNTER — Ambulatory Visit
Admission: RE | Admit: 2023-03-08 | Discharge: 2023-03-08 | Disposition: A | Discharge status: Home / Self Care | Payer: Medicaid Other | Attending: Nurse Practitioner | Admitting: Nurse Practitioner

## 2023-03-08 VITALS — BP 118/72 | HR 98 | Temp 98.1°F | Resp 16 | Ht 62.0 in | Wt 158.8 lb

## 2023-03-08 DIAGNOSIS — F411 Generalized anxiety disorder: Secondary | ICD-10-CM

## 2023-03-08 DIAGNOSIS — R110 Nausea: Secondary | ICD-10-CM

## 2023-03-08 DIAGNOSIS — M542 Cervicalgia: Secondary | ICD-10-CM | POA: Insufficient documentation

## 2023-03-08 DIAGNOSIS — Z8659 Personal history of other mental and behavioral disorders: Secondary | ICD-10-CM

## 2023-03-08 MED ORDER — NAPROXEN 500 MG PO TABS
500.0000 mg | ORAL_TABLET | Freq: Two times a day (BID) | ORAL | 0 refills | Status: DC
Start: 2023-03-08 — End: 2023-05-03

## 2023-03-08 NOTE — Telephone Encounter (Signed)
Patient called in to schedule her a visit and, she wanted to be added to the waiting list because she is having stomach problem and very nausea.

## 2023-03-08 NOTE — Assessment & Plan Note (Signed)
Referral placed to [facility].  

## 2023-03-08 NOTE — Telephone Encounter (Signed)
Patient last office visit was 04/20/2022 and was sent a message to make a appointment on 03/05/2023

## 2023-03-09 ENCOUNTER — Telehealth: Payer: Self-pay | Admitting: Nurse Practitioner

## 2023-03-09 NOTE — Telephone Encounter (Signed)
Copied from CRM [id number]. Topic: General - Other >> Mar 09, 2023 12:18 PM Turkey B wrote: Reason for CRM: pt called in  about Dr's note about back problem she has and had back surgery, and not supposed to lift heavy things. She can work  but not lift heavy things, but doesn't know what the weight limit should be.

## 2023-03-09 NOTE — Telephone Encounter (Signed)
Please advise 

## 2023-03-12 NOTE — Telephone Encounter (Signed)
Pt called in about Dr's note, I let her know she would have to see, pain management , orto or PT, she says she will see her pain management Dr.

## 2023-03-21 ENCOUNTER — Telehealth: Payer: Self-pay

## 2023-03-21 NOTE — Telephone Encounter (Signed)
Patient states she will try drinking a bottle of magnesium citrate and see if It helps

## 2023-03-21 NOTE — Telephone Encounter (Signed)
Patient called office at this time-she states her nausea is worse-she is currently taking Linzess 72 mcg daily and taking fiber gummies- she is very constipated-she stated she is not drinking much water-she has zofran - her abdominal is bloated- her last bowel movement was a few days ago. She is wondering if she needs a higher dose. Patient was instructed to drink at least 34 ounces of fluid daily. Please call patient and advise.

## 2023-03-26 ENCOUNTER — Telehealth: Payer: Self-pay

## 2023-03-26 DIAGNOSIS — K5909 Other constipation: Secondary | ICD-10-CM

## 2023-03-26 NOTE — Telephone Encounter (Signed)
Return patient call and she states she went and got the magnesium citrate and they pharmacist scared her and she drunk half the bottle at one sitting and then half the bottle the next day. She states she then started having loose stools but not a large amount of stool. She states she feels like more stool needs to come out. She is going every morning after her morning class of coffee. She states last night she was sleeping and woke her self up on vomit. She states she was chocking. She states that she sleeps on her back because she has to because she had back surgery. She states since then she has had burning in her throat, stomach and has had burning when she has bowel movements. She has had 2 bowel movements today. Denies any blood in stools. She takes pantoprazole 40mg  daily

## 2023-03-26 NOTE — Telephone Encounter (Signed)
X-ray KUB for history of chronic constipation Cleanout with MiraLAX bowel prep Increase Linzess to 145 mcg daily after cleanout  Increase Protonix 40 mg to twice daily for burning in the throat  RV

## 2023-03-26 NOTE — Telephone Encounter (Signed)
Pt lmovm requesting call back from Dr Verdis Prime assistant, she has questions

## 2023-03-27 ENCOUNTER — Ambulatory Visit: Admission: RE | Admit: 2023-03-27 | Payer: Medicaid Other | Source: Ambulatory Visit

## 2023-03-27 ENCOUNTER — Ambulatory Visit
Admission: RE | Admit: 2023-03-27 | Discharge: 2023-03-27 | Disposition: A | Discharge status: Home / Self Care | Payer: Medicaid Other | Attending: Gastroenterology | Admitting: Gastroenterology

## 2023-03-27 DIAGNOSIS — K5909 Other constipation: Secondary | ICD-10-CM | POA: Insufficient documentation

## 2023-03-27 MED ORDER — PANTOPRAZOLE SODIUM 40 MG PO TBEC: 40.0000 mg | DELAYED_RELEASE_TABLET | Freq: Two times a day (BID) | ORAL | 0 refills | Status: DC

## 2023-03-27 MED ORDER — LINACLOTIDE 145 MCG PO CAPS: 145.0000 ug | ORAL_CAPSULE | Freq: Every day | ORAL | 0 refills | Status: DC

## 2023-03-27 NOTE — Addendum Note (Signed)
Addended by: Radene Knee L on: 03/27/2023 08:42 AM   Modules accepted: Orders

## 2023-03-27 NOTE — Telephone Encounter (Signed)
Called patient and patient verbalized understanding of results. Patient wanted me to send them to [facility] also. Sent them to [employer]

## 2023-03-29 ENCOUNTER — Telehealth: Payer: Self-pay

## 2023-03-29 DIAGNOSIS — R110 Nausea: Secondary | ICD-10-CM

## 2023-03-29 DIAGNOSIS — K5909 Other constipation: Secondary | ICD-10-CM

## 2023-03-29 DIAGNOSIS — R1031 Right lower quadrant pain: Secondary | ICD-10-CM

## 2023-03-29 NOTE — Telephone Encounter (Signed)
Order labs sent mychart message with results

## 2023-03-29 NOTE — Telephone Encounter (Signed)
-----   Message from [facility] sent at 03/29/2023  4:26 PM EDT ----- X-ray KUB came back normal.  Recommend H. pylori breath test and celiac disease panel  RV

## 2023-04-02 ENCOUNTER — Telehealth: Payer: Self-pay | Admitting: Gastroenterology

## 2023-04-02 DIAGNOSIS — R1031 Right lower quadrant pain: Secondary | ICD-10-CM

## 2023-04-02 MED ORDER — HYDROCORTISONE (PERIANAL) 2.5 % EX CREA: 1.0000 | TOPICAL_CREAM | Freq: Two times a day (BID) | CUTANEOUS | 0 refills | Status: DC

## 2023-04-02 NOTE — Addendum Note (Signed)
Addended by: Radene Knee L on: 04/02/2023 04:57 PM   Modules accepted: Orders

## 2023-04-02 NOTE — Telephone Encounter (Signed)
Patient called in because she stated her stool looks like it had a clot of blood.

## 2023-04-02 NOTE — Telephone Encounter (Signed)
Check CBC along with other labs that we already ordered.  She did not get celiac disease panel, H. pylori breath test done.  Looks like, she has flareup of hemorrhoids causing rectal bleeding.  Recommend Anusol cream twice daily.  Do not take Linzess until her diarrhea improves  RV

## 2023-04-02 NOTE — Telephone Encounter (Signed)
Order lab work. Sent medication to the pharmacy. Called and left a message for call back

## 2023-04-02 NOTE — Telephone Encounter (Signed)
Patient states she did the Miralax Prep and started having diarrhea. She states she noticed that she had Dark red to red blood clot in the toilet. She states it was not a lot of blood that came out just was a clot. She states this happen on 03/29/2023 She states ever since then smaller clots have came out since then. She has been having diarrhea since then. She sates this has been happening twice a day.

## 2023-04-26 ENCOUNTER — Other Ambulatory Visit: Payer: Self-pay | Admitting: Gastroenterology

## 2023-04-30 ENCOUNTER — Telehealth: Payer: Self-pay | Admitting: Gastroenterology

## 2023-04-30 NOTE — Telephone Encounter (Signed)
Patient called in because she had some questions to ask.

## 2023-05-01 ENCOUNTER — Other Ambulatory Visit: Payer: Self-pay

## 2023-05-02 NOTE — Progress Notes (Signed)
Celso Amy, PA-C [address]  [address]  [place], [place] [address]  Main: [phone]  Fax: [phone]   Gastroenterology Consultation  Referring Provider:     Berniece Salines, FNP Primary Care Physician:  Berniece Salines, FNP Primary Gastroenterologist:  Celso Amy, PA-C / Dr. Lannette Donath   Reason for Consultation:     Rectal Bleeding, Constipation        HPI:   Maria [name] is a 32 y.o. y/o female referred for consultation & management  by Berniece Salines, FNP.    Patient presents to evaluate rectal bleeding and constipation.  She has had the symptoms intermittently for many years.  History of constipation for at least 7 years.  Patient last saw Dr. Allegra Lai 03/2022.  She is currently taking Linzess 145 mcg 1 tablet every other day.  She tried Linzess 72 mcg once daily which did not control her constipation.  She has intermittent sharp LLQ pain and gas when she is constipated.  She has had a few episodes of bright red blood in the toilet and on the tissue after having hard bowel movement.  She has not had a lot of heavy bleeding.  She is using external hemorrhoid cream which helps her hemorrhoids.  She denies unintentional weight loss, anemia, or alarm symptoms.  Labs 01/18/2023 showed normal CBC and CMP.  Normal LFTs.  HCV and HIV nonreactive.  Normal lipase.  H. pylori breath test and celiac disease panel labs were ordered, yet not completed.  Flexible sigmoidoscopy 11/2021 (to evaluate rectal bleeding) to the mid transverse colon was normal.  Abdominal x-ray 03/2023 showed no acute abnormality.  Complete abdominal ultrasound 10/2021 was normal.  No gallstones.  CT Abd Pelvis with Contrast 03/2020: No acute abnormality.  Past Medical History:  Diagnosis Date   Acute pancreatitis    Asthma    GERD (gastroesophageal reflux disease)    Hemorrhoid    Hx of varicella    Ovarian cyst    Recurrent boils    legs and buttocks.  staff infection, was neg    Thrombocytosis 06/09/2016   UTI (urinary tract infection)     Past Surgical History:  Procedure Laterality Date   CESAREAN SECTION N/A 05/25/2016   Procedure: CESAREAN SECTION;  Surgeon: Gerald Leitz, MD;  Location: [facility];  Service: Obstetrics;  Laterality: N/A;   FLEXIBLE SIGMOIDOSCOPY N/A 11/30/2021   Procedure: FLEXIBLE SIGMOIDOSCOPY;  Surgeon: Toney Reil, MD;  Location: [facility] ENDOSCOPY;  Service: Gastroenterology;  Laterality: N/A;   TONSILLECTOMY     WISDOM TOOTH EXTRACTION      Prior to Admission medications   Medication Sig Start Date End Date Taking? Authorizing Provider  albuterol (VENTOLIN HFA) 108 (90 Base) MCG/ACT inhaler Inhale 2 puffs into the lungs every 6 (six) hours as needed for wheezing or shortness of breath. 01/27/22   Tommi Rumps, PA-C  cetirizine (ZYRTEC) 10 MG tablet Take 1 tablet by mouth daily. 12/23/19   [provider]  clindamycin (CLEOCIN T) 1 % external solution Apply twice daily to scalp 06/07/22   Deirdre Evener, MD  cyclobenzaprine (FLEXERIL) 10 MG tablet Take 1 tablet by mouth every 8 (eight) hours.    [provider]  doxycycline (PERIOSTAT) 20 MG tablet Take 1 tablet (20 mg total) by mouth 2 (two) times daily. Take with food 06/07/22   Deirdre Evener, MD  estradiol (ESTRACE) 0.1 MG/GM vaginal cream Place vaginally. 02/28/23   [provider]  hydrocortisone (ANUSOL-HC) 2.5 % rectal cream Place 1 Application rectally 2 (two) times daily. 04/02/23   Toney Reil, MD  ketoconazole (NIZORAL) 2 % shampoo Apply 1 Application topically as directed. Shampoo scalp and face, avoid eye,  at least 3 times weekly, let sit several minutes and rinse out 06/07/22   Deirdre Evener, MD  linaclotide [facility]) 145 MCG CAPS capsule Take 1 capsule (145 mcg total) by mouth daily before breakfast. 03/27/23   Toney Reil, MD  medroxyPROGESTERone Acetate 150 MG/ML SUSY Inject 1 mL into the muscle every 3 (three)  months. 10/19/21   [provider]  montelukast (SINGULAIR) 10 MG tablet TAKE 1 TABLET BY MOUTH EVERYDAY AT BEDTIME 08/29/22   Deirdre Evener, MD  naproxen (NAPROSYN) 500 MG tablet Take 1 tablet (500 mg total) by mouth 2 (two) times daily with a meal. 03/08/23   Berniece Salines, FNP  ondansetron (ZOFRAN) 4 MG tablet Take 1 tablet (4 mg total) by mouth every 8 (eight) hours as needed for nausea or vomiting. 04/20/22   Toney Reil, MD  pantoprazole (PROTONIX) 40 MG tablet TAKE 1 TABLET (40 MG TOTAL) BY MOUTH TWICE A DAY BEFORE MEALS 04/26/23   Toney Reil, MD    Family History  Problem Relation Age of Onset   Hypertension Maternal Grandmother    Heart disease Maternal Grandfather    Stroke Maternal Grandfather    Diabetes Neg Hx    Cancer Neg Hx      Social History   Tobacco Use   Smoking status: Former    Current packs/day: 0.00    Types: Cigarettes    Quit date: 11/07/2013    Years since quitting: 9.4   Smokeless tobacco: Former  Building services engineer status: Never Used  Substance Use Topics   Alcohol use: Yes    Alcohol/week: 1.0 standard drink of alcohol    Types: 1 Cans of beer per week    Comment: occasional not while preg   Drug use: No    Allergies as of 05/03/2023 - Review Complete 05/03/2023  Allergen Reaction Noted   Bactrim ds [sulfamethoxazole-trimethoprim] Itching and Rash 02/07/2023   Amoxicillin Rash 11/16/2020   Penicillins Hives and Other (See Comments) 09/08/2012    Review of Systems:    All systems reviewed and negative except where noted in HPI.   Physical Exam:  BP 126/84   Pulse 98   Temp 98.2 F (36.8 C)   Ht 5\' 2"  (1.575 m)   Wt 155 lb 12.8 oz (70.7 kg)   BMI 28.50 kg/m  No LMP recorded. Patient has had an injection.  General:   Alert,  Well-developed, well-nourished, pleasant and cooperative in NAD Lungs:  Respirations even and unlabored.  Clear throughout to auscultation.   No wheezes, crackles, or rhonchi. No acute  distress. Heart:  Regular rate and rhythm; no murmurs, clicks, rubs, or gallops. Abdomen:  Normal bowel sounds.  No bruits.  Soft, and non-distended without masses, hepatosplenomegaly or hernias noted.  Mild LLQ Tenderness.  Rest of abdomen is not tender.  No guarding or rebound tenderness.    Rectal:  NO external Hemorrhoids, lesions, rashes or fissure.   Anoscopy:  Internal Hemorrhoids which are swollen but Not bleeding. Psych:  Alert and cooperative. Anxious mood and affect.  Imaging Studies: No results found.  Assessment and Plan:   Kristana Eberly is a 32 y.o. y/o female has been referred for:  1.  Rectal Bleeding; chronic  and intermittent for many years, most likely due to chronic constipation and  internal hemorrhoids.  She has internal hemorrhoids on exam today.  No evidence of external hemorrhoids or fissure.  Flexible sigmoidoscopy to the mid transverse colon done 11/2021 was normal.  2.  Chronic constipation  Instructed her to take Linzess 145 mcg 1 capsule every day consistently.  Drink 64 ounces of water daily and eat 30 g of fiber foods daily.  3.  Internal Hemorrhoids  Prescribed hydrocortisone suppository 25 Mg 1 into the rectum once daily at bedtime #12 with 1 refill.  Stressed the importance of treating underlying constipation to prevent hemorrhoid bleeding.   Follow up in 4 weeks with TG.  Celso Amy, PA-C

## 2023-05-03 ENCOUNTER — Ambulatory Visit (INDEPENDENT_AMBULATORY_CARE_PROVIDER_SITE_OTHER): Payer: Medicaid Other | Admitting: Physician Assistant

## 2023-05-03 ENCOUNTER — Encounter: Payer: Self-pay | Admitting: Physician Assistant

## 2023-05-03 VITALS — BP 126/84 | HR 98 | Temp 98.2°F | Ht 62.0 in | Wt 155.8 lb

## 2023-05-03 DIAGNOSIS — K625 Hemorrhage of anus and rectum: Secondary | ICD-10-CM | POA: Diagnosis not present

## 2023-05-03 DIAGNOSIS — K5909 Other constipation: Secondary | ICD-10-CM | POA: Diagnosis not present

## 2023-05-03 DIAGNOSIS — K5904 Chronic idiopathic constipation: Secondary | ICD-10-CM

## 2023-05-03 DIAGNOSIS — K581 Irritable bowel syndrome with constipation: Secondary | ICD-10-CM

## 2023-05-03 DIAGNOSIS — K648 Other hemorrhoids: Secondary | ICD-10-CM

## 2023-05-03 DIAGNOSIS — K649 Unspecified hemorrhoids: Secondary | ICD-10-CM

## 2023-05-03 MED ORDER — HYDROCORTISONE ACETATE 25 MG RE SUPP: 25.0000 mg | Freq: Every day | RECTAL | 1 refills | Status: AC

## 2023-05-03 MED ORDER — LINACLOTIDE 145 MCG PO CAPS: 145.0000 ug | ORAL_CAPSULE | Freq: Every day | ORAL | 1 refills | Status: DC

## 2023-05-26 ENCOUNTER — Other Ambulatory Visit: Payer: Self-pay | Admitting: Gastroenterology

## 2023-05-30 ENCOUNTER — Ambulatory Visit: Payer: Self-pay | Admitting: *Deleted

## 2023-05-30 NOTE — Telephone Encounter (Signed)
Reason for Disposition  Patient sounds very sick or weak to the triager  Answer Assessment - Initial Assessment Questions 1. DESCRIPTION: "Please describe your heart rate or heartbeat that you are having" (e.g., fast/slow, regular/irregular, skipped or extra beats, "palpitations")     My heart was beating  fast a couple of nights ago.   My left arm went numb and tingling too.   I could not sleep because of it.   This only happens with my arm when I'm laying in bed.   I've had pinched nerve problems before so I don't know if that is causing my arm to feel this way or not.  I was sick vomiting on Sun. And Mon.    I have a bladder infection so I'm on antibiotics for it now.   I could not sleep.  My heart was racing real fast.   I have high BP.   It's worse when I lay on my left side.    My chest is tight to sore after it happens the next day. Middle of my chest to left chest and my left arm was numb and tingling.  I've had problems with pinched nerves. (She was jumping from one subject to another and talking very fast).  2. ONSET: "When did it start?" (Minutes, hours or days)      My chest feels a little tight now.   Denies being short of breath.   No dizziness.  I'm usually laying in bed when my arm goes numb.  That's the only time it happens.      Numb and tingling in left arm night before last.    3. DURATION: "How long does it last" (e.g., seconds, minutes, hours)     The tightness is there now.   "It feels like bricks on my chest or something" Before I was sick vomiting at the beginning of the week I was having problems where I get bad migraines, blurred vision, nausea and bad headaches.   I have to lay down when it happens.   I don't know if that affects anything or not. 4. PATTERN "Does it come and go, or has it been constant since it started?"  "Does it get worse with exertion?"   "Are you feeling it now?"     The pressure is there now in my chest. 5. TAP: "Using your hand, can you tap out  what you are feeling on a chair or table in front of you, so that I can hear?" (Note: not all patients can do this)       Not asked 6. HEART RATE: "Can you tell me your heart rate?" "How many beats in 15 seconds?"  (Note: not all patients can do this)       Not asked but she says she can feel her heart racing when she has the episodes of feeling it beating fast. 7. RECURRENT SYMPTOM: "Have you ever had this before?" If Yes, ask: "When was the last time?" and "What happened that time?"      No 8. CAUSE: "What do you think is causing the palpitations?"     I don't know.   I think my arm goes numb when I'm laying down because of pinched nerves. 9. CARDIAC HISTORY: "Do you have any history of heart disease?" (e.g., heart attack, angina, bypass surgery, angioplasty, arrhythmia)      No 10. OTHER SYMPTOMS: "Do you have any other symptoms?" (e.g., dizziness, chest pain, sweating, difficulty breathing)  Bad headaches, migraines, vomiting on Sun. And Mon. But not now. 11. PREGNANCY: "Is there any chance you are pregnant?" "When was your last menstrual period?"       Not asked  Protocols used: Heart Rate and Heartbeat Questions-A-AH

## 2023-05-30 NOTE — Telephone Encounter (Signed)
  Chief Complaint: C/o chest tightness now. Symptoms: Left arm numbness and tingling when she lays down.   Has occasions where she feels her heart racing.   Tight in chest in middle and to the left of her chest which radiates into numbness and tingling down her left arm. Frequency: Now having the tightness but had episodes of heart racing on Sun. Pertinent Negatives: Patient denies shortness of breath, dizziness, or sweating Disposition: [x] ED /[] Urgent Care (no appt availability in office) / [] Appointment(In office/virtual)/ []  [facility]/ [] Home Care/ [] Refused Recommended Disposition /[] [facility]/ []  Follow-up with PCP Additional Notes: Due to having chest tightness now I have referred her to the ED.   She is agreeable to going.   Going to call her mother first.   She was driving while I was talking with her. Message sent to Della Goo, FNP.  (Pt called in while office closed for lunch).

## 2023-05-31 NOTE — Telephone Encounter (Signed)
FYI

## 2023-06-01 ENCOUNTER — Emergency Department: Payer: Medicaid Other

## 2023-06-01 ENCOUNTER — Emergency Department
Admission: EM | Admit: 2023-06-01 | Discharge: 2023-06-01 | Disposition: A | Discharge status: Home / Self Care | Payer: Medicaid Other | Attending: Emergency Medicine | Admitting: Emergency Medicine

## 2023-06-01 ENCOUNTER — Other Ambulatory Visit: Payer: Self-pay

## 2023-06-01 DIAGNOSIS — R079 Chest pain, unspecified: Secondary | ICD-10-CM | POA: Diagnosis present

## 2023-06-01 LAB — CBC
HCT: 41.6 % (ref 36.0–46.0)
Hemoglobin: 13.8 g/dL (ref 12.0–15.0)
MCH: 30.9 pg (ref 26.0–34.0)
MCHC: 33.2 g/dL (ref 30.0–36.0)
MCV: 93.1 fL (ref 80.0–100.0)
Platelets: 230 10*3/uL (ref 150–400)
RBC: 4.47 MIL/uL (ref 3.87–5.11)
RDW: 12.7 % (ref 11.5–15.5)
WBC: 5.2 10*3/uL (ref 4.0–10.5)
nRBC: 0 % (ref 0.0–0.2)

## 2023-06-01 LAB — BASIC METABOLIC PANEL
Anion gap: 11 (ref 5–15)
BUN: 10 mg/dL (ref 6–20)
CO2: 22 mmol/L (ref 22–32)
Calcium: 9 mg/dL (ref 8.9–10.3)
Chloride: 105 mmol/L (ref 98–111)
Creatinine, Ser: 1.06 mg/dL — ABNORMAL HIGH (ref 0.44–1.00)
GFR, Estimated: >60 mL/min (ref >60)
Glucose, Bld: 94 mg/dL (ref 70–99)
Potassium: 4.1 mmol/L (ref 3.5–5.1)
Sodium: 138 mmol/L (ref 135–145)

## 2023-06-01 LAB — TROPONIN I (HIGH SENSITIVITY): Troponin I (High Sensitivity): <2 ng/L (ref <18)

## 2023-06-01 MED ORDER — ACETAMINOPHEN 500 MG PO TABS
1000.0000 mg | ORAL_TABLET | Freq: Once | ORAL | Status: AC
  Administered 2023-06-01: 1000 mg via ORAL
  Filled 2023-06-01: qty 2

## 2023-06-01 NOTE — ED Provider Notes (Signed)
[facility] Provider Note    Event Date/Time   First MD Initiated Contact with Patient 06/01/23 1452     (approximate)   History   Chest Pain   HPI  Maria [name] is a 32 y.o. female past medical history significant for anxiety, who presents to the emergency department with chest pain.  Patient states that she woke up from sleep this morning with chest pain.  Feels like it is worse with deep inspiration.  Felt like her heart was racing and then would skip a beat.  Felt like her pain was radiating to her left side.  Endorses intermittent episodes of numbness and tingling sensation.  Does state that she has a history of indigestion.  Is currently on a PPI.  Takes Depo and no concern for pregnancy.  No history of DVT or PE.  No family history coronary artery disease at a young age.  No recent travel.  No active chest pain at this time.  States she was started on ciprofloxacin for an episode of a complicated urinary tract infection.  Denies any ongoing symptoms of UTI at this time.  No back pain, nausea vomiting or fever.     Physical Exam   Triage Vital Signs: ED Triage Vitals  Encounter Vitals Group     BP 06/01/23 1348 128/84     Systolic BP Percentile --      Diastolic BP Percentile --      Pulse Rate 06/01/23 1347 84     Resp 06/01/23 1347 18     Temp 06/01/23 1347 98.3 F (36.8 C)     Temp src --      SpO2 06/01/23 1347 98 %     Weight 06/01/23 1347 158 lb (71.7 kg)     Height 06/01/23 1347 5\' 2"  (1.575 m)     Head Circumference --      Peak Flow --      Pain Score 06/01/23 1347 10     Pain Loc --      Pain Education --      Exclude from Growth Chart --     Most recent vital signs: Vitals:   06/01/23 1348 06/01/23 1446  BP: 128/84 126/74  Pulse:  81  Resp:  18  Temp:    SpO2:  98%    Physical Exam Constitutional:      Appearance: She is well-developed.  HENT:     Head: Atraumatic.  Eyes:     Conjunctiva/sclera:  Conjunctivae normal.  Cardiovascular:     Rate and Rhythm: Regular rhythm.     Heart sounds: Normal heart sounds. No murmur heard. Pulmonary:     Effort: No respiratory distress.  Abdominal:     General: There is no distension.     Palpations: Abdomen is soft.  Musculoskeletal:        General: Normal range of motion.     Cervical back: Normal range of motion.     Right lower leg: No edema.     Left lower leg: No edema.  Skin:    General: Skin is warm.     Capillary Refill: Capillary refill takes less than 2 seconds.  Neurological:     General: No focal deficit present.     Mental Status: She is alert. Mental status is at baseline.     GCS: GCS eye subscore is 4. GCS verbal subscore is 5. GCS motor subscore is 6.     Cranial Nerves:  Cranial nerves 2-12 are intact.     Sensory: Sensation is intact.     Motor: Motor function is intact.     Coordination: Coordination is intact.     Gait: Gait is intact.     IMPRESSION / MDM / ASSESSMENT AND PLAN / ED COURSE  I reviewed the triage vital signs and the nursing notes.  Differential diagnosis including ACS, anemia, pneumonia, pericarditis, pleurisy, stress/anxiety, intracranial hemorrhage, demyelinating syndrome given her intermittent episodes of paresthesias  EKG  I, Corena Herter, the attending physician, personally viewed and interpreted this ECG.   Rate: Normal  Rhythm: Normal sinus  Axis: Normal  Intervals: Normal  ST&T Change: None  No tachycardic or bradycardic dysrhythmias while on cardiac telemetry.  RADIOLOGY I independently reviewed imaging, my interpretation of imaging: Chest x-ray no signs of pneumonia.  CT scan of the head without signs of intracranial hemorrhage or infarction  LABS (all labs ordered are listed, but only abnormal results are displayed) Labs interpreted as -    Labs Reviewed  BASIC METABOLIC PANEL - Abnormal; Notable for the following components:      Result Value   Creatinine, Ser 1.06  (*)    All other components within normal limits  CBC  TROPONIN I (HIGH SENSITIVITY)     MDM  Lab work reassuring.  Creatinine at baseline.  No significant electrolyte abnormalities.  Patient without concern for pregnancy.  Troponin is undetectable.  Low risk heart score, do not feel that serial troponins are necessary at this time.  Low risk Wells criteria, PERC negative, have a low suspicion for pulmonary embolism  Clinical picture is most consistent with stress anxiety, does state that she has a follow-up appointment with a therapist this next week.  Does state that she has significant amount of stress in her life.  Given return precautions for any ongoing or worsening symptoms.  Discussed close follow-up with primary care physician.  Discussed decreasing caffeine intake and possible Holter monitor.  Discussed if she had return of symptoms to return to the emergency department.  No questions at time of discharge.     PROCEDURES:  Critical Care performed: No  Procedures  Patient's presentation is most consistent with acute presentation with potential threat to life or bodily function.   MEDICATIONS ORDERED IN ED: Medications  acetaminophen (TYLENOL) tablet 1,000 mg (1,000 mg Oral Given 06/01/23 1531)    FINAL CLINICAL IMPRESSION(S) / ED DIAGNOSES   Final diagnoses:  Chest pain, unspecified type     Rx / DC Orders   ED Discharge Orders     None        Note:  This document was prepared using Dragon voice recognition software and may include unintentional dictation errors.   Corena Herter, MD 06/01/23 1605

## 2023-06-01 NOTE — ED Triage Notes (Signed)
Pt to ED for chest pain for a couple days. Worsening pain with inhalation. Reports recently on antibiotics for UTI. NAD noted. RR Even and unlabored.  Thinks she's having allergic rxn to cipro

## 2023-06-01 NOTE — Discharge Instructions (Signed)
You are seen in the emergency department for chest pain.  Your heart enzyme was negative, no concern for a heart attack today.  Do not believe that you are having symptoms of a blood clot.  Your chest x-ray did not show any signs of pneumonia.  The CT scan done of your head was normal and did not show any abnormalities.  Your neurologic exam was normal.  Your heart and lungs sounded good today.  Follow-up closely with your primary care physician, if you have recurrent episodes of heart palpitations or feeling like your heart is skipping a beat you may need a Holter monitor.  Attempt to cut back on your caffeine use.  Concern some of your symptoms today could be from stress and anxiety, it is important that you follow-up as an outpatient for further help with stress and anxiety.  Return to the emergency department if you have any return or worsening symptoms.  Pain control:  Ibuprofen (motrin/aleve/advil) - You can take 3 tablets (600 mg) every 6 hours as needed for pain/fever.  Acetaminophen (tylenol) - You can take 2 extra strength tablets (1000 mg) every 6 hours as needed for pain/fever.  You can alternate these medications or take them together.  Make sure you eat food/drink water when taking these medications.  Thank you for choosing [place] for your health care, it was my pleasure to care for you today!  Corena Herter, MD

## 2023-06-06 ENCOUNTER — Ambulatory Visit: Payer: Self-pay | Admitting: *Deleted

## 2023-06-06 NOTE — Telephone Encounter (Signed)
You are having c Reason for Disposition  [1] Chest pain lasts < 5 minutes AND [2] NO chest pain or cardiac symptoms (e.g., breathing difficulty, sweating) now  (Exception: Chest pains that last only a few seconds.)  Answer Assessment - Initial Assessment Questions 1. LOCATION: "Where does it hurt?"       Chest pain with pain going down my left arm with nausea.   I went to ED.  They checked me out and said everything was ok at the moment. That night at 3:00 AM I had a tight chest with arm numbness before going to the ED. I have spells have nausea, lightheaded, fatigue, dizziness, hot, feel like I'm going to pass out and bad headache my vision was blurry.   I'm having tightness in my chest now.   Bad headache.   Tingling all over my body with nausea.    I took nausea medicine.    So I'm better. I have a psychiatrist and spoke with them.   I have anxiety.   I'm wondering about my sugar.   Is it getting low?    I want to see my dr and see if my sugar is going low.     I started with a headache and migraine and nausea.   The middle of my chest is tight and I'm having tingling all over my body now.   I took Cipro for a UTI a while back and I"ve had the tingling in my body since then.    2. RADIATION: "Does the pain go anywhere else?" (e.g., into neck, jaw, arms, back)     Tingling all over my body.   I'm urinating very frequently.    I don't think I'm having a UTI.     They checked me in the ED and I did not have a UTI.     3. ONSET: "When did the chest pain begin?" (Minutes, hours or days)      Still having these issues. 4. PATTERN: "Does the pain come and go, or has it been constant since it started?"  "Does it get worse with exertion?"      It's intermittent I'm having anxiety because of the unknown.    5. DURATION: "How long does it last" (e.g., seconds, minutes, hours)     Intermittent 6. SEVERITY: "How bad is the pain?"  (e.g., Scale 1-10; mild, moderate, or severe)    - MILD (1-3):  doesn't interfere with normal activities     - MODERATE (4-7): interferes with normal activities or awakens from sleep    - SEVERE (8-10): excruciating pain, unable to do any normal activities       Moderate 7. CARDIAC RISK FACTORS: "Do you have any history of heart problems or risk factors for heart disease?" (e.g., angina, prior heart attack; diabetes, high blood pressure, high cholesterol, smoker, or strong family history of heart disease)     Not asked   because seen in ED 8. PULMONARY RISK FACTORS: "Do you have any history of lung disease?"  (e.g., blood clots in lung, asthma, emphysema, birth control pills)     Not asked 9. CAUSE: "What do you think is causing the chest pain?"     I don't know 10. OTHER SYMPTOMS: "Do you have any other symptoms?" (e.g., dizziness, nausea, vomiting, sweating, fever, difficulty breathing, cough)       See above 11. PREGNANCY: "Is there any chance you are pregnant?" "When was your last menstrual period?"  Not asked  Protocols used: Chest Pain-A-AH

## 2023-06-06 NOTE — Progress Notes (Unsigned)
Celso Amy, PA-C [address]  [address]  [place], [place] [address]  Main: [phone]  Fax: [phone]   Primary Care Physician: Berniece Salines, FNP  Primary Gastroenterologist:  Celso Amy, PA-C / Dr. Lannette Donath    CC: Follow-up rectal bleeding and constipation  HPI: Maria [name] is a 32 y.o. female  Current Outpatient Medications  Medication Sig Dispense Refill   albuterol (VENTOLIN HFA) 108 (90 Base) MCG/ACT inhaler Inhale 2 puffs into the lungs every 6 (six) hours as needed for wheezing or shortness of breath. 8 g 2   cetirizine (ZYRTEC) 10 MG tablet Take 1 tablet by mouth daily.     clindamycin (CLEOCIN T) 1 % external solution Apply twice daily to scalp 60 mL 5   cyclobenzaprine (FLEXERIL) 10 MG tablet Take 1 tablet by mouth every 8 (eight) hours.     doxycycline (PERIOSTAT) 20 MG tablet Take 1 tablet (20 mg total) by mouth 2 (two) times daily. Take with food 60 tablet 5   estradiol (ESTRACE) 0.1 MG/GM vaginal cream Place vaginally.     hydrocortisone (ANUSOL-HC) 2.5 % rectal cream Place 1 Application rectally 2 (two) times daily. 30 g 0   ketoconazole (NIZORAL) 2 % shampoo Apply 1 Application topically as directed. Shampoo scalp and face, avoid eye,  at least 3 times weekly, let sit several minutes and rinse out 120 mL 11   linaclotide (LINZESS) 145 MCG CAPS capsule Take 1 capsule (145 mcg total) by mouth daily before breakfast. 90 capsule 1   medroxyPROGESTERone Acetate 150 MG/ML SUSY Inject 1 mL into the muscle every 3 (three) months.     montelukast (SINGULAIR) 10 MG tablet TAKE 1 TABLET BY MOUTH EVERYDAY AT BEDTIME 90 tablet 2   ondansetron (ZOFRAN) 4 MG tablet Take 1 tablet (4 mg total) by mouth every 8 (eight) hours as needed for nausea or vomiting. 30 tablet 0   pantoprazole (PROTONIX) 40 MG tablet TAKE 1 TABLET (40 MG TOTAL) BY MOUTH TWICE A DAY BEFORE MEALS 60 tablet 0   No current facility-administered medications for this visit.     Allergies as of 06/07/2023 - Review Complete 06/01/2023  Allergen Reaction Noted   Bactrim ds [sulfamethoxazole-trimethoprim] Itching and Rash 02/07/2023   Amoxicillin Rash 11/16/2020   Penicillins Hives and Other (See Comments) 09/08/2012    Past Medical History:  Diagnosis Date   Acute pancreatitis    Asthma    GERD (gastroesophageal reflux disease)    Hemorrhoid    Hx of varicella    Ovarian cyst    Recurrent boils    legs and buttocks.  staff infection, was neg   Thrombocytosis 06/09/2016   UTI (urinary tract infection)     Past Surgical History:  Procedure Laterality Date   CESAREAN SECTION N/A 05/25/2016   Procedure: CESAREAN SECTION;  Surgeon: Gerald Leitz, MD;  Location: [facility] BIRTHING SUITES;  Service: Obstetrics;  Laterality: N/A;   FLEXIBLE SIGMOIDOSCOPY N/A 11/30/2021   Procedure: FLEXIBLE SIGMOIDOSCOPY;  Surgeon: Toney Reil, MD;  Location: [facility] ENDOSCOPY;  Service: Gastroenterology;  Laterality: N/A;   TONSILLECTOMY     WISDOM TOOTH EXTRACTION      Review of Systems:    All systems reviewed and negative except where noted in HPI.   Physical Examination:   There were no vitals taken for this visit.  General: Well-nourished, well-developed in no acute distress.  Lungs: Clear to auscultation bilaterally. Non-labored. Heart: Regular rate and rhythm, no murmurs rubs or gallops.  Abdomen: Bowel sounds are normal; Abdomen is Soft; No hepatosplenomegaly, masses or hernias;  No Abdominal Tenderness; No guarding or rebound tenderness. Neuro: Alert and oriented x 3.  Grossly intact.  Psych: Alert and cooperative, normal mood and affect.   Imaging Studies: CT Head Wo Contrast  Result Date: 06/01/2023 CLINICAL DATA:  Headache EXAM: CT HEAD WITHOUT CONTRAST TECHNIQUE: Contiguous axial images were obtained from the base of the skull through the vertex without intravenous contrast. RADIATION DOSE REDUCTION: This exam was performed according to the departmental  dose-optimization program which includes automated exposure control, adjustment of the mA and/or kV according to patient size and/or use of iterative reconstruction technique. COMPARISON:  MRI 11/05/2020 FINDINGS: Brain: No evidence of acute infarction, hemorrhage, hydrocephalus, extra-axial collection or mass lesion/mass effect. Vascular: No hyperdense vessel or unexpected calcification. Skull: Normal. Negative for fracture or focal lesion. Sinuses/Orbits: No acute finding. Other: None IMPRESSION: Negative non contrasted CT appearance of the brain. Electronically Signed   By: Jasmine Pang M.D.   On: 06/01/2023 15:53   DG Chest 2 View  Result Date: 06/01/2023 CLINICAL DATA:  Chest pain EXAM: CHEST - 2 VIEW COMPARISON:  12/27/2022 FINDINGS: The heart size and mediastinal contours are within normal limits. Both lungs are clear. Mild scoliosis of the spine. IMPRESSION: No active cardiopulmonary disease. Electronically Signed   By: Jasmine Pang M.D.   On: 06/01/2023 15:48    Assessment and Plan:   Maria [name] is a 32 y.o. y/o female ***    Celso Amy, PA-C  Follow up ***  BP check ***

## 2023-06-06 NOTE — Telephone Encounter (Signed)
  Chief Complaint: Chest tightness, bad headaches, nausea, tingling all over her body.   Been to ED for the chest tightness everything checked out ok. Symptoms: Anxiety, feeling hot, see above Frequency: Intermittently but happening more frequently.     Pertinent Negatives: Patient denies knowing what is causing all this. Disposition: [] ED /[] Urgent Care (no appt availability in office) / [x] Appointment(In office/virtual)/ []  [facility]/ [] Home Care/ [] Refused Recommended Disposition /[] [facility]/ []  Follow-up with PCP Additional Notes: Appt made with Della Goo, FNP for 06/07/2023 at 11:20.

## 2023-06-07 ENCOUNTER — Encounter: Payer: Self-pay | Admitting: Nurse Practitioner

## 2023-06-07 ENCOUNTER — Other Ambulatory Visit: Payer: Self-pay

## 2023-06-07 ENCOUNTER — Ambulatory Visit: Payer: Medicaid Other | Admitting: Nurse Practitioner

## 2023-06-07 ENCOUNTER — Encounter: Payer: Self-pay | Admitting: Physician Assistant

## 2023-06-07 ENCOUNTER — Ambulatory Visit: Payer: Medicaid Other | Attending: Nurse Practitioner

## 2023-06-07 ENCOUNTER — Ambulatory Visit: Payer: Medicaid Other | Admitting: Physician Assistant

## 2023-06-07 VITALS — BP 112/76 | HR 96 | Temp 97.8°F | Resp 18 | Ht 62.0 in | Wt 154.8 lb

## 2023-06-07 VITALS — BP 111/76 | HR 90 | Temp 98.6°F | Ht 62.0 in | Wt 154.6 lb

## 2023-06-07 DIAGNOSIS — H538 Other visual disturbances: Secondary | ICD-10-CM | POA: Diagnosis not present

## 2023-06-07 DIAGNOSIS — R232 Flushing: Secondary | ICD-10-CM

## 2023-06-07 DIAGNOSIS — R202 Paresthesia of skin: Secondary | ICD-10-CM | POA: Diagnosis not present

## 2023-06-07 DIAGNOSIS — K219 Gastro-esophageal reflux disease without esophagitis: Secondary | ICD-10-CM

## 2023-06-07 DIAGNOSIS — R519 Headache, unspecified: Secondary | ICD-10-CM | POA: Diagnosis not present

## 2023-06-07 DIAGNOSIS — R0602 Shortness of breath: Secondary | ICD-10-CM

## 2023-06-07 DIAGNOSIS — R002 Palpitations: Secondary | ICD-10-CM

## 2023-06-07 DIAGNOSIS — R079 Chest pain, unspecified: Secondary | ICD-10-CM

## 2023-06-07 DIAGNOSIS — R42 Dizziness and giddiness: Secondary | ICD-10-CM

## 2023-06-07 DIAGNOSIS — K581 Irritable bowel syndrome with constipation: Secondary | ICD-10-CM | POA: Diagnosis not present

## 2023-06-07 DIAGNOSIS — F419 Anxiety disorder, unspecified: Secondary | ICD-10-CM

## 2023-06-07 MED ORDER — LINACLOTIDE 290 MCG PO CAPS: 290.0000 ug | ORAL_CAPSULE | Freq: Every day | ORAL | 3 refills | Status: AC

## 2023-06-07 NOTE — Progress Notes (Signed)
BP 112/76   Pulse 96   Temp 97.8 F (36.6 C) (Oral)   Resp 18   Ht 5\' 2"  (1.575 m)   Wt 154 lb 12.8 oz (70.2 kg)   SpO2 99%   BMI 28.31 kg/m    Subjective:    Patient ID: Maria [name], female    DOB: [birth year], 32 y.o.   MRN: [id number]  HPI: Maria [name] is a 32 y.o. female  Chief Complaint  Patient presents with   Chest Pain    See in ER   Anxiety    Patient think new onset of panic attack   Spasms   Headache   Chest pain/shortness of breath/anxiety: patient reports she was seen in er for same on 06/01/2023.note from er : Lab work reassuring.  Creatinine at baseline.  No significant electrolyte abnormalities.  Patient without concern for pregnancy.  Troponin is undetectable.  Low risk heart score, do not feel that serial troponins are necessary at this time.Low risk Wells criteria, PERC negative, have a low suspicion for pulmonary embolism. Clinical picture is most consistent with stress anxiety, does state that she has a follow-up appointment with a therapist this next week.  Does state that she has significant amount of stress in her life.  Given return precautions for any ongoing or worsening symptoms.  Discussed close follow-up with primary care physician.  Discussed decreasing caffeine intake and possible Holter monitor.  Discussed if she had return of symptoms to return to the emergency department.  No questions at time of discharge. Today patient reports : she continues to have chest pain, palpitations, tingling all over, hot flashes, elevated blood pressure, headaches, she reports all these symptoms started at the same time.  Discussed likely caused to anxiety recommend starting medication for anxiety but patient declined at this time.    Light headed/blurred vision/headache: she says this has been going off and on for a long time.   She says they happen at random times.  She says that she has taken ibuprofen, tylenol and it does help.  Will place  referral to neurology.       06/07/2023   11:18 AM 03/08/2023    9:07 AM 01/18/2023    9:44 AM  Depression screen PHQ 2/9  Decreased Interest 0 0 0  Down, Depressed, Hopeless 0 0 0  PHQ - 2 Score 0 0 0  Altered sleeping 3    Tired, decreased energy 0    Change in appetite 0    Feeling bad or failure about yourself  0    Trouble concentrating 2    Moving slowly or fidgety/restless 0    Suicidal thoughts 0    PHQ-9 Score 5    Difficult doing work/chores Somewhat difficult         06/07/2023   11:18 AM 01/18/2023   10:52 AM  GAD 7 : Generalized Anxiety Score  Nervous, Anxious, on Edge 3 2  Control/stop worrying 3 2  Worry too much - different things 3 2  Trouble relaxing 3 2  Restless 3 2  Easily annoyed or irritable 3 2  Afraid - awful might happen 3 2  Total GAD 7 Score 21 14  Anxiety Difficulty Very difficult Somewhat difficult      Relevant past medical, surgical, family and social history reviewed and updated as indicated. Interim medical history since our last visit reviewed. Allergies and medications reviewed and updated.  Review of Systems  Ten systems reviewed and is  negative except as mentioned in HPI       Objective:    BP 112/76   Pulse 96   Temp 97.8 F (36.6 C) (Oral)   Resp 18   Ht 5\' 2"  (1.575 m)   Wt 154 lb 12.8 oz (70.2 kg)   SpO2 99%   BMI 28.31 kg/m   Wt Readings from Last 3 Encounters:  06/07/23 154 lb 12.8 oz (70.2 kg)  06/07/23 154 lb 9.6 oz (70.1 kg)  06/01/23 158 lb (71.7 kg)    Physical Exam  Constitutional: Patient appears well-developed and well-nourished.  No distress.  HEENT: head atraumatic, normocephalic, pupils equal and reactive to light, neck supple Cardiovascular: Normal rate, regular rhythm and normal heart sounds.  No murmur heard. No BLE edema. Pulmonary/Chest: Effort normal and breath sounds normal. No respiratory distress. Abdominal: Soft.  There is no tenderness. Psychiatric: Patient has a normal mood and  affect. behavior is normal. Judgment and thought content normal.  Results for orders placed or performed during the hospital encounter of 06/01/23  Basic metabolic panel  Result Value Ref Range   Sodium 138 135 - 145 mmol/L   Potassium 4.1 3.5 - 5.1 mmol/L   Chloride 105 98 - 111 mmol/L   CO2 22 22 - 32 mmol/L   Glucose, Bld 94 70 - 99 mg/dL   BUN 10 6 - 20 mg/dL   Creatinine, Ser 1.61 (H) 0.44 - 1.00 mg/dL   Calcium 9.0 8.9 - 09.6 mg/dL   GFR, Estimated >04 >54 mL/min   Anion gap 11 5 - 15  CBC  Result Value Ref Range   WBC 5.2 4.0 - 10.5 K/uL   RBC 4.47 3.87 - 5.11 MIL/uL   Hemoglobin 13.8 12.0 - 15.0 g/dL   HCT 09.8 11.9 - 14.7 %   MCV 93.1 80.0 - 100.0 fL   MCH 30.9 26.0 - 34.0 pg   MCHC 33.2 30.0 - 36.0 g/dL   RDW 82.9 56.2 - 13.0 %   Platelets 230 150 - 400 K/uL   nRBC 0.0 0.0 - 0.2 %  Troponin I (High Sensitivity)  Result Value Ref Range   Troponin I (High Sensitivity) <2 <18 ng/L      Assessment & Plan:   Problem List Items Addressed This Visit   None Visit Diagnoses     Frequent headaches    -  Primary   referral placed to neurology   Relevant Orders   TSH   Ambulatory referral to Neurology   Dizziness       referral placed to neurology   Relevant Orders   TSH   Ambulatory referral to Neurology   Blurred vision       referral placed to neurology   Relevant Orders   TSH   Ambulatory referral to Neurology   Tingling in extremities       referral placed to neurology, will check b12   Relevant Orders   Vitamin B12   TSH   Ambulatory referral to Neurology   Palpitations       zio patch ordered, tsh   Relevant Orders   TSH   LONG TERM MONITOR (3-14 DAYS)   Chest pain, unspecified type       zio patch ordered, tsh   Relevant Orders   TSH   LONG TERM MONITOR (3-14 DAYS)   Shortness of breath       zio patch ordered, tsh   Relevant Orders   TSH   LONG TERM  MONITOR (3-14 DAYS)   Hot flashes       zio patch ordered, tsh   Relevant Orders   TSH         Follow up plan: Return if symptoms worsen or fail to improve.

## 2023-06-08 LAB — VITAMIN B12: Vitamin B-12: 324 pg/mL (ref 200–1100)

## 2023-06-08 LAB — TSH: TSH: 1.17 m[IU]/L

## 2023-06-11 ENCOUNTER — Other Ambulatory Visit: Payer: Self-pay | Admitting: Gastroenterology

## 2023-06-11 NOTE — Telephone Encounter (Signed)
Last office visit 06/07/2023 Plan: 2.  GERD             Continue pantoprazole 40 Mg 1 tablet twice daily.             Recommend Lifestyle Modifications to prevent Acid Reflux.  Rec. Avoid coffee, sodas, peppermint, citrus fruits, and spicey foods.  Avoid eating 2-3 hours before bedtime.  Last refill 05/28/2023 60

## 2023-06-26 ENCOUNTER — Other Ambulatory Visit: Payer: Self-pay | Admitting: Dermatology

## 2023-06-26 DIAGNOSIS — L700 Acne vulgaris: Secondary | ICD-10-CM

## 2023-06-28 DIAGNOSIS — R079 Chest pain, unspecified: Secondary | ICD-10-CM | POA: Diagnosis not present

## 2023-06-28 DIAGNOSIS — R0602 Shortness of breath: Secondary | ICD-10-CM | POA: Diagnosis not present

## 2023-06-28 DIAGNOSIS — R002 Palpitations: Secondary | ICD-10-CM

## 2023-07-12 ENCOUNTER — Other Ambulatory Visit (HOSPITAL_COMMUNITY)
Admission: RE | Admit: 2023-07-12 | Discharge: 2023-07-12 | Disposition: A | Discharge status: Home / Self Care | Payer: Medicaid Other | Source: Ambulatory Visit | Attending: Obstetrics and Gynecology | Admitting: Obstetrics and Gynecology

## 2023-07-12 ENCOUNTER — Encounter: Payer: Self-pay | Admitting: Obstetrics and Gynecology

## 2023-07-12 ENCOUNTER — Ambulatory Visit: Payer: Medicaid Other | Admitting: Obstetrics and Gynecology

## 2023-07-12 VITALS — BP 112/71 | HR 85 | Ht 61.5 in | Wt 150.2 lb

## 2023-07-12 DIAGNOSIS — N9489 Other specified conditions associated with female genital organs and menstrual cycle: Secondary | ICD-10-CM | POA: Insufficient documentation

## 2023-07-12 DIAGNOSIS — R350 Frequency of micturition: Secondary | ICD-10-CM | POA: Diagnosis not present

## 2023-07-12 DIAGNOSIS — N3010 Interstitial cystitis (chronic) without hematuria: Secondary | ICD-10-CM | POA: Diagnosis not present

## 2023-07-12 DIAGNOSIS — M6289 Other specified disorders of muscle: Secondary | ICD-10-CM

## 2023-07-12 DIAGNOSIS — R351 Nocturia: Secondary | ICD-10-CM

## 2023-07-12 DIAGNOSIS — R300 Dysuria: Secondary | ICD-10-CM

## 2023-07-12 LAB — POCT URINALYSIS DIPSTICK
Bilirubin, UA: NEGATIVE
Blood, UA: NEGATIVE
Glucose, UA: NEGATIVE
Ketones, UA: NEGATIVE
Leukocytes, UA: NEGATIVE
Nitrite, UA: NEGATIVE
Protein, UA: POSITIVE — AB
Spec Grav, UA: >=1.03 — AB (ref 1.010–1.025)
Urobilinogen, UA: 0.2 U/dL
pH, UA: 5 (ref 5.0–8.0)

## 2023-07-12 MED ORDER — HYDROXYZINE HCL 50 MG PO TABS
50.0000 mg | ORAL_TABLET | Freq: Every evening | ORAL | 5 refills | Status: DC
Start: 2023-07-12 — End: 2023-07-13

## 2023-07-12 MED ORDER — METHENAMINE HIPPURATE 1 G PO TABS
1.0000 g | ORAL_TABLET | Freq: Two times a day (BID) | ORAL | 2 refills | Status: DC
Start: 2023-07-12 — End: 2023-08-30

## 2023-07-12 NOTE — Patient Instructions (Addendum)
Today we talked about ways to manage bladder urgency such as altering your diet to avoid irritative beverages and foods (bladder diet) as well as attempting to decrease stress and other exacerbating factors.  You can also chew a plain Tums 1-3 times per day to make your urine less acidic, especially if you have eating/drinking acidic things.   There is a website with helpful information for people with bladder irritation, called the IC Network at https://www.ic-network.com. This website has more information about a healthy bladder diet and patient forums for support.  The Most Bothersome Foods* The Least Bothersome Foods*  Coffee - Regular & Decaf Tea - caffeinated Carbonated beverages - cola, non-colas, diet & caffeine-free Alcohols - Beer, Red Wine, White Wine, [address] - Grapefruit, [place], Orange, [employer] - Cranberry, Grapefruit, Orange, Pineapple Vegetables - Tomato & Tomato Products Flavor Enhancers - Hot peppers, Spicy foods, Chili, Horseradish, Vinegar, Monosodium glutamate (MSG) Artificial Sweeteners - NutraSweet, Sweet 'N Low, Equal (sweetener), Saccharin Ethnic foods - [place], [place], [place] food [employer] - low-fat & whole Fruits - Bananas, Blueberries, Honeydew melon, Pears, Raisins, Watermelon Vegetables - Broccoli, [address] Sprouts, [place], Carrots, Cauliflower, [place], Cucumber, Mushrooms, Peas, Radishes, Squash, Zucchini, White potatoes, Sweet potatoes & yams Poultry - Chicken, Eggs, [place], [employer] - Beef, Diplomatic Services operational officer, Lamb Seafood - Shrimp, [place] fish, Salmon Grains - Oat, Rice Snacks - Pretzels, Popcorn  *Lenward Chancellor et al. Diet and its role in interstitial cystitis/bladder pain syndrome (IC/BPS) and comorbid conditions. [employer]. BJU Int. 2012 Jan 11.    Please use coconut oil or vitamin E cream in the vagina. If you want me to sent the cream to the compounding pharmacy please let me know. Aloe vera can also be helpful.   Start the Methenamine  twice a day for ant-infection.   Start the Hydorxyzine nightly to decrease the irritation in the bladder.

## 2023-07-12 NOTE — Progress Notes (Signed)
[facility] New Patient Evaluation and Consultation  Referring Provider: [facility] * PCP: Berniece Salines, FNP Date of Service: 07/12/2023  SUBJECTIVE Chief Complaint: New Patient (Initial Visit) Maria [name] is a 32 y.o. female is here for UTI's and urinary retention.)  History of Present Illness: Maria [name] is a 32 y.o. White or Caucasian female seen in consultation at the request of Dr. He[facility] for evaluation of recurrent UTI.    Review of records significant for: GAD-7 score was 21 with PCP in October Has Sleep apnea but does not wear CPAP  Urinary Symptoms: Does not leak urine.    Day time voids >10.  Nocturia: 3-5 times per night to void. Voiding dysfunction:  does not empty bladder well.  Patient does not use a catheter to empty bladder.  When urinating, patient feels a weak stream, difficulty starting urine stream, the need to urinate multiple times in a row, and to push on her belly or vagina to empty bladder Drinks: Water per day  UTIs: unknown UTI's in the last year.   Reports history of blood in urine No results found for the last 90 days.   Pelvic Organ Prolapse Symptoms:                  Patient Admits to a feeling of a bulge the vaginal area. It has been present for 5 months.  Patient Admits to seeing a bulge.  This bulge is bothersome.  Bowel Symptom: Bowel movements: 1 time(s) per day Stool consistency: soft  or loose Straining: yes.  Splinting: no.  Incomplete evacuation: no.  Patient Denies accidental bowel leakage / fecal incontinence Bowel regimen:  Linzess     Sexual Function Sexually active: yes.  Sexual orientation: Straight Pain with sex: Yes, deep in the pelvis, has discomfort due to dryness  Pelvic Pain Admits to pelvic pain Location: Around the bladder Pain occurs: All the time Prior pain treatment: None Improved by: Ice packs Worsened by: Urination    Past  Medical History:  Past Medical History:  Diagnosis Date   Acute pancreatitis    Asthma    GERD (gastroesophageal reflux disease)    Hemorrhoid    Hx of varicella    Ovarian cyst    Recurrent boils    legs and buttocks.  staff infection, was neg   Thrombocytosis 06/09/2016   UTI (urinary tract infection)      Past Surgical History:   Past Surgical History:  Procedure Laterality Date   BACK SURGERY     CESAREAN SECTION N/A 05/25/2016   Procedure: CESAREAN SECTION;  Surgeon: Gerald Leitz, MD;  Location: [facility] BIRTHING SUITES;  Service: Obstetrics;  Laterality: N/A;   FLEXIBLE SIGMOIDOSCOPY N/A 11/30/2021   Procedure: FLEXIBLE SIGMOIDOSCOPY;  Surgeon: Toney Reil, MD;  Location: [facility] ENDOSCOPY;  Service: Gastroenterology;  Laterality: N/A;   TONSILLECTOMY     WISDOM TOOTH EXTRACTION       Past OB/GYN History: G1 P1 G1P1001 Vaginal deliveries: 0,  Forceps/ Vacuum deliveries: 0, Cesarean section: 1 Menopausal: No, LMP No LMP recorded. Patient has had an injection. Contraception: Depo. Last pap smear was 2023.  Any history of abnormal pap smears: yes.  Medications: Patient has a current medication list which includes the following prescription(s): albuterol, cetirizine, clindamycin, cyclobenzaprine, hydroxyzine, ketoconazole, linaclotide, medroxyprogesterone acetate, methenamine, montelukast, ondansetron, and pantoprazole.   Allergies: Patient is allergic to bactrim ds [sulfamethoxazole-trimethoprim], cipro [ciprofloxacin hcl], amoxicillin, and penicillins.   Social History:  Social History   Tobacco Use   Smoking status: Former    Current packs/day: 0.00    Types: Cigarettes    Quit date: 11/07/2013    Years since quitting: 9.6   Smokeless tobacco: Former  Building services engineer status: Never Used  Substance Use Topics   Alcohol use: Yes    Alcohol/week: 1.0 standard drink of alcohol    Types: 1 Cans of beer per week    Comment: occasional not while preg   Drug use:  No    Relationship status: single Patient lives with with her child.   Patient is not employed. Regular exercise: Yes:   History of abuse: No  Family History:   Family History  Problem Relation Age of Onset   Hypertension Maternal Grandmother    Heart disease Maternal Grandfather    Stroke Maternal Grandfather    Diabetes Neg Hx    Cancer Neg Hx      Review of Systems: Review of Systems  Constitutional:  Negative for chills and fever.  Respiratory:  Negative for cough and shortness of breath.   Cardiovascular:  Negative for chest pain and palpitations.  Gastrointestinal:  Negative for abdominal pain, blood in stool, constipation and diarrhea.  Genitourinary:  Positive for dysuria.       Vaginal burning  Musculoskeletal:  Positive for back pain.  Skin:  Negative for rash.  Neurological:  Positive for weakness and headaches.  Psychiatric/Behavioral:  Negative for depression and suicidal ideas.      OBJECTIVE Physical Exam: Vitals:   07/12/23 0936  BP: 112/71  Pulse: 85  Weight: 150 lb 3.2 oz (68.1 kg)  Height: 5' 1.5" (1.562 m)    Physical Exam Vitals reviewed. Exam conducted with a chaperone present.  Constitutional:      Appearance: Normal appearance.  Pulmonary:     Effort: Pulmonary effort is normal.  Abdominal:     Palpations: Abdomen is soft.  Skin:    General: Skin is warm and dry.  Neurological:     General: No focal deficit present.     Mental Status: She is alert and oriented to person, place, and time.  Psychiatric:        Attention and Perception: Attention and perception normal.        Mood and Affect: Affect normal. Mood is anxious.        Speech: Speech is rapid and pressured.        Behavior: Behavior normal. Behavior is cooperative.        Thought Content: Thought content normal.        Cognition and Memory: Cognition and memory normal.        Judgment: Judgment normal.      GU / Detailed Urogynecologic Evaluation:  Pelvic Exam:  Normal external female genitalia; Bartholin's and Skene's glands normal in appearance; urethral meatus normal in appearance, no urethral masses or discharge.   CST: negative   Speculum exam reveals red and irritated vaginal mucosa without atrophy. Cervix normal appearance. Uterus normal single, nontender. Adnexa normal adnexa.     With apex supported, anterior compartment defect was reduced  Pelvic floor strength II/V  Pelvic floor musculature: Right levator non-tender, Right obturator tender, Left levator non-tender, Left obturator tender  POP-Q:   POP-Q  -2  Aa   -2                                           Ba  -5                                              C   3.5                                            Gh  4                                            Pb  8.5                                            tvl   -2                                            Ap  -2                                            Bp  -6                                              D      Rectal Exam:  Normal external exam  Post-Void Residual (PVR) by Bladder Scan: In order to evaluate bladder emptying, we discussed obtaining a postvoid residual and patient agreed to this procedure.  Procedure: The ultrasound unit was placed on the patient's abdomen in the suprapubic region after the patient had voided.    Post Void Residual - 07/12/23 0954       Post Void Residual   Post Void Residual 31 mL              Laboratory Results: Lab Results  Component Value Date   COLORU yellow 07/12/2023   CLARITYU clear 07/12/2023   GLUCOSEUR Negative 07/12/2023   BILIRUBINUR negative 07/12/2023   KETONESU negative 07/12/2023   SPECGRAV >=1.030 (A) 07/12/2023   RBCUR negative 07/12/2023   PHUR 5.0 07/12/2023   PROTEINUR Positive (A) 07/12/2023   UROBILINOGEN 0.2 07/12/2023   LEUKOCYTESUR Negative 07/12/2023    Lab Results  Component Value  Date   CREATININE 1.06 (H) 06/01/2023   CREATININE 1.04 (H) 01/18/2023   CREATININE 0.99 12/27/2022    Lab Results  Component Value Date   HGBA1C 5.3 01/18/2023    Lab Results  Component Value Date   HGB 13.8 06/01/2023     ASSESSMENT AND PLAN Ms. Bladen is a  32 y.o. with:  1. IC (interstitial cystitis)   2. Urinary frequency   3. High-tone pelvic floor dysfunction   4. Vaginal burning   5. Dysuria    Patient has hallmark signs of IC; Burning urination with lack of +cultures, painful intercourse and pelvic floor dysfunction, and frequency of urination. She has taken AZO without relief. Will start her on Hydroxyzine  in the evenings to help with the inflammation and Methenamine to prevent infectious feelings.  No sign of UTI today on exam but will send urine for pathnostics testing to rule out urethral and bladder pathogens not typically seen on traditional urine culture.  Patient has significant tension in her pelvic floor and the muscles are sensitive to palpation. Pelvic floor PT may be helpful for both this and her IC.  Patient has vaginal burning with no obvious source. Encouraged patient to use coconut oil in the vagina as she does not need estrogen cream at her age. Aptima swab also obtained to rule out underlying yeast or BV.  Discussed with patient that we can consider doing bladder installation and cystoscopy moving forward. She reports her mother had a cystoscopy and had bladder irritation and went through a "bladder scraping" and has since done better, so patient wonders if something like this would be helpful with her. Will start with preventative measures and go forward.   Patient to follow up in 3 months or sooner if needed.   Selmer Dominion, NP

## 2023-07-13 ENCOUNTER — Other Ambulatory Visit: Payer: Self-pay | Admitting: Obstetrics and Gynecology

## 2023-07-13 DIAGNOSIS — N3010 Interstitial cystitis (chronic) without hematuria: Secondary | ICD-10-CM

## 2023-07-13 LAB — CERVICOVAGINAL ANCILLARY ONLY
Bacterial Vaginitis (gardnerella): NEGATIVE
Candida Glabrata: NEGATIVE
Candida Vaginitis: NEGATIVE
Comment: NEGATIVE
Comment: NEGATIVE
Comment: NEGATIVE

## 2023-07-16 ENCOUNTER — Encounter: Payer: Self-pay | Admitting: Obstetrics and Gynecology

## 2023-07-23 NOTE — Telephone Encounter (Signed)
LM on the VM for the patient to call back and schedule her bladder instillations

## 2023-08-07 ENCOUNTER — Encounter: Payer: Self-pay | Admitting: Nurse Practitioner

## 2023-08-08 NOTE — Telephone Encounter (Signed)
Attempted to contact patient. Maria [name]

## 2023-08-17 ENCOUNTER — Ambulatory Visit (INDEPENDENT_AMBULATORY_CARE_PROVIDER_SITE_OTHER): Payer: Medicaid Other

## 2023-08-17 VITALS — BP 124/89 | HR 90

## 2023-08-17 DIAGNOSIS — R350 Frequency of micturition: Secondary | ICD-10-CM

## 2023-08-17 DIAGNOSIS — R102 Pelvic and perineal pain: Secondary | ICD-10-CM

## 2023-08-17 LAB — POCT URINALYSIS DIPSTICK
Bilirubin, UA: NEGATIVE
Blood, UA: NEGATIVE
Glucose, UA: NEGATIVE
Ketones, UA: NEGATIVE
Leukocytes, UA: NEGATIVE
Nitrite, UA: NEGATIVE
Protein, UA: NEGATIVE
Spec Grav, UA: 1.01 (ref 1.010–1.025)
Urobilinogen, UA: 0.2 U/dL
pH, UA: 6.5 (ref 5.0–8.0)

## 2023-08-17 MED ORDER — HEPARIN SODIUM (PORCINE) 10000 UNIT/ML IJ SOLN
10000.0000 [IU] | Freq: Once | INTRAMUSCULAR | Status: AC
Start: 2023-08-17 — End: 2023-08-17
  Administered 2023-08-17: 10000 [IU] via INTRAVESICAL

## 2023-08-17 MED ORDER — BUPIVACAINE HCL 0.25 % IJ SOLN
20.0000 mL | Freq: Once | INTRAMUSCULAR | Status: AC
Start: 2023-08-17 — End: 2023-08-17
  Administered 2023-08-17: 20 mL

## 2023-08-17 MED ORDER — LIDOCAINE HCL 2 % IJ SOLN
20.0000 mL | Freq: Once | INTRAMUSCULAR | Status: AC
Start: 2023-08-17 — End: 2023-08-17
  Administered 2023-08-17: 400 mg

## 2023-08-17 MED ORDER — SODIUM BICARBONATE 8.4 % IV SOLN
5.0000 mL | Freq: Once | INTRAVENOUS | Status: AC
Start: 2023-08-17 — End: 2023-08-17
  Administered 2023-08-17: 5 mL

## 2023-08-17 NOTE — Patient Instructions (Signed)
Keep all future appointments.

## 2023-08-17 NOTE — Progress Notes (Signed)
Maria [name] is a 32 y.o. female  here for a bladder instillation.  Bladder Instillation: The patient was identified and verbally consented for the procedure.  The urethra was prepped with Betadine x 3. A 14 Fr foley catheter was inserted the bladder and drained for _10_ cc. The foley was then attached to a 60 mL syringe with the plunger removed.  The medication was slowly poured into the bladder via the syringe and foley.  The medication consisted of: 20ml of Lidocaine 2%, 20mL of Bupivicaine 0.25%, 10,000 units/mL Heparin, 5mL Sodium Bicarbonate 8.4%.  The foley was removed and the patient was asked to hold the liquids in her bladder for 30-60 minutes if possible.   Precautions were given and patient was instructed to call the office or on-call number for any concerns.  Salina April, CMA

## 2023-08-26 ENCOUNTER — Emergency Department: Payer: Medicaid Other

## 2023-08-26 ENCOUNTER — Other Ambulatory Visit: Payer: Self-pay

## 2023-08-26 ENCOUNTER — Emergency Department
Admission: EM | Admit: 2023-08-26 | Discharge: 2023-08-26 | Disposition: A | Discharge status: Home / Self Care | Payer: Medicaid Other | Attending: Emergency Medicine | Admitting: Emergency Medicine

## 2023-08-26 DIAGNOSIS — R519 Headache, unspecified: Secondary | ICD-10-CM | POA: Insufficient documentation

## 2023-08-26 DIAGNOSIS — R300 Dysuria: Secondary | ICD-10-CM | POA: Diagnosis present

## 2023-08-26 DIAGNOSIS — R1011 Right upper quadrant pain: Secondary | ICD-10-CM | POA: Diagnosis not present

## 2023-08-26 LAB — COMPREHENSIVE METABOLIC PANEL
ALT: 38 U/L (ref 0–44)
AST: 30 U/L (ref 15–41)
Albumin: 4.4 g/dL (ref 3.5–5.0)
Alkaline Phosphatase: 49 U/L (ref 38–126)
Anion gap: 13 (ref 5–15)
BUN: 12 mg/dL (ref 6–20)
CO2: 22 mmol/L (ref 22–32)
Calcium: 9 mg/dL (ref 8.9–10.3)
Chloride: 103 mmol/L (ref 98–111)
Creatinine, Ser: 0.93 mg/dL (ref 0.44–1.00)
GFR, Estimated: >60 mL/min (ref >60)
Glucose, Bld: 94 mg/dL (ref 70–99)
Potassium: 3.8 mmol/L (ref 3.5–5.1)
Sodium: 138 mmol/L (ref 135–145)
Total Bilirubin: 0.7 mg/dL (ref 0.0–1.2)
Total Protein: 7 g/dL (ref 6.5–8.1)

## 2023-08-26 LAB — URINALYSIS, ROUTINE W REFLEX MICROSCOPIC
Bilirubin Urine: NEGATIVE
Glucose, UA: NEGATIVE mg/dL
Hgb urine dipstick: NEGATIVE
Ketones, ur: NEGATIVE mg/dL
Leukocytes,Ua: NEGATIVE
Nitrite: NEGATIVE
Protein, ur: NEGATIVE mg/dL
Specific Gravity, Urine: 1.024 (ref 1.005–1.030)
pH: 5 (ref 5.0–8.0)

## 2023-08-26 LAB — CBC
HCT: 41.1 % (ref 36.0–46.0)
Hemoglobin: 13.6 g/dL (ref 12.0–15.0)
MCH: 31.1 pg (ref 26.0–34.0)
MCHC: 33.1 g/dL (ref 30.0–36.0)
MCV: 93.8 fL (ref 80.0–100.0)
Platelets: 203 10*3/uL (ref 150–400)
RBC: 4.38 MIL/uL (ref 3.87–5.11)
RDW: 13.1 % (ref 11.5–15.5)
WBC: 6 10*3/uL (ref 4.0–10.5)
nRBC: 0 % (ref 0.0–0.2)

## 2023-08-26 LAB — POC URINE PREG, ED: Preg Test, Ur: NEGATIVE

## 2023-08-26 LAB — LIPASE, BLOOD: Lipase: 34 U/L (ref 11–51)

## 2023-08-26 MED ORDER — FLUCONAZOLE 200 MG PO TABS: 200.0000 mg | ORAL_TABLET | ORAL | 0 refills | Status: AC

## 2023-08-26 MED ORDER — NAPROXEN 500 MG PO TABS
500.0000 mg | ORAL_TABLET | Freq: Once | ORAL | Status: AC
  Administered 2023-08-26: 500 mg via ORAL
  Filled 2023-08-26: qty 1

## 2023-08-26 MED ORDER — METOCLOPRAMIDE HCL 10 MG PO TABS
10.0000 mg | ORAL_TABLET | Freq: Once | ORAL | Status: AC
  Administered 2023-08-26: 10 mg via ORAL
  Filled 2023-08-26: qty 1

## 2023-08-26 NOTE — ED Triage Notes (Signed)
 Pt comes with c/o belly pain. Pt states she has had several tests done bc of this pain. Pt states her bladder has dropped. Pt states no relief with any meds. Pt not sure if kidney stone. Pt states burning inside her stomach and outside.

## 2023-08-26 NOTE — Discharge Instructions (Addendum)
 Your labs, ultrasound, and CT scan were all okay. Please continue to follow up with urology for further evaluation.

## 2023-08-26 NOTE — ED Provider Notes (Signed)
 [facility] Provider Note    Event Date/Time   First MD Initiated Contact with Patient 08/26/23 1750     (approximate)   History   Chief Complaint: Abdominal Pain   HPI  Maria [name] is a 33 y.o. female with a history of GERD, pancreatitis, C-section 7 years ago who comes ED complaining of right upper quadrant abdominal pain radiating around to her back.  Also reports dysuria which has been ongoing for several weeks, gradually worsening.  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  Also has been recommended for intravesical antibiotic installations.  Patient also reports bilateral frontal headache consistent with her typical migraines.  No vision loss fever or neck pain or trauma.        Physical Exam   Triage Vital Signs: ED Triage Vitals  Encounter Vitals Group     BP 08/26/23 1526 (!) 147/96     Systolic BP Percentile --      Diastolic BP Percentile --      Pulse Rate 08/26/23 1526 76     Resp 08/26/23 1526 18     Temp 08/26/23 1526 97.7 F (36.5 C)     Temp src --      SpO2 08/26/23 1526 100 %     Weight 08/26/23 1528 150 lb (68 kg)     Height 08/26/23 1528 5' 2 (1.575 m)     Head Circumference --      Peak Flow --      Pain Score 08/26/23 1528 10     Pain Loc --      Pain Education --      Exclude from Growth Chart --     Most recent vital signs: Vitals:   08/26/23 1526  BP: (!) 147/96  Pulse: 76  Resp: 18  Temp: 97.7 F (36.5 C)  SpO2: 100%    General: Awake, no distress.  CV:  Good peripheral perfusion.  Regular rate and rhythm Resp:  Normal effort.  Clear to auscultation bilaterally Abd:  No distention.  Soft with suprapubic tenderness and right upper quadrant tenderness Other:  Moist oral mucosa.  Nontoxic   ED Results / Procedures / Treatments   Labs (all labs ordered are listed, but only abnormal results are displayed) Labs Reviewed  URINALYSIS,  ROUTINE W REFLEX MICROSCOPIC - Abnormal; Notable for the following components:      Result Value   Color, Urine YELLOW (*)    APPearance HAZY (*)    All other components within normal limits  LIPASE, BLOOD  COMPREHENSIVE METABOLIC PANEL  CBC  POC URINE PREG, ED     EKG    RADIOLOGY Ultrasound right upper quadrant interpreted by me, negative for gallstones or cholecystitis.  Radiology report reviewed.  Ultrasound renal normal.  CT abdomen pelvis unremarkable   PROCEDURES:  Procedures   MEDICATIONS ORDERED IN ED: Medications  naproxen  (NAPROSYN ) tablet 500 mg (500 mg Oral Given 08/26/23 1812)  metoCLOPramide  (REGLAN ) tablet 10 mg (10 mg Oral Given 08/26/23 1812)     IMPRESSION / MDM / ASSESSMENT AND PLAN / ED COURSE  I reviewed the triage vital signs and the nursing notes.  DDx: Cholecystitis, pancreatitis, ureterolithiasis, cystitis, pregnancy  Patient's presentation is most consistent with acute presentation with potential threat to life or bodily function.  Patient presents with right flank pain as well as suprapubic pain and dysuria.  Vital signs and labs are normal.  With tenderness on exam, will obtain right upper quadrant and renal ultrasound.  Will give naproxen  and Reglan  for migraine headache   Clinical Course as of 08/26/23 2114  [place] Aug 26, 2023  2012 US  RUQ/renal = normal. Pt has persistent R flank pain - will CT.  [PS]    Clinical Course User Index [PS] Viviann Pastor, MD    ----------------------------------------- 9:14 PM on 08/26/2023 ----------------------------------------- CT unremarkable.  Will offer trial of fluconazole  for possible Candida vaginitis, follow-up with urology.   FINAL CLINICAL IMPRESSION(S) / ED DIAGNOSES   Final diagnoses:  Dysuria     Rx / DC Orders   ED Discharge Orders          Ordered    fluconazole  (DIFLUCAN ) 200 MG tablet  Weekly        08/26/23 2110             Note:  This document was prepared  using Dragon voice recognition software and may include unintentional dictation errors.   Viviann Pastor, MD 08/26/23 2114

## 2023-08-30 ENCOUNTER — Ambulatory Visit (INDEPENDENT_AMBULATORY_CARE_PROVIDER_SITE_OTHER): Payer: Medicaid Other | Admitting: Nurse Practitioner

## 2023-08-30 ENCOUNTER — Encounter: Payer: Self-pay | Admitting: Nurse Practitioner

## 2023-08-30 VITALS — BP 132/88 | HR 94 | Temp 97.7°F | Resp 18 | Ht 62.0 in | Wt 151.4 lb

## 2023-08-30 DIAGNOSIS — I499 Cardiac arrhythmia, unspecified: Secondary | ICD-10-CM | POA: Diagnosis not present

## 2023-08-30 DIAGNOSIS — R222 Localized swelling, mass and lump, trunk: Secondary | ICD-10-CM

## 2023-08-30 DIAGNOSIS — F411 Generalized anxiety disorder: Secondary | ICD-10-CM | POA: Diagnosis not present

## 2023-08-30 NOTE — Progress Notes (Signed)
 BP 132/88   Pulse 94   Temp 97.7 F (36.5 C)   Resp 18   Ht 5' 2 (1.575 m)   Wt 151 lb 6.4 oz (68.7 kg)   SpO2 97%   BMI 27.69 kg/m    Subjective:    Patient ID: Maria [name], female    DOB: October 17, 1990, 33 y.o.   MRN: [id number]  HPI: Maria [name] is a 33 y.o. female  Chief Complaint  Patient presents with   Irregular Heart Beat    Racing skipping beats   Anxiety    Discussed the use of AI scribe software for clinical note transcription with the patient, who gave verbal consent to proceed.  History of Present Illness   The patient,  presents with increased anxiety and palpitations. They attribute the increased anxiety to their son's recent onset of seizures, which has led to increased stress and sleep disturbances. They also report a sensation of their heart racing, particularly when they are trying to sleep. They are unsure if this is due to their anxiety or a separate cardiac issue.  previous work up completed with zio patch and labs.  no change in ekg done today.    Patient then reported that she has a knot on her sternum that just popped up. She says it is tender.  Will get ultrasound. No redness or drainage noted.     08/30/2023    9:18 AM 06/07/2023   11:18 AM 03/08/2023    9:07 AM  Depression screen PHQ 2/9  Decreased Interest 0 0 0  Down, Depressed, Hopeless 0 0 0  PHQ - 2 Score 0 0 0  Altered sleeping 3 3   Tired, decreased energy 0 0   Change in appetite 0 0   Feeling bad or failure about yourself  0 0   Trouble concentrating 2 2   Moving slowly or fidgety/restless 0 0   Suicidal thoughts 0 0   PHQ-9 Score 5 5   Difficult doing work/chores Somewhat difficult Somewhat difficult        08/30/2023    9:19 AM 06/07/2023   11:18 AM 01/18/2023   10:52 AM  GAD 7 : Generalized Anxiety Score  Nervous, Anxious, on Edge 3 3 2   Control/stop worrying 3 3 2   Worry too much - different things 3 3 2   Trouble relaxing 3 3 2   Restless 3 3 2    Easily annoyed or irritable 3 3 2   Afraid - awful might happen 3 3 2   Total GAD 7 Score 21 21 14   Anxiety Difficulty Very difficult Very difficult Somewhat difficult     Relevant past medical, surgical, family and social history reviewed and updated as indicated. Interim medical history since our last visit reviewed. Allergies and medications reviewed and updated.  Review of Systems  Ten systems reviewed and is negative except as mentioned in HPI      Objective:    BP 132/88   Pulse 94   Temp 97.7 F (36.5 C)   Resp 18   Ht 5' 2 (1.575 m)   Wt 151 lb 6.4 oz (68.7 kg)   SpO2 97%   BMI 27.69 kg/m    Wt Readings from Last 3 Encounters:  08/30/23 151 lb 6.4 oz (68.7 kg)  08/26/23 150 lb (68 kg)  07/12/23 150 lb 3.2 oz (68.1 kg)    Physical Exam  Constitutional: Patient appears well-developed and well-nourished.  No distress.  HEENT: head atraumatic, normocephalic, pupils  equal and reactive to light, neck supple Cardiovascular: Normal rate, regular rhythm and normal heart sounds.  No murmur heard. No BLE edema. Pulmonary/Chest: Effort normal and breath sounds normal. No respiratory distress. Lump noted on lower sternum Abdominal: Soft.  There is no tenderness. Psychiatric: Patient has a normal mood and affect. behavior is normal. Judgment and thought content normal.  Results for orders placed or performed during the hospital encounter of 08/26/23  Lipase, blood   Collection Time: 08/26/23  3:29 PM  Result Value Ref Range   Lipase 34 11 - 51 U/L  Comprehensive metabolic panel   Collection Time: 08/26/23  3:29 PM  Result Value Ref Range   Sodium 138 135 - 145 mmol/L   Potassium 3.8 3.5 - 5.1 mmol/L   Chloride 103 98 - 111 mmol/L   CO2 22 22 - 32 mmol/L   Glucose, Bld 94 70 - 99 mg/dL   BUN 12 6 - 20 mg/dL   Creatinine, Ser 9.06 0.44 - 1.00 mg/dL   Calcium 9.0 8.9 - 89.6 mg/dL   Total Protein 7.0 6.5 - 8.1 g/dL   Albumin 4.4 3.5 - 5.0 g/dL   AST 30 15 - 41 U/L    ALT 38 0 - 44 U/L   Alkaline Phosphatase 49 38 - 126 U/L   Total Bilirubin 0.7 0.0 - 1.2 mg/dL   GFR, Estimated >39 >39 mL/min   Anion gap 13 5 - 15  CBC   Collection Time: 08/26/23  3:29 PM  Result Value Ref Range   WBC 6.0 4.0 - 10.5 K/uL   RBC 4.38 3.87 - 5.11 MIL/uL   Hemoglobin 13.6 12.0 - 15.0 g/dL   HCT 58.8 63.9 - 53.9 %   MCV 93.8 80.0 - 100.0 fL   MCH 31.1 26.0 - 34.0 pg   MCHC 33.1 30.0 - 36.0 g/dL   RDW 86.8 88.4 - 84.4 %   Platelets 203 150 - 400 K/uL   nRBC 0.0 0.0 - 0.2 %  Urinalysis, Routine w reflex microscopic -Urine, Random   Collection Time: 08/26/23  3:30 PM  Result Value Ref Range   Color, Urine YELLOW (A) YELLOW   APPearance HAZY (A) CLEAR   Specific Gravity, Urine 1.024 1.005 - 1.030   pH 5.0 5.0 - 8.0   Glucose, UA NEGATIVE NEGATIVE mg/dL   Hgb urine dipstick NEGATIVE NEGATIVE   Bilirubin Urine NEGATIVE NEGATIVE   Ketones, ur NEGATIVE NEGATIVE mg/dL   Protein, ur NEGATIVE NEGATIVE mg/dL   Nitrite NEGATIVE NEGATIVE   Leukocytes,Ua NEGATIVE NEGATIVE  POC urine preg, ED   Collection Time: 08/26/23  3:38 PM  Result Value Ref Range   Preg Test, Ur NEGATIVE NEGATIVE       Assessment & Plan:   Problem List Items Addressed This Visit       Other   History of ADHD   GAD (generalized anxiety disorder)   Other Visit Diagnoses       Irregular heart beat    -  Primary   Relevant Orders   EKG 12-Lead   Ambulatory referral to Cardiology     Lump in chest       Relevant Orders   US  CHEST SOFT TISSUE        Assessment and Plan    Anxiety Increased stress and anxiety due to son's recent seizures. Patient has not been on any anxiety medication before and expresses concerns about potential side effects and addiction. She plans to discuss this with her  psychologist. -Referral to cardiology for further evaluation and to ease patient's anxiety about her heart.   Palpitations Patient reports episodes of heart racing, particularly during periods of  stress and anxiety. EKG today was normal, as was the previous Zio patch monitoring. -Referral to cardiology for further evaluation.  Insomnia Patient reports difficulty sleeping due to stress, anxiety, and physical discomfort. She has been using a heating pad for her IBS symptoms and is considering taking magnesium  to help with sleep. -Encourage consistent sleep hygiene practices and consider the use of magnesium  as a natural sleep aid.       Lump lower sternum- get soft tissue ultrasound.  Follow up plan: Return if symptoms worsen or fail to improve.

## 2023-09-03 ENCOUNTER — Ambulatory Visit
Admission: RE | Admit: 2023-09-03 | Discharge: 2023-09-03 | Disposition: A | Discharge status: Home / Self Care | Payer: Medicaid Other | Source: Ambulatory Visit | Attending: Nurse Practitioner | Admitting: Nurse Practitioner

## 2023-09-03 DIAGNOSIS — R222 Localized swelling, mass and lump, trunk: Secondary | ICD-10-CM | POA: Diagnosis present

## 2023-09-10 ENCOUNTER — Encounter: Payer: Self-pay | Admitting: Nurse Practitioner

## 2023-09-10 ENCOUNTER — Ambulatory Visit: Payer: Medicaid Other | Admitting: Nurse Practitioner

## 2023-09-10 ENCOUNTER — Other Ambulatory Visit: Payer: Self-pay

## 2023-09-10 VITALS — BP 120/80 | HR 93 | Temp 98.0°F | Resp 16 | Ht 62.0 in | Wt 151.7 lb

## 2023-09-10 DIAGNOSIS — F411 Generalized anxiety disorder: Secondary | ICD-10-CM | POA: Diagnosis not present

## 2023-09-10 DIAGNOSIS — K219 Gastro-esophageal reflux disease without esophagitis: Secondary | ICD-10-CM | POA: Diagnosis not present

## 2023-09-10 MED ORDER — ESOMEPRAZOLE MAGNESIUM 40 MG PO CPDR
40.0000 mg | DELAYED_RELEASE_CAPSULE | Freq: Every day | ORAL | 3 refills | Status: DC
Start: 2023-09-10 — End: 2023-12-06

## 2023-09-10 MED ORDER — ESCITALOPRAM OXALATE 10 MG PO TABS: 10.0000 mg | ORAL_TABLET | Freq: Every day | ORAL | 0 refills | Status: DC

## 2023-09-10 NOTE — Progress Notes (Signed)
BP 120/80 (Cuff Size: Large)   Pulse 93   Temp 98 F (36.7 C) (Oral)   Resp 16   Ht 5\' 2"  (1.575 m)   Wt 151 lb 11.2 oz (68.8 kg)   BMI 27.75 kg/m    Subjective:    Patient ID: Maria [name], female    DOB: 1990/10/12, 33 y.o.   MRN: [id number]  HPI: Maria [name] is a 33 y.o. female  Chief Complaint  Patient presents with   Anxiety   Cyst    In center of chest   Gastroesophageal Reflux    Discussed the use of AI scribe software for clinical note transcription with the patient, who gave verbal consent to proceed.  History of Present Illness   The patient, with a history of GERD, anxiety, back pain, and eczema, presents with multiple concerns. The primary concern is her increasing anxiety, which she attributes to her son's recent seizures and her own health issues. She reports severe migraines, heart palpitations, and a sensation of brain swelling during anxiety episodes. She also experiences physical symptoms such as chest tightness, tingling in her limbs, and difficulty breathing.  The patient also reports a cyst on her chest, which has been causing discomfort. An ultrasound was performed, but the results were unremarkable.  She has been experiencing worsening GERD symptoms, which she has been self-treating with Nexium, as her prescribed pantoprazole was not providing sufficient relief.  The patient also reports back pain, which she believes is due to a pinched nerve. She has a history of back surgery and has been in physical therapy multiple times. She is considering contacting her back doctor for further evaluation.  Lastly, the patient mentions that she has eczema, which has been causing itching and discomfort. She has been using a compounded medication, which she reports has been helpful.       09/10/2023   10:07 AM 08/30/2023    9:18 AM 06/07/2023   11:18 AM  Depression screen PHQ 2/9  Decreased Interest 1 0 0  Down, Depressed, Hopeless 0 0 0  PHQ -  2 Score 1 0 0  Altered sleeping 3 3 3   Tired, decreased energy 1 0 0  Change in appetite 1 0 0  Feeling bad or failure about yourself  0 0 0  Trouble concentrating 0 2 2  Moving slowly or fidgety/restless 0 0 0  Suicidal thoughts 0 0 0  PHQ-9 Score 6 5 5   Difficult doing work/chores Somewhat difficult Somewhat difficult Somewhat difficult       09/10/2023   10:08 AM 08/30/2023    9:19 AM 06/07/2023   11:18 AM 01/18/2023   10:52 AM  GAD 7 : Generalized Anxiety Score  Nervous, Anxious, on Edge 1 3 3 2   Control/stop worrying 1 3 3 2   Worry too much - different things 1 3 3 2   Trouble relaxing 1 3 3 2   Restless 1 3 3 2   Easily annoyed or irritable 0 3 3 2   Afraid - awful might happen 1 3 3 2   Total GAD 7 Score 6 21 21 14   Anxiety Difficulty Somewhat difficult Very difficult Very difficult Somewhat difficult     Relevant past medical, surgical, family and social history reviewed and updated as indicated. Interim medical history since our last visit reviewed. Allergies and medications reviewed and updated.  Review of Systems  Constitutional: Negative for fever or weight change.  Respiratory: Negative for cough and shortness of breath.   Cardiovascular:  Negative for chest pain or palpitations.  Gastrointestinal: Negative for abdominal pain, no bowel changes.  Musculoskeletal: Negative for gait problem or joint swelling.  Skin: Negative for rash.  Neurological: Negative for dizziness or headache.  No other specific complaints in a complete review of systems (except as listed in HPI above).      Objective:    BP 120/80 (Cuff Size: Large)   Pulse 93   Temp 98 F (36.7 C) (Oral)   Resp 16   Ht 5\' 2"  (1.575 m)   Wt 151 lb 11.2 oz (68.8 kg)   BMI 27.75 kg/m    Wt Readings from Last 3 Encounters:  09/10/23 151 lb 11.2 oz (68.8 kg)  08/30/23 151 lb 6.4 oz (68.7 kg)  08/26/23 150 lb (68 kg)    Physical Exam  Constitutional: Patient appears well-developed and well-nourished.  No distress.  HEENT: head atraumatic, normocephalic, pupils equal and reactive to light, neck supple Cardiovascular: Normal rate, regular rhythm and normal heart sounds.  No murmur heard. No BLE edema. Pulmonary/Chest: Effort normal and breath sounds normal. No respiratory distress. Abdominal: Soft.  There is no tenderness. Psychiatric: Patient has a normal mood and affect. behavior is normal. Judgment and thought content normal.  Results for orders placed or performed during the hospital encounter of 08/26/23  Lipase, blood   Collection Time: 08/26/23  3:29 PM  Result Value Ref Range   Lipase 34 11 - 51 U/L  Comprehensive metabolic panel   Collection Time: 08/26/23  3:29 PM  Result Value Ref Range   Sodium 138 135 - 145 mmol/L   Potassium 3.8 3.5 - 5.1 mmol/L   Chloride 103 98 - 111 mmol/L   CO2 22 22 - 32 mmol/L   Glucose, Bld 94 70 - 99 mg/dL   BUN 12 6 - 20 mg/dL   Creatinine, Ser 7.82 0.44 - 1.00 mg/dL   Calcium 9.0 8.9 - 95.6 mg/dL   Total Protein 7.0 6.5 - 8.1 g/dL   Albumin 4.4 3.5 - 5.0 g/dL   AST 30 15 - 41 U/L   ALT 38 0 - 44 U/L   Alkaline Phosphatase 49 38 - 126 U/L   Total Bilirubin 0.7 0.0 - 1.2 mg/dL   GFR, Estimated >21 >30 mL/min   Anion gap 13 5 - 15  CBC   Collection Time: 08/26/23  3:29 PM  Result Value Ref Range   WBC 6.0 4.0 - 10.5 K/uL   RBC 4.38 3.87 - 5.11 MIL/uL   Hemoglobin 13.6 12.0 - 15.0 g/dL   HCT 86.5 78.4 - 69.6 %   MCV 93.8 80.0 - 100.0 fL   MCH 31.1 26.0 - 34.0 pg   MCHC 33.1 30.0 - 36.0 g/dL   RDW 29.5 28.4 - 13.2 %   Platelets 203 150 - 400 K/uL   nRBC 0.0 0.0 - 0.2 %  Urinalysis, Routine w reflex microscopic -Urine, Random   Collection Time: 08/26/23  3:30 PM  Result Value Ref Range   Color, Urine YELLOW (A) YELLOW   APPearance HAZY (A) CLEAR   Specific Gravity, Urine 1.024 1.005 - 1.030   pH 5.0 5.0 - 8.0   Glucose, UA NEGATIVE NEGATIVE mg/dL   Hgb urine dipstick NEGATIVE NEGATIVE   Bilirubin Urine NEGATIVE NEGATIVE    Ketones, ur NEGATIVE NEGATIVE mg/dL   Protein, ur NEGATIVE NEGATIVE mg/dL   Nitrite NEGATIVE NEGATIVE   Leukocytes,Ua NEGATIVE NEGATIVE  POC urine preg, ED   Collection Time: 08/26/23  3:38  PM  Result Value Ref Range   Preg Test, Ur NEGATIVE NEGATIVE       Assessment & Plan:   Problem List Items Addressed This Visit       Digestive   Gastroesophageal reflux disease without esophagitis - Primary   Relevant Medications   esomeprazole (NEXIUM) 40 MG capsule     Other   GAD (generalized anxiety disorder)   Relevant Medications   escitalopram (LEXAPRO) 10 MG tablet     Assessment and Plan    Chest Cyst Unremarkable ultrasound. Patient reports discomfort and swelling. -do not feel the cyst at this time, she has been placing ice on it  Anxiety Severe anxiety with physical manifestations including chest tightness, tingling in extremities, headaches, and palpitations. Patient's anxiety is exacerbated by her son's health condition (seizures). -Start Lexapro at a low dose, taken daily. -Follow-up in 4 weeks to assess response to medication.  Gastroesophageal Reflux Disease (GERD) Patient reports worsening symptoms, discomfort from a "knot" in her chest, and has stopped taking pantoprazole due to concerns about its effects. Nexium provides some relief. -Continue Nexium for symptom management.  Back Pain/Pinched Nerve History of back surgery with ongoing issues including swelling, inflammation, and suspected pinched nerve causing pain and pulsating sensation. Patient also reports a knot in her back, possibly related to the pinched nerve. -Contact back doctor for further evaluation and management.  Allergies/Eczema Patient reports severe itching and has been advised to take cetirizine and Singulair together. Eczema present on face and hands. -Continue cetirizine and Singulair together for comprehensive allergy management.  Cardiology Follow-up Patient has an upcoming appointment  with cardiology. -Continue with scheduled cardiology appointment.        Follow up plan: Return in about 4 weeks (around 10/08/2023) for follow up.

## 2023-09-13 ENCOUNTER — Ambulatory Visit: Payer: Medicaid Other | Admitting: Obstetrics and Gynecology

## 2023-09-17 ENCOUNTER — Ambulatory Visit (INDEPENDENT_AMBULATORY_CARE_PROVIDER_SITE_OTHER): Payer: Medicaid Other | Admitting: Obstetrics and Gynecology

## 2023-09-17 ENCOUNTER — Encounter: Payer: Self-pay | Admitting: Obstetrics and Gynecology

## 2023-09-17 VITALS — BP 122/83 | HR 95

## 2023-09-17 DIAGNOSIS — N3010 Interstitial cystitis (chronic) without hematuria: Secondary | ICD-10-CM | POA: Diagnosis not present

## 2023-09-17 DIAGNOSIS — R350 Frequency of micturition: Secondary | ICD-10-CM

## 2023-09-17 LAB — POCT URINALYSIS DIPSTICK
Bilirubin, UA: NEGATIVE
Blood, UA: NEGATIVE
Glucose, UA: NEGATIVE
Ketones, UA: NEGATIVE
Leukocytes, UA: NEGATIVE
Nitrite, UA: NEGATIVE
Protein, UA: NEGATIVE
Spec Grav, UA: 1.015 (ref 1.010–1.025)
Urobilinogen, UA: 0.2 U/dL
pH, UA: 6 (ref 5.0–8.0)

## 2023-09-17 MED ORDER — BUPIVACAINE HCL 0.25 % IJ SOLN
20.0000 mL | Freq: Once | INTRAMUSCULAR | Status: AC
  Administered 2023-09-17: 20 mL

## 2023-09-17 MED ORDER — SODIUM BICARBONATE 8.4 % IV SOLN
5.0000 mL | Freq: Once | INTRAVENOUS | Status: AC
  Administered 2023-09-17: 5 mL

## 2023-09-17 MED ORDER — HEPARIN SODIUM (PORCINE) 10000 UNIT/ML IJ SOLN
10000.0000 [IU] | Freq: Once | INTRAMUSCULAR | Status: AC
  Administered 2023-09-17: 10000 [IU] via INTRAVESICAL

## 2023-09-17 MED ORDER — LIDOCAINE HCL 2 % IJ SOLN
20.0000 mL | Freq: Once | INTRAMUSCULAR | Status: AC
  Administered 2023-09-17: 400 mg

## 2023-09-17 NOTE — Patient Instructions (Addendum)
I want you to come back for a bladder installation next week and the week after then take a break if you want. We want to do a few in a row to keep this under control.   Continue trying to reduce your caffeine and acid intake.   Today we talked about ways to manage bladder urgency such as altering your diet to avoid irritative beverages and foods (bladder diet) as well as attempting to decrease stress and other exacerbating factors.  You can also chew a plain Tums 1-3 times per day to make your urine less acidic, especially if you have eating/drinking acidic things.   There is a website with helpful information for people with bladder irritation, called the IC Network at https://www.ic-network.com. This website has more information about a healthy bladder diet and patient forums for support.  The Most Bothersome Foods* The Least Bothersome Foods*  Coffee - Regular & Decaf Tea - caffeinated Carbonated beverages - cola, non-colas, diet & caffeine-free Alcohols - Beer, Red Wine, White Wine, [address] - Grapefruit, Bug Tussle, Orange, [employer] - Cranberry, Grapefruit, Orange, Pineapple Vegetables - Tomato & Tomato Products Flavor Enhancers - Hot peppers, Spicy foods, Chili, Horseradish, Vinegar, Monosodium glutamate (MSG) Artificial Sweeteners - NutraSweet, Sweet 'N Low, Equal (sweetener), Saccharin Ethnic foods - [place], [place], [place] food [employer] - low-fat & whole Fruits - Bananas, Blueberries, Honeydew melon, Pears, Raisins, Watermelon Vegetables - Broccoli, [address] Sprouts, [place], Carrots, Cauliflower, [place], Cucumber, Mushrooms, Peas, Radishes, Squash, Zucchini, White potatoes, Sweet potatoes & yams Poultry - Chicken, Eggs, [place], [employer] - Beef, Diplomatic Services operational officer, Lamb Seafood - Shrimp, [place] fish, Salmon Grains - Oat, Rice Snacks - Pretzels, Popcorn  *Lenward Chancellor et al. Diet and its role in interstitial cystitis/bladder pain syndrome (IC/BPS) and comorbid conditions. [employer]. [employer] Int. 2012 Jan 11.

## 2023-09-17 NOTE — Progress Notes (Signed)
[facility] Return Visit  SUBJECTIVE  History of Present Illness: Maria [name] is a 33 y.o. female seen in follow-up for IC and vaginal/urethral burning. Plan at last visit was to do bladder installations and had a control bladder irritants.   Patient has been trying to control her bladder irritation but has also had significant stressors including the fact that her son has been having seizures again.  When her son has seizures they typically happen in the evening and she has fear of sleeping through him having a seizure and something bad happening.  Her son is nonverbal and autistic and she has a lot of caregiver stress related to this.  Past Medical History: Patient  has a past medical history of Acute pancreatitis, Asthma, GERD (gastroesophageal reflux disease), Hemorrhoid, varicella, Ovarian cyst, Recurrent boils, Thrombocytosis (06/09/2016), and UTI (urinary tract infection).   Past Surgical History: She  has a past surgical history that includes Tonsillectomy; Wisdom tooth extraction; Cesarean section (N/A, 05/25/2016); Flexible sigmoidoscopy (N/A, 11/30/2021); and Back surgery.   Medications: She has a current medication list which includes the following prescription(s): albuterol, cetirizine, clindamycin, cyclobenzaprine, escitalopram, esomeprazole, ketoconazole, linaclotide, medroxyprogesterone acetate, montelukast, and ondansetron.   Allergies: Patient is allergic to bactrim ds [sulfamethoxazole-trimethoprim], cipro [ciprofloxacin hcl], amoxicillin, and penicillins.   Social History: Patient  reports that she quit smoking about 9 years ago. Her smoking use included cigarettes. She has quit using smokeless tobacco. She reports current alcohol use of about 1.0 standard drink of alcohol per week. She reports that she does not use drugs.     OBJECTIVE    Lab Results  Component Value Date   COLORU Yellow 09/17/2023   CLARITYU Clear 09/17/2023   GLUCOSEUR  Negative 09/17/2023   BILIRUBINUR Negative 09/17/2023   KETONESU Negative 09/17/2023   SPECGRAV 1.015 09/17/2023   RBCUR Negative 09/17/2023   PHUR 6.0 09/17/2023   PROTEINUR Negative 09/17/2023   UROBILINOGEN 0.2 09/17/2023   LEUKOCYTESUR Negative 09/17/2023     Physical Exam: Vitals:   09/17/23 1311  BP: 122/83  Pulse: 95   Gen: No apparent distress, A&O x 3.  Detailed Urogynecologic Evaluation:  Vaginal canal looks normal with no mass, bleeding, or concerning vaginal discharge.   Patient has urethral pain on palpation under the urethra. The skin at the posterior fourchette still appears more irritated but she denies pain on palpation.   Bladder Instillation: The patient was identified and verbally consented for the procedure.  The urethra was prepped with Betadine x 3. A 16 Fr foley catheter was inserted the bladder and drained for 20 cc. The foley was then attached to a 60 mL syringe with the plunger removed.  The medication was slowly poured into the bladder via the syringe and foley.  The medication consisted of: 20ml of Lidocaine 2%, 20mL of Bupivicaine 0.25%, 10,000 units/mL Heparin, 5mL Sodium Bicarbonate 8.4%.  The foley was removed and the patient was asked to hold the liquids in her bladder for 30-60 minutes if possible.       ASSESSMENT AND PLAN    Ms. Boerner is a 33 y.o. with:  1. Urinary frequency   2. IC (interstitial cystitis)    For patient's urinary frequency we discussed trying to make sure that she is continuing to decrease her caffeine intake as well as bladder irritants.  She had a bladder instillation on December 27 and had gone a month with highly decreased pain. Patient was started on antianxiety medication which will probably help her  interstitial cystitis.  We discussed that her constantly being on edge and her anxiety levels do play a factor in her interstitial cystitis pain.  We discussed that urethral pain can be a significant part of  interstitial cystitis as well.  A bladder instillation was done at today's visit.  I would like for her to come for at least 2 more in a row over the next 2 weeks at 1 next week and week after so that we can try to get this pain under control.  She is agreeable to this plan of care.   Patient to return for bladder installations over the next few weeks and I will plan to follow up with her in 3 months or sooner if needed.   Selmer Dominion, NP

## 2023-09-24 ENCOUNTER — Ambulatory Visit (INDEPENDENT_AMBULATORY_CARE_PROVIDER_SITE_OTHER): Payer: Medicaid Other

## 2023-09-24 VITALS — BP 123/83 | HR 86

## 2023-09-24 DIAGNOSIS — R350 Frequency of micturition: Secondary | ICD-10-CM | POA: Diagnosis not present

## 2023-09-24 DIAGNOSIS — N3010 Interstitial cystitis (chronic) without hematuria: Secondary | ICD-10-CM | POA: Diagnosis not present

## 2023-09-24 LAB — POCT URINALYSIS DIPSTICK
Bilirubin, UA: NEGATIVE
Blood, UA: NEGATIVE
Glucose, UA: NEGATIVE
Ketones, UA: NEGATIVE
Leukocytes, UA: NEGATIVE
Nitrite, UA: NEGATIVE
Protein, UA: NEGATIVE
Spec Grav, UA: 1.025 (ref 1.010–1.025)
Urobilinogen, UA: 0.2 U/dL
pH, UA: 5.5 (ref 5.0–8.0)

## 2023-09-24 MED ORDER — LIDOCAINE HCL 2 % IJ SOLN
20.0000 mL | Freq: Once | INTRAMUSCULAR | Status: AC
  Administered 2023-09-24: 400 mg

## 2023-09-24 MED ORDER — BUPIVACAINE HCL 0.25 % IJ SOLN
20.0000 mL | Freq: Once | INTRAMUSCULAR | Status: AC
  Administered 2023-09-24: 20 mL

## 2023-09-24 MED ORDER — SODIUM BICARBONATE 8.4 % IV SOLN
5.0000 mL | Freq: Once | INTRAVENOUS | Status: AC
  Administered 2023-09-24: 5 mL

## 2023-09-24 MED ORDER — HEPARIN SODIUM (PORCINE) 10000 UNIT/ML IJ SOLN
10000.0000 [IU] | Freq: Once | INTRAMUSCULAR | Status: AC
  Administered 2023-09-24: 10000 [IU] via INTRAVESICAL

## 2023-09-24 NOTE — Addendum Note (Signed)
Addended by: Sammuel Cooper on: 09/24/2023 12:16 PM   Modules accepted: Orders

## 2023-09-24 NOTE — Patient Instructions (Signed)
Please keep all future appointments.

## 2023-09-24 NOTE — Progress Notes (Signed)
Bladder Instillation: The patient was identified and verbally consented for the procedure.  The urethra was prepped with Betadine x 3. A 16 Fr foley catheter was inserted the bladder and drained for _10___ cc. The foley was then attached to a 60 mL syringe with the plunger removed.  The medication was slowly poured into the bladder via the syringe and foley.  The medication consisted of: 20ml of Lidocaine 2%, 20mL of Bupivicaine 0.25%, 10,000 units/mL Heparin, 5mL Sodium Bicarbonate 8.4%.  The foley was removed and the patient was asked to hold the liquids in her bladder for 30-60 minutes if possible.   Precautions were given and patient was instructed to call the office or on-call number for any concerns.  Sammuel Cooper, CMA

## 2023-10-01 ENCOUNTER — Ambulatory Visit (INDEPENDENT_AMBULATORY_CARE_PROVIDER_SITE_OTHER): Payer: Medicaid Other

## 2023-10-01 DIAGNOSIS — R350 Frequency of micturition: Secondary | ICD-10-CM | POA: Diagnosis not present

## 2023-10-01 DIAGNOSIS — R3989 Other symptoms and signs involving the genitourinary system: Secondary | ICD-10-CM

## 2023-10-01 LAB — POCT URINALYSIS DIPSTICK
Bilirubin, UA: NEGATIVE
Blood, UA: NEGATIVE
Glucose, UA: NEGATIVE
Ketones, UA: NEGATIVE
Leukocytes, UA: NEGATIVE
Nitrite, UA: NEGATIVE
Protein, UA: NEGATIVE
Spec Grav, UA: <=1.005 — AB (ref 1.010–1.025)
Urobilinogen, UA: 0.2 U/dL
pH, UA: 5.5 (ref 5.0–8.0)

## 2023-10-01 MED ORDER — SODIUM BICARBONATE 8.4 % IV SOLN
5.0000 mL | Freq: Once | INTRAVENOUS | Status: AC
  Administered 2023-10-01: 5 mL

## 2023-10-01 MED ORDER — BUPIVACAINE HCL 0.25 % IJ SOLN
20.0000 mL | Freq: Once | INTRAMUSCULAR | Status: AC
Start: 2023-10-01 — End: 2023-10-01
  Administered 2023-10-01: 20 mL

## 2023-10-01 MED ORDER — LIDOCAINE HCL 2 % IJ SOLN
20.0000 mL | Freq: Once | INTRAMUSCULAR | Status: AC
  Administered 2023-10-01: 400 mg

## 2023-10-01 MED ORDER — HEPARIN SODIUM (PORCINE) 10000 UNIT/ML IJ SOLN
10000.0000 [IU] | Freq: Once | INTRAMUSCULAR | Status: AC
  Administered 2023-10-01: 10000 [IU] via INTRAVESICAL

## 2023-10-01 NOTE — Patient Instructions (Signed)
 Please keep all future appointments and if you have any questions or concerns please feel free to contact our office at [phone].

## 2023-10-01 NOTE — Progress Notes (Deleted)
 Maria [name] is a 33 y.o. female  arrived today with UTI sx.  A urine specimen was collected and POCT Urine was done. POCT Urine was Negative

## 2023-10-01 NOTE — Progress Notes (Signed)
 Bladder Instillation: The patient was identified and verbally consented for the procedure.  The urethra was prepped with Betadine x 3. A 14 Fr foley catheter was inserted the bladder and drained for 120 cc. The foley was then attached to a 60 mL syringe with the plunger removed.  The medication was slowly poured into the bladder via the syringe and foley.  The medication consisted of: 20ml of Lidocaine  2%, 20mL of Bupivicaine 0.25%, 10,000 units/mL Heparin , 5mL Sodium Bicarbonate  8.4%.  The foley was removed and the patient was asked to hold the liquids in her bladder for 30-60 minutes if possible.   Precautions were given and patient was instructed to call the office or on-call number for any concerns.  Marcelline Sermons, CMA

## 2023-10-02 ENCOUNTER — Other Ambulatory Visit: Payer: Self-pay | Admitting: Nurse Practitioner

## 2023-10-02 DIAGNOSIS — F411 Generalized anxiety disorder: Secondary | ICD-10-CM

## 2023-10-03 ENCOUNTER — Other Ambulatory Visit: Payer: Self-pay | Admitting: Nurse Practitioner

## 2023-10-03 ENCOUNTER — Encounter: Payer: Self-pay | Admitting: Obstetrics and Gynecology

## 2023-10-03 DIAGNOSIS — R110 Nausea: Secondary | ICD-10-CM

## 2023-10-03 MED ORDER — ONDANSETRON HCL 4 MG PO TABS: 4.0000 mg | ORAL_TABLET | Freq: Three times a day (TID) | ORAL | 0 refills | Status: DC | PRN

## 2023-10-03 NOTE — Telephone Encounter (Signed)
Requested Prescriptions  Pending Prescriptions Disp Refills   escitalopram (LEXAPRO) 10 MG tablet [Pharmacy Med Name: ESCITALOPRAM 10 MG TABLET] 90 tablet 0    Sig: TAKE 1 TABLET BY MOUTH EVERY DAY     Psychiatry:  Antidepressants - SSRI Passed - 10/03/2023 12:11 PM      Passed - Valid encounter within last 6 months    Recent Outpatient Visits           3 weeks ago Gastroesophageal reflux disease without esophagitis   [facility] Berniece Salines, FNP   1 month ago Irregular heart beat   [facility] Berniece Salines, FNP   3 months ago Frequent headaches   [facility] Berniece Salines, FNP   6 months ago Neck pain   [facility] Berniece Salines, FNP   8 months ago Dysuria   [facility] Berniece Salines, FNP       Future Appointments             In 5 days Zane Herald, Rudolpho Sevin, FNP [facility]   In 1 week Marykay Lex, MD [facility] [place]   In 2 months Cordelia Pen, Joan Mayans, NP [facility]

## 2023-10-03 NOTE — Telephone Encounter (Signed)
Patient was contacted

## 2023-10-08 ENCOUNTER — Encounter: Payer: Self-pay | Admitting: Nurse Practitioner

## 2023-10-08 ENCOUNTER — Ambulatory Visit: Payer: Medicaid Other | Admitting: Nurse Practitioner

## 2023-10-08 VITALS — BP 108/72 | HR 103 | Temp 98.0°F | Resp 18 | Ht 62.0 in | Wt 150.7 lb

## 2023-10-08 DIAGNOSIS — F411 Generalized anxiety disorder: Secondary | ICD-10-CM

## 2023-10-08 MED ORDER — ESCITALOPRAM OXALATE 20 MG PO TABS: 20.0000 mg | ORAL_TABLET | Freq: Every day | ORAL | 0 refills | Status: DC

## 2023-10-08 NOTE — Progress Notes (Signed)
BP 108/72   Pulse (!) 103   Temp 98 F (36.7 C)   Resp 18   Ht 5\' 2"  (1.575 m)   Wt 150 lb 11.2 oz (68.4 kg)   SpO2 96%   BMI 27.56 kg/m    Subjective:    Patient ID: Maria [name], female    DOB: [birth year], 33 y.o.   MRN: [id number]  HPI: Maria [name] is a 33 y.o. female  Chief Complaint  Patient presents with   Follow-up   Medical Management of Chronic Issues    Discussed the use of AI scribe software for clinical note transcription with the patient, who gave verbal consent to proceed.  History of Present Illness   The patient, with a history of anxiety, headaches, palpitations, and stomach problems, reports an overall improvement in her anxiety since starting medication. She notes that the first day on the medication felt a little different, but not as bad as having anxiety. The patient's headaches have also improved, occurring less frequently and not as severely. The palpitations initially went away, but have returned, particularly at night and in the morning. The patient attributes this to her child's worsening seizure condition.  The patient's child has been having seizures that are reportedly different and worse than before. The seizures are frequent, leading to an emergency room visit a few days ago. The patient reports that during these seizures, it appears as if the child is struggling to breathe. The patient is trying to arrange an EEG for the child.  The patient also reports ongoing stomach problems, which she attributes to irritable bowel syndrome (IBS) and interstitial cystitis. She experiences frequent nausea, for which she has medication, and has difficulty eating large meals. The patient is considering the need for another colonoscopy and plans to discuss this with her gastroenterologist.       10/08/2023   11:02 AM 09/10/2023   10:07 AM 08/30/2023    9:18 AM  Depression screen PHQ 2/9  Decreased Interest 0 1 0  Down, Depressed, Hopeless 0 0 0   PHQ - 2 Score 0 1 0  Altered sleeping 2 3 3   Tired, decreased energy 1 1 0  Change in appetite 1 1 0  Feeling bad or failure about yourself  0 0 0  Trouble concentrating 1 0 2  Moving slowly or fidgety/restless 1 0 0  Suicidal thoughts 0 0 0  PHQ-9 Score 6 6 5   Difficult doing work/chores Not difficult at all Somewhat difficult Somewhat difficult    Relevant past medical, surgical, family and social history reviewed and updated as indicated. Interim medical history since our last visit reviewed. Allergies and medications reviewed and updated.  Review of Systems  Ten systems reviewed and is negative except as mentioned in HPI      Objective:    BP 108/72   Pulse (!) 103   Temp 98 F (36.7 C)   Resp 18   Ht 5\' 2"  (1.575 m)   Wt 150 lb 11.2 oz (68.4 kg)   SpO2 96%   BMI 27.56 kg/m    Wt Readings from Last 3 Encounters:  10/08/23 150 lb 11.2 oz (68.4 kg)  09/10/23 151 lb 11.2 oz (68.8 kg)  08/30/23 151 lb 6.4 oz (68.7 kg)    Physical Exam Vitals reviewed.  Constitutional:      Appearance: Normal appearance.  HENT:     Head: Normocephalic.  Cardiovascular:     Rate and Rhythm: Normal rate and  regular rhythm.  Pulmonary:     Effort: Pulmonary effort is normal.     Breath sounds: Normal breath sounds.  Musculoskeletal:        General: Normal range of motion.  Skin:    General: Skin is warm and dry.  Neurological:     General: No focal deficit present.     Mental Status: She is alert and oriented to person, place, and time. Mental status is at baseline.  Psychiatric:        Mood and Affect: Mood normal.        Behavior: Behavior normal.        Thought Content: Thought content normal.        Judgment: Judgment normal.     Results for orders placed or performed in visit on 10/01/23  POCT Urinalysis Dipstick   Collection Time: 10/01/23  3:22 PM  Result Value Ref Range   Color, UA yellow    Clarity, UA clear    Glucose, UA Negative Negative   Bilirubin, UA  negative    Ketones, UA negative    Spec Grav, UA <=1.005 (A) 1.010 - 1.025   Blood, UA negative    pH, UA 5.5 5.0 - 8.0   Protein, UA Negative Negative   Urobilinogen, UA 0.2 0.2 or 1.0 E.U./dL   Nitrite, UA negative    Leukocytes, UA Negative Negative   Appearance     Odor         Assessment & Plan:   Problem List Items Addressed This Visit       Other   GAD (generalized anxiety disorder) - Primary   Relevant Medications   escitalopram (LEXAPRO) 20 MG tablet     Assessment and Plan    Anxiety Improved with Lexapro, but still experiencing palpitations, particularly at night and in the morning. These symptoms have increased with the recurrence of her child's seizures. No panic attacks. -Increase Lexapro to 20mg  daily. -Return in 4 weeks to assess response to increased dosage.  Headaches Improved, not as frequent or severe as before. -Continue current management.  Palpitations Occurring at night and in the morning, not as severe as before. -Continue monitoring. If palpitations persist at follow-up, consider further evaluation.  Insomnia Difficulty sleeping due to anxiety about child's health. -Continue current management and monitor at follow-up.  Irritable Bowel Syndrome (IBS) Ongoing stomach problems, nausea, and irregular bowel movements. -Continue Linzess as tolerated. -Consider gastroenterology consult for further evaluation and management.  General Health Maintenance -Cardiology appointment scheduled for 10/11/2023. -Plan to contact gastroenterologist for an appointment. -Return in 4 weeks for follow-up.        Follow up plan: Return in about 4 weeks (around 11/05/2023) for follow up.

## 2023-10-11 ENCOUNTER — Ambulatory Visit: Payer: Medicaid Other | Admitting: Cardiology

## 2023-10-11 DIAGNOSIS — R002 Palpitations: Secondary | ICD-10-CM | POA: Insufficient documentation

## 2023-10-11 NOTE — Progress Notes (Deleted)
  Cardiology Office Note:  .   Date:  10/11/2023  ID:  Maria [name], DOB 07/19/1991, MRN [id number] PCP: Berniece Salines, FNP  [facility] Providers Cardiologist:  None { Click to update primary MD,subspecialty MD or APP then REFRESH:1}    No chief complaint on file.   Patient Profile: .     Maria [name] is a  33 y.o. female with a PMH notable for GAD who presents here for evaluation of palpitations at the request of Berniece Salines, FNP.     Maria [name] has been seen by Della Goo, FNP for palpitations for the last several months.  These been attributed to anxiety related to her son's diagnosis with seizures IVUS sleep disorder.  She notes a sensation of heart racing-most notable when she is trying to sleep.  She was initially evaluated with a monitor which revealed no arrhythmias, however when symptoms persisted, she has not been referred to cardiology  Subjective  Discussed the use of AI scribe software for clinical note transcription with the patient, who gave verbal consent to proceed.  History of Present Illness         ROS:  Review of Systems - {ros master:[id number]}    Objective    Studies Reviewed: Marland Kitchen        Zio patch monitor December 2024: Heart rate range 53-1 73 with an average of 78 bpm.  1 brief episode of 6 beat NSVT.  Rare PACs and PVCs.  No sustained arrhythmia.  No A-fib.  Risk Assessment/Calculations:           Physical Exam:   VS:  There were no vitals taken for this visit.   Wt Readings from Last 3 Encounters:  10/08/23 150 lb 11.2 oz (68.4 kg)  09/10/23 151 lb 11.2 oz (68.8 kg)  08/30/23 151 lb 6.4 oz (68.7 kg)    GEN: Well nourished, well developed in no acute distress; *** NECK: No JVD; No carotid bruits CARDIAC: Normal S1, S2; RRR, no murmurs, rubs, gallops RESPIRATORY:  Clear to auscultation without rales, wheezing or rhonchi ; nonlabored, good air movement. ABDOMEN: Soft, non-tender,  non-distended EXTREMITIES:  No edema; No deformity      ASSESSMENT AND PLAN: .    Problem List Items Addressed This Visit   None   Assessment and Plan                {Are you ordering a CV Procedure (e.g. stress test, cath, DCCV, TEE, etc)?   Press F2        :[id number]}   Follow-Up: No follow-ups on file.  Total time spent: *** min spent with patient + *** min spent charting = *** min    Signed, Marykay Lex, MD, MS Bryan Lemma, M.D., M.S. Interventional Cardiologist  [facility]  Pager # [phone] Phone # [phone] [address] [place], [place] [address]

## 2023-10-22 ENCOUNTER — Other Ambulatory Visit: Payer: Self-pay | Admitting: Nurse Practitioner

## 2023-10-22 DIAGNOSIS — F411 Generalized anxiety disorder: Secondary | ICD-10-CM

## 2023-10-23 ENCOUNTER — Encounter: Payer: Self-pay | Admitting: Obstetrics and Gynecology

## 2023-10-23 ENCOUNTER — Ambulatory Visit (INDEPENDENT_AMBULATORY_CARE_PROVIDER_SITE_OTHER)

## 2023-10-23 VITALS — BP 124/72 | HR 88

## 2023-10-23 DIAGNOSIS — R350 Frequency of micturition: Secondary | ICD-10-CM

## 2023-10-23 DIAGNOSIS — R3989 Other symptoms and signs involving the genitourinary system: Secondary | ICD-10-CM

## 2023-10-23 LAB — POCT URINALYSIS DIPSTICK
Bilirubin, UA: NEGATIVE
Blood, UA: NEGATIVE
Glucose, UA: NEGATIVE
Ketones, UA: NEGATIVE
Leukocytes, UA: NEGATIVE
Nitrite, UA: NEGATIVE
Protein, UA: NEGATIVE
Spec Grav, UA: 1.015 (ref 1.010–1.025)
Urobilinogen, UA: 0.2 U/dL
pH, UA: 8 (ref 5.0–8.0)

## 2023-10-23 MED ORDER — HEPARIN SODIUM (PORCINE) 10000 UNIT/ML IJ SOLN
10000.0000 [IU] | Freq: Once | INTRAMUSCULAR | Status: AC
  Administered 2023-10-23: 10000 [IU] via INTRAVESICAL

## 2023-10-23 MED ORDER — BUPIVACAINE HCL 0.25 % IJ SOLN
20.0000 mL | Freq: Once | INTRAMUSCULAR | Status: AC
  Administered 2023-10-23: 20 mL

## 2023-10-23 MED ORDER — SODIUM BICARBONATE 8.4 % IV SOLN
5.0000 mL | Freq: Once | INTRAVENOUS | Status: AC
  Administered 2023-10-23: 5 mL

## 2023-10-23 MED ORDER — LIDOCAINE HCL 2 % IJ SOLN
20.0000 mL | Freq: Once | INTRAMUSCULAR | Status: AC
  Administered 2023-10-23: 400 mg

## 2023-10-23 NOTE — Telephone Encounter (Signed)
 Requested medication (s) are due for refill today: Yes  Requested medication (s) are on the active medication list: Yes  Last refill:  10/08/23  Future visit scheduled: Yes  Notes to clinic:  See pharmacy request.    Requested Prescriptions  Pending Prescriptions Disp Refills   escitalopram (LEXAPRO) 20 MG tablet [Pharmacy Med Name: ESCITALOPRAM 20 MG TABLET] 90 tablet 1    Sig: TAKE 1 TABLET BY MOUTH EVERY DAY     There is no refill protocol information for this order

## 2023-10-23 NOTE — Patient Instructions (Signed)
 Please keep all future appointments and if you have any questions or concerns please feel free to contact our office at [phone].

## 2023-10-23 NOTE — Progress Notes (Signed)
 Bladder Instillation: The patient was identified and verbally consented for the procedure.  The urethra was prepped with Betadine x 3. A 14 Fr foley catheter was inserted the bladder and drained for 30 cc. The foley was then attached to a 60 mL syringe with the plunger removed.  The medication was slowly poured into the bladder via the syringe and foley.  The medication consisted of: 20ml of Lidocaine 2%, 20mL of Bupivicaine 0.25%, 10,000 units/mL Heparin, 5mL Sodium Bicarbonate 8.4%.  The foley was removed and the patient was asked to hold the liquids in her bladder for 30-60 minutes if possible.   Precautions were given and patient was instructed to call the office or on-call number for any concerns.  Thressa Sheller, CMA

## 2023-10-30 ENCOUNTER — Other Ambulatory Visit: Payer: Self-pay

## 2023-10-30 DIAGNOSIS — L700 Acne vulgaris: Secondary | ICD-10-CM

## 2023-10-30 DIAGNOSIS — L739 Follicular disorder, unspecified: Secondary | ICD-10-CM

## 2023-10-30 MED ORDER — CLINDAMYCIN PHOSPHATE 1 % EX SOLN: CUTANEOUS | 2 refills | Status: AC

## 2023-10-30 MED ORDER — KETOCONAZOLE 2 % EX SHAM: 1.0000 | MEDICATED_SHAMPOO | CUTANEOUS | 2 refills | Status: DC

## 2023-11-07 ENCOUNTER — Ambulatory Visit (INDEPENDENT_AMBULATORY_CARE_PROVIDER_SITE_OTHER): Payer: Medicaid Other | Admitting: Nurse Practitioner

## 2023-11-07 ENCOUNTER — Encounter: Payer: Self-pay | Admitting: Nurse Practitioner

## 2023-11-07 VITALS — BP 124/76 | HR 96 | Resp 18 | Ht 62.0 in | Wt 152.0 lb

## 2023-11-07 DIAGNOSIS — F411 Generalized anxiety disorder: Secondary | ICD-10-CM | POA: Diagnosis not present

## 2023-11-07 MED ORDER — BUSPIRONE HCL 5 MG PO TABS: 5.0000 mg | ORAL_TABLET | Freq: Two times a day (BID) | ORAL | 1 refills | Status: DC | PRN

## 2023-11-07 NOTE — Telephone Encounter (Signed)
 Patient is scheduled for 11/07/23 at 11:40am

## 2023-11-07 NOTE — Progress Notes (Signed)
 BP 124/76   Pulse 96   Resp 18   Ht 5\' 2"  (1.575 m)   Wt 152 lb (68.9 kg)   SpO2 98%   BMI 27.80 kg/m    Subjective:    Patient ID: Maria [name], female    DOB: 02-12-1991, 33 y.o.   MRN: [id number]  HPI: Maria [name] is a 33 y.o. female  Chief Complaint  Patient presents with   Anxiety    Discussed the use of AI scribe software for clinical note transcription with the patient, who gave verbal consent to proceed.  History of Present Illness   Maria [name] "Maria [name]" is a 33 year old female who presents with anxiety for a follow-up visit.  Four weeks ago, her Lexapro dosage was increased to 20 mg daily, resulting in some improvement in her anxiety symptoms. She experiences less frequent and severe episodes of body tightening and tension. Her anxiety is particularly heightened at night, coinciding with her son's seizures, which adds to her stress.  She has a history of ADHD, contributing to her hyperactivity and difficulty sleeping. Despite these challenges, her anxiety symptoms have improved compared to the past.  She is concerned about her son's health, as he has been experiencing seizures and is scheduled to see a specialist at [facility]. His medication regimen has been adjusted, and she monitors his sleep closely with a camera. She notes that his new medication, helps him sleep but causes him to snore and breathe differently.       11/07/2023    2:56 PM 10/08/2023   11:02 AM 09/10/2023   10:07 AM  Depression screen PHQ 2/9  Decreased Interest 0 0 1  Down, Depressed, Hopeless 0 0 0  PHQ - 2 Score 0 0 1  Altered sleeping 2 2 3   Tired, decreased energy 1 1 1   Change in appetite 0 1 1  Feeling bad or failure about yourself  0 0 0  Trouble concentrating 1 1 0  Moving slowly or fidgety/restless 1 1 0  Suicidal thoughts 0 0 0  PHQ-9 Score 5 6 6   Difficult doing work/chores Not difficult at all Not difficult at all Somewhat difficult       11/07/2023     2:55 PM 10/08/2023   11:02 AM 09/10/2023   10:08 AM 08/30/2023    9:19 AM  GAD 7 : Generalized Anxiety Score  Nervous, Anxious, on Edge 1 1 1 3   Control/stop worrying 1 0 1 3  Worry too much - different things 1 0 1 3  Trouble relaxing 1 0 1 3  Restless 1 0 1 3  Easily annoyed or irritable 0 0 0 3  Afraid - awful might happen 1 1 1 3   Total GAD 7 Score 6 2 6 21   Anxiety Difficulty Not difficult at all Not difficult at all Somewhat difficult Very difficult    Relevant past medical, surgical, family and social history reviewed and updated as indicated. Interim medical history since our last visit reviewed. Allergies and medications reviewed and updated.  Review of Systems Constitutional: Negative for fever or weight change.  Respiratory: Negative for cough and shortness of breath.   Cardiovascular: Negative for chest pain or palpitations.  Gastrointestinal: Negative for abdominal pain, no bowel changes.  Musculoskeletal: Negative for gait problem or joint swelling.  Skin: Negative for rash.  Neurological: Negative for dizziness or headache.  No other specific complaints in a complete review of systems (except as listed in HPI  above).      Objective:    BP 124/76   Pulse 96   Resp 18   Ht 5\' 2"  (1.575 m)   Wt 152 lb (68.9 kg)   SpO2 98%   BMI 27.80 kg/m    Wt Readings from Last 3 Encounters:  11/07/23 152 lb (68.9 kg)  10/08/23 150 lb 11.2 oz (68.4 kg)  09/10/23 151 lb 11.2 oz (68.8 kg)    Physical Exam Vitals reviewed.  Constitutional:      Appearance: Normal appearance.  HENT:     Head: Normocephalic.  Cardiovascular:     Rate and Rhythm: Normal rate and regular rhythm.  Pulmonary:     Effort: Pulmonary effort is normal.     Breath sounds: Normal breath sounds.  Musculoskeletal:        General: Normal range of motion.  Skin:    General: Skin is warm and dry.  Neurological:     General: No focal deficit present.     Mental Status: She is alert and oriented  to person, place, and time. Mental status is at baseline.  Psychiatric:        Mood and Affect: Mood normal.        Behavior: Behavior normal.        Thought Content: Thought content normal.        Judgment: Judgment normal.     Results for orders placed or performed in visit on 10/23/23  POCT Urinalysis Dipstick   Collection Time: 10/23/23  3:18 PM  Result Value Ref Range   Color, UA yellow    Clarity, UA clear    Glucose, UA Negative Negative   Bilirubin, UA negative    Ketones, UA negative    Spec Grav, UA 1.015 1.010 - 1.025   Blood, UA negative    pH, UA 8.0 5.0 - 8.0   Protein, UA Negative Negative   Urobilinogen, UA 0.2 0.2 or 1.0 E.U./dL   Nitrite, UA negative    Leukocytes, UA Negative Negative   Appearance     Odor         Assessment & Plan:   Problem List Items Addressed This Visit       Other   GAD (generalized anxiety disorder) - Primary   Relevant Medications   busPIRone (BUSPAR) 5 MG tablet     Assessment and Plan    Generalized Anxiety Disorder   Generalized anxiety has improved with Lexapro 20 mg daily, but she continues to experience nocturnal anxiety related to her son's seizures, with occasional body tightening and difficulty relaxing. She is hesitant to add another medication due to concerns about medication use. Buspar was discussed as an as-needed option for additional anxiety control, with benefits including minimal sedation, allowing her to remain alert for her son's needs. She prefers to wait for her son's upcoming medical appointment before deciding.   - Continue Lexapro 20 mg daily.   - Prescribe Buspar as needed for anxiety, to be used at her discretion.   - Follow up in three months or sooner if needed.    Attention-Deficit/Hyperactivity Disorder (ADHD)   ADHD contributes to her hyperactivity and difficulty sleeping, impacting her overall anxiety and sleep issues.        Follow up plan: Return in about 3 months (around 02/07/2024) for  follow up.

## 2023-11-07 NOTE — Telephone Encounter (Signed)
 Would you like me to schedule an office visit or a nurse visit?

## 2023-11-07 NOTE — Telephone Encounter (Signed)
 Attempted to contact patient. LVMTRC

## 2023-11-08 ENCOUNTER — Ambulatory Visit: Admitting: Obstetrics and Gynecology

## 2023-11-28 ENCOUNTER — Other Ambulatory Visit: Payer: Self-pay | Admitting: Nurse Practitioner

## 2023-11-28 DIAGNOSIS — F411 Generalized anxiety disorder: Secondary | ICD-10-CM

## 2023-11-29 NOTE — Telephone Encounter (Signed)
 Requested medication (s) are due for refill today: Yes  Requested medication (s) are on the active medication list: Yes  Last refill:  11/07/23  Future visit scheduled:   Notes to clinic:  See pharmacy request.    Requested Prescriptions  Pending Prescriptions Disp Refills   busPIRone (BUSPAR) 5 MG tablet [Pharmacy Med Name: BUSPIRONE HCL 5 MG TABLET] 180 tablet 1    Sig: Take 1 tablet (5 mg total) by mouth 2 (two) times daily as needed (anxiety).     Psychiatry: Anxiolytics/Hypnotics - Non-controlled Passed - 11/29/2023  8:02 AM      Passed - Valid encounter within last 12 months    Recent Outpatient Visits           3 weeks ago GAD (generalized anxiety disorder)   [facility] Berniece Salines, FNP   1 month ago GAD (generalized anxiety disorder)   [facility] Berniece Salines, FNP       Future Appointments             In 2 weeks Cordelia Pen, Joan Mayans, NP [place] [facility], [facility]   In 2 weeks Deirdre Evener, MD [facility]   In 1 month Gollan, Tollie Pizza, MD [facility] [place]   In 2 months Zane Herald, Rudolpho Sevin, FNP [facility], [facility]

## 2023-11-30 ENCOUNTER — Other Ambulatory Visit: Payer: Self-pay | Admitting: Obstetrics and Gynecology

## 2023-11-30 DIAGNOSIS — R3989 Other symptoms and signs involving the genitourinary system: Secondary | ICD-10-CM

## 2023-11-30 MED ORDER — LIDOCAINE-PRILOCAINE 2.5-2.5 % EX CREA: 1.0000 | TOPICAL_CREAM | CUTANEOUS | 0 refills | Status: DC | PRN

## 2023-12-06 ENCOUNTER — Other Ambulatory Visit: Payer: Self-pay | Admitting: Nurse Practitioner

## 2023-12-06 DIAGNOSIS — K219 Gastro-esophageal reflux disease without esophagitis: Secondary | ICD-10-CM

## 2023-12-06 NOTE — Telephone Encounter (Signed)
 Requested Prescriptions  Pending Prescriptions Disp Refills   esomeprazole (NEXIUM) 40 MG capsule [Pharmacy Med Name: ESOMEPRAZOLE MAG DR 40 MG CAP] 90 capsule 1    Sig: TAKE 1 CAPSULE (40 MG TOTAL) BY MOUTH DAILY.     Gastroenterology: Proton Pump Inhibitors 2 Passed - 12/06/2023  5:56 PM      Passed - ALT in normal range and within 360 days    ALT  Date Value Ref Range Status  08/26/2023 38 0 - 44 U/L Final   SGPT (ALT)  Date Value Ref Range Status  10/08/2013 20 12 - 78 U/L Final         Passed - AST in normal range and within 360 days    AST  Date Value Ref Range Status  08/26/2023 30 15 - 41 U/L Final   SGOT(AST)  Date Value Ref Range Status  10/08/2013 22 15 - 37 Unit/L Final         Passed - Valid encounter within last 12 months    Recent Outpatient Visits           4 weeks ago GAD (generalized anxiety disorder)   [facility] Quinton Buckler, FNP   1 month ago GAD (generalized anxiety disorder)   [facility] Quinton Buckler, FNP       Future Appointments             In 1 week Zuleta, Kaitlin G, NP [place] [facility], [facility]   In 1 week Elta Halter, MD [facility]   In 3 weeks Gollan, Deadra Everts, MD [facility] [place]   In 2 months Abram Hoguet, Monalisa Angles, FNP [facility], [facility]

## 2023-12-11 ENCOUNTER — Telehealth: Payer: Self-pay | Admitting: Physician Assistant

## 2023-12-11 NOTE — Telephone Encounter (Signed)
 The patient called in to get her medicine refill.

## 2023-12-13 ENCOUNTER — Telehealth: Payer: Self-pay

## 2023-12-13 NOTE — Telephone Encounter (Signed)
 Patient called office today requesting refill- when I called her back to see which medication she needed it was a bad connection and were disconnected-however I tried calling multiple times with no answer. Not sure which medication she is requesting.

## 2023-12-17 ENCOUNTER — Encounter: Payer: Self-pay | Admitting: Obstetrics

## 2023-12-17 ENCOUNTER — Ambulatory Visit: Payer: Medicaid Other | Admitting: Obstetrics and Gynecology

## 2023-12-17 ENCOUNTER — Other Ambulatory Visit: Payer: Self-pay | Admitting: Nurse Practitioner

## 2023-12-17 ENCOUNTER — Ambulatory Visit (INDEPENDENT_AMBULATORY_CARE_PROVIDER_SITE_OTHER): Admitting: Obstetrics

## 2023-12-17 VITALS — BP 122/78 | HR 85

## 2023-12-17 DIAGNOSIS — K5909 Other constipation: Secondary | ICD-10-CM

## 2023-12-17 DIAGNOSIS — R102 Pelvic and perineal pain: Secondary | ICD-10-CM

## 2023-12-17 DIAGNOSIS — R351 Nocturia: Secondary | ICD-10-CM | POA: Diagnosis not present

## 2023-12-17 DIAGNOSIS — N3010 Interstitial cystitis (chronic) without hematuria: Secondary | ICD-10-CM | POA: Diagnosis not present

## 2023-12-17 MED ORDER — CIMETIDINE 200 MG PO TABS: 200.0000 mg | ORAL_TABLET | Freq: Two times a day (BID) | ORAL | 2 refills | Status: DC

## 2023-12-17 NOTE — Assessment & Plan Note (Addendum)
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  We also discussed using pyridium  and similar over-the-counter medications for pain relief as needed. We discussed the pentad of medications including Tums, an antihistamine such as Vistaril , amitriptyline, and L-arginine.  We also discussed in-office bladder instillations for pain flares, as well as cystoscopy with hydrodistention in the operating room, which can be both diagnostic and therapeutic.  - office to schedule cystoscopy to r/o Hunner ulcers, consider hydrodistention - discussed stress management, baths, and encouraged pelvic floor relaxation and yoga - continue limiting caffeine and bladder irritants  - Rx cimetidine due to difficulty sleeping and titrate as needed if no daytime sedation - continue Buspar  for anxiety - negative prior testing for UTI

## 2023-12-17 NOTE — Assessment & Plan Note (Addendum)
-   evaluated by Dr. Estanislao Heimlich in 2021, lost to follow-up. 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  - reviewed prior exam finding of high tone pelvic floor and myofascial pain, prior treatments include: Valium  suppositories, flomax, amitriptyline, gabapentin, oxycodone , meloxicam , and referred to pelvic floor PT - reviewed anatomy and discussed association with sensation of incomplete bladder emptying, dyspareunia and constipation - The origin of pelvic floor muscle spasm can be multifactorial, including primary, reactive to a different pain source, trauma, or even part of a centralized pain syndrome.Treatment options include pelvic floor physical therapy, local (vaginal) or oral  muscle relaxants, pelvic muscle trigger point injections or centrally acting pain medications.   - encouraged to schedule pelvic floor PT due to prior improvement in 2021 - encouraged optimization of bowel consistency, pelvic floor relaxation with improved bladder emptying in squatting position

## 2023-12-17 NOTE — Progress Notes (Signed)
 [facility] Return Visit  SUBJECTIVE  History of Present Illness: Maria [name] is a 33 y.o. female seen in follow-up for interstitial cystitis, urinary frequency, high tone pelvic floor, vaginal burning and dysuria. Plan at last visit was bladder instillations, minimize bladder irritations.   Report chronic history of lower urinary tract symptoms at urgent care evaluation, prior antibiotic use without relief.  07/12/23 with UA + protein only, no organisms detected on pathnostics, negative Nuswab. Burning located at urethra and bladder, alleviated by ice pack and EMLA  cream. Reports PT for L4-5 posterior lumbar fusion around 4 years ago, history of pelvic floor PT in [place] in 2021 due to constipation, bladder pain, back and hip pain with ADLs. Stopped Linzess  after pelvic floor PT.  Prior [facility] evaluation by Dr. Estanislao Maria [name] in 2021. Tried Valium  suppositories, flomax, amitriptyline, gabapentin, oxycodone , meloxicam , and referred to pelvic floor PT in the past.  Reports bladder symptoms returned around 1 year ago with increased stress due to son's seizures and anxiety with panic attacks. Reports doing better since starting anxiety medications. OSA without CPAP use Day time voids >10.  Nocturia: 3-5 x/night Drinks >72oz of fluid at night, drinks throughout the night, 16oz decaf coffee/day, intermittent soda intake  Uses Linzess  Urethral pain on exam, underwent bladder instillations without relief. Using Zyrtec, buspar , lexapro , EMLA  cream with relief Desires surgical intervention due to mother with diagnosis of IC and underwent "bladder scrapping" procedure with relief.  Past Medical History: Patient  has a past medical history of Acute pancreatitis, Asthma, GERD (gastroesophageal reflux disease), Hemorrhoid, varicella, Ovarian cyst, Recurrent boils, Thrombocytosis (06/09/2016), and UTI (urinary tract infection).   Past Surgical History: She  has a past  surgical history that includes Tonsillectomy; Wisdom tooth extraction; Cesarean section (N/A, 05/25/2016); Flexible sigmoidoscopy (N/A, 11/30/2021); and Back surgery.   Medications: She has a current medication list which includes the following prescription(s): albuterol , buspirone , cetirizine, cimetidine, clindamycin , cyclobenzaprine, escitalopram , esomeprazole , ketoconazole , lidocaine -prilocaine , linaclotide , medroxyprogesterone  acetate, montelukast , ondansetron , and medroxyprogesterone .   Allergies: Patient is allergic to bactrim  ds [sulfamethoxazole -trimethoprim ], cipro [ciprofloxacin hcl], amoxicillin, and penicillins.   Social History: Patient  reports that she quit smoking about 10 years ago. Her smoking use included cigarettes. She has quit using smokeless tobacco. She reports current alcohol use of about 1.0 standard drink of alcohol per week. She reports that she does not use drugs.     OBJECTIVE     Physical Exam: Vitals:   12/17/23 1341  BP: 122/78  Pulse: 85   Gen: No apparent distress, A&O x 3.  Detailed Urogynecologic Evaluation:  Deferred.        ASSESSMENT AND PLAN    [name] is a 33 y.o. with:  1. IC (interstitial cystitis)   2. Nocturia   3. Chronic constipation   4. 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  We also discussed using pyridium  and similar over-the-counter medications for pain relief as needed. We discussed the pentad of medications including Tums, an  antihistamine such as Vistaril , amitriptyline, and L-arginine.  We also discussed in-office bladder  instillations for pain flares, as well as cystoscopy with hydrodistention in the operating room, which can be both diagnostic and therapeutic.  - office to schedule cystoscopy to r/o Hunner ulcers, consider hydrodistention - discussed stress management, baths, and encouraged pelvic floor relaxation and yoga - continue limiting caffeine and bladder irritants  - Rx cimetidine due to difficulty sleeping and titrate as needed if no daytime sedation - continue Buspar  for anxiety - negative prior testing for UTI   Orders: -     Cimetidine; Take 1 tablet (200 mg total) by mouth 2 (two) times daily.  Dispense: 30 tablet; Refill: 2  Nocturia Assessment & Plan: For night time frequency: - avoid fluid intake 3 hours before bedtime - bladder scan WNL - fluid management and limiting to around 64oz/day    Chronic constipation Assessment & Plan: - continue Linzess  - For constipation, we reviewed the importance of a better bowel regimen.  We also discussed the importance of avoiding chronic straining, as it can exacerbate her pelvic floor symptoms; we discussed treating constipation and straining prior to surgery, as postoperative straining can lead to damage to the repair and recurrence of symptoms. We discussed initiating therapy with increasing fluid intake, fiber supplementation, stool softeners, and laxatives such as miralax.  - encouraged squatting position for bowel movement and avoid straining - referral sent to pelvic floor PT, encouraged pt to call for appointment due to high tone pelvic floor   Pelvic pain in female Assessment & Plan: - evaluated by Dr. Estanislao Maria [name] in 2021, lost to follow-up. 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  - reviewed prior exam finding of high tone pelvic floor and myofascial pain, prior treatments include: Valium   suppositories, flomax, amitriptyline, gabapentin, oxycodone , meloxicam , and referred to pelvic floor PT - reviewed anatomy and discussed association with sensation of incomplete bladder emptying, dyspareunia and constipation - The origin of pelvic floor muscle spasm can be multifactorial, including primary, reactive to a different pain source, trauma, or even part of a centralized pain syndrome.Treatment options include pelvic floor physical therapy, local (vaginal) or oral  muscle relaxants, pelvic muscle trigger point injections or centrally acting pain medications.   - encouraged to schedule pelvic floor PT due to prior improvement in 2021 - encouraged optimization of bowel consistency, pelvic floor relaxation with improved bladder emptying in squatting position   Time spent: I spent 52 minutes dedicated to the care of this patient on the date of this encounter to include pre-visit review of records, face-to-face time with the patient discussing interstitial cystitis, pelvic pain, constipation, nocturia and post visit documentation and ordering medication/ testing.    Darlene Ehlers, MD

## 2023-12-17 NOTE — Assessment & Plan Note (Signed)
-   continue Linzess  - For constipation, we reviewed the importance of a better bowel regimen.  We also discussed the importance of avoiding chronic straining, as it can exacerbate her pelvic floor symptoms; we discussed treating constipation and straining prior to surgery, as postoperative straining can lead to damage to the repair and recurrence of symptoms. We discussed initiating therapy with increasing fluid intake, fiber supplementation, stool softeners, and laxatives such as miralax.  - encouraged squatting position for bowel movement and avoid straining - referral sent to pelvic floor PT, encouraged pt to call for appointment due to high tone pelvic floor

## 2023-12-17 NOTE — Telephone Encounter (Signed)
 Copied from CRM [id number]. Topic: Clinical - Medication Refill >> Dec 17, 2023  3:54 PM Carlatta H wrote: Most Recent Primary Care Visit:  Provider: Donny Gall F  Department: [facility]  Visit Type: OFFICE VISIT  Date: 11/07/2023  Medication: Depo  Has the patient contacted their pharmacy? No (Agent: If no, request that the patient contact the pharmacy for the refill. If patient does not wish to contact the pharmacy document the reason why and proceed with request.) (Agent: If yes, when and what did the pharmacy advise?)  Is this the correct pharmacy for this prescription? Yes If no, delete pharmacy and type the correct one.  This is the patient's preferred pharmacy:  [id number] - [place], [place] - [address] AT [facility] [address] [place] [address] Phone: [phone] Fax: [phone]   Has the prescription been filled recently? No  Is the patient out of the medication? Yes  Has the patient been seen for an appointment in the last year OR does the patient have an upcoming appointment? Yes  Can we respond through MyChart? No  Agent: Please be advised that Rx refills may take up to 3 business days. We ask that you follow-up with your pharmacy.

## 2023-12-17 NOTE — Assessment & Plan Note (Signed)
 For night time frequency: - avoid fluid intake 3 hours before bedtime - bladder scan WNL - fluid management and limiting to around 64oz/day

## 2023-12-17 NOTE — Patient Instructions (Addendum)
 Start titration of Cimetidine 200mg  at night time for bladder discomfort.   We will contact you to schedule your cystoscopy.   Please call [phone] to schedule the earliest appointment for pelvic floor PT.  Start squatting position for bowel movements and urination.   We discussed the symptoms of overactive bladder (OAB), which include urinary urgency, urinary frequency, night-time urination, with or without urge incontinence.  We discussed management including behavioral therapy (decreasing bladder irritants by following a bladder diet, urge suppression strategies, timed voids, bladder retraining), physical therapy, medication; and for refractory cases posterior tibial nerve stimulation, sacral neuromodulation, and intravesical botulinum toxin injection.   Attempt fluid management.

## 2023-12-18 ENCOUNTER — Ambulatory Visit: Admitting: Dermatology

## 2023-12-19 ENCOUNTER — Encounter: Payer: Self-pay | Admitting: Obstetrics

## 2023-12-19 MED ORDER — MEDROXYPROGESTERONE ACETATE 150 MG/ML IM SUSP: 150.0000 mg | INTRAMUSCULAR | 0 refills | Status: DC

## 2023-12-19 NOTE — Telephone Encounter (Signed)
 Requested medication (s) are due for refill today: yes  Requested medication (s) are on the active medication list: yes  Last refill: 12/17/23  Future visit scheduled: yes  Notes to clinic:  Medication not assigned to a protocol, review manually. Unable to refill per protocol, last refill by historical provider.      Requested Prescriptions  Pending Prescriptions Disp Refills   medroxyPROGESTERone  (DEPO-PROVERA ) 150 MG/ML injection 1 mL     Sig: Inject 1 mL (150 mg total) into the muscle every 3 (three) months.     Off-Protocol Failed - 12/19/2023  4:05 PM      Failed - Medication not assigned to a protocol, review manually.      Passed - Valid encounter within last 12 months    Recent Outpatient Visits           1 month ago GAD (generalized anxiety disorder)   [facility] Quinton Buckler, FNP   2 months ago GAD (generalized anxiety disorder)   [facility] Quinton Buckler, FNP       Future Appointments             In 1 week Jerelene Monday, Deadra Everts, MD [facility] [place]   In 1 month Abram Hoguet, Monalisa Angles, FNP [facility]   In 2 months Elta Halter, MD [facility]   In 2 months Darlene Ehlers, MD [facility], [facility]            Signed Prescriptions Disp Refills   medroxyPROGESTERone  (DEPO-PROVERA ) 150 MG/ML injection      Sig: Inject 150 mg into the muscle every 3 (three) months.     There is no refill protocol information for this order

## 2023-12-24 ENCOUNTER — Other Ambulatory Visit: Payer: Self-pay | Admitting: Obstetrics

## 2023-12-24 DIAGNOSIS — N3010 Interstitial cystitis (chronic) without hematuria: Secondary | ICD-10-CM

## 2023-12-30 DIAGNOSIS — I499 Cardiac arrhythmia, unspecified: Secondary | ICD-10-CM | POA: Insufficient documentation

## 2023-12-30 NOTE — Progress Notes (Unsigned)
 Cardiology Office Note  Date:  12/31/2023   ID:  Maria [name] 12/28/1990, MRN [id number]  PCP:  Maria Buckler, FNP   Chief Complaint  Patient presents with   New Patient (Initial Visit)    Ref by Donny Gall, FNP for irregular heart beat/palpitations. Patient wore a Zio monitor & is here for follow up of arrhythmia.     HPI:  [name] is a 33 year old woman with past medical history of Former smoker palpitations Anxiety on Lexapro , BuSpar  as needed ADHD Who presents by referral from Donny Gall for consultation of her irregular heartbeat, shortness of breath, chest pain, anxiety  Heart palpitations <1 year Chest tightening, body tingling Woke up one night, left arm numb, BP elevated, panic attack, called 911, went to see neighbor, evaluated by EMS, went to the ER 06/01/23 Got worse after then, variable heart rate, H/A, chest tightness  Started anxiety medication/lexapro  through primary care, buspar  PRN No further episodes  Stress at home, son with seizures, age 92 Followed by specialist at [facility], on medications She monitors his sleep closely with a camera Causes stress  Zio monitor performed December 2024 HR 53 - 173, average 78 bpm. 1 nonsustained VT, longest 6 beats. Rare supraventricular and ventricular ectopy. No sustained arrhythmias. No atrial fibrillation. Symptom trigger episodes do not correlate to any arrhythmia.  EKG personally reviewed by myself on todays visit EKG Interpretation Date/Time:  Monday Dec 31 2023 11:22:54 EDT Ventricular Rate:  86 PR Interval:  146 QRS Duration:  70 QT Interval:  376 QTC Calculation: 449 R Axis:   30  Text Interpretation: Normal sinus rhythm Low voltage QRS When compared with ECG of 01-Jun-2023 13:49, Nonspecific T wave abnormality, improved in Anterior leads Confirmed by Belva Boyden [id number]) on 12/31/2023 11:27:32 AM    PMH:   has a past medical history of Acute pancreatitis, Asthma, GERD  (gastroesophageal reflux disease), Hemorrhoid, varicella, Ovarian cyst, Recurrent boils, Thrombocytosis (06/09/2016), and UTI (urinary tract infection).  PSH:    Past Surgical History:  Procedure Laterality Date   BACK SURGERY     CESAREAN SECTION N/A 05/25/2016   Procedure: CESAREAN SECTION;  Surgeon: Arlee Lace, MD;  Location: [facility];  Service: Obstetrics;  Laterality: N/A;   FLEXIBLE SIGMOIDOSCOPY N/A 11/30/2021   Procedure: FLEXIBLE SIGMOIDOSCOPY;  Surgeon: Selena Daily, MD;  Location: [facility] ENDOSCOPY;  Service: Gastroenterology;  Laterality: N/A;   TONSILLECTOMY     WISDOM TOOTH EXTRACTION      Current Outpatient Medications  Medication Sig Dispense Refill   albuterol  (VENTOLIN  HFA) 108 (90 Base) MCG/ACT inhaler Inhale 2 puffs into the lungs every 6 (six) hours as needed for wheezing or shortness of breath. 8 g 2   busPIRone  (BUSPAR ) 5 MG tablet Take 1 tablet (5 mg total) by mouth 2 (two) times daily as needed (anxiety). 30 tablet 1   cetirizine (ZYRTEC) 10 MG tablet Take 1 tablet by mouth daily.     cimetidine  (TAGAMET ) 200 MG tablet TAKE 1 TABLET BY MOUTH TWICE A DAY 90 tablet 2   clindamycin  (CLEOCIN  T) 1 % external solution Apply twice daily to scalp 60 mL 2   cyclobenzaprine (FLEXERIL) 10 MG tablet Take 1 tablet by mouth every 8 (eight) hours.     escitalopram  (LEXAPRO ) 20 MG tablet TAKE 1 TABLET BY MOUTH EVERY DAY 90 tablet 1   esomeprazole  (NEXIUM ) 40 MG capsule TAKE 1 CAPSULE (40 MG TOTAL) BY MOUTH DAILY. 90 capsule 1   ketoconazole  (NIZORAL )  2 % shampoo Apply 1 Application topically as directed. Shampoo scalp and face, avoid eye,  at least 3 times weekly, let sit several minutes and rinse out 120 mL 2   lidocaine -prilocaine  (EMLA ) cream Apply 1 Application topically as needed. Apply cream to skin one hour before procedure 30 g 0   linaclotide  (LINZESS ) 290 MCG CAPS capsule Take 1 capsule (290 mcg total) by mouth daily before breakfast. 90 capsule 3    medroxyPROGESTERone  (DEPO-PROVERA ) 150 MG/ML injection Inject 1 mL (150 mg total) into the muscle every 3 (three) months. 1 mL 0   medroxyPROGESTERone  Acetate 150 MG/ML SUSY Inject 1 mL into the muscle every 3 (three) months.     montelukast  (SINGULAIR ) 10 MG tablet TAKE 1 TABLET BY MOUTH EVERYDAY AT BEDTIME 90 tablet 2   ondansetron  (ZOFRAN ) 4 MG tablet Take 1 tablet (4 mg total) by mouth every 8 (eight) hours as needed for nausea or vomiting. 30 tablet 0   No current facility-administered medications for this visit.     Allergies:   Bactrim  ds [sulfamethoxazole -trimethoprim ], Cipro [ciprofloxacin hcl], Amoxicillin, and Penicillins   Social History:  The patient  reports that she quit smoking about 10 years ago. Her smoking use included cigarettes. She has quit using smokeless tobacco. She reports current alcohol use of about 1.0 standard drink of alcohol per week. She reports that she does not use drugs.   Family History:   family history includes Heart attack in her maternal grandfather; Heart disease in her maternal grandfather; Hypertension in her maternal grandmother; Stroke in her maternal grandfather.    Review of Systems: Review of Systems  Constitutional: Negative.   HENT: Negative.    Respiratory: Negative.    Cardiovascular:  Positive for palpitations.  Gastrointestinal: Negative.   Musculoskeletal: Negative.   Neurological: Negative.   Psychiatric/Behavioral: Negative.    All other systems reviewed and are negative.  PHYSICAL EXAM: VS:  BP 110/70 (BP Location: Left Arm, Patient Position: Sitting, Cuff Size: Normal)   Pulse 86   Ht 5\' 2"  (1.575 m)   Wt 155 lb 6 oz (70.5 kg)   SpO2 98%   BMI 28.42 kg/m  , BMI Body mass index is 28.42 kg/m. GEN: Well nourished, well developed, in no acute distress HEENT: normal Neck: no JVD, carotid bruits, or masses Cardiac: RRR; no murmurs, rubs, or gallops,no edema  Respiratory:  clear to auscultation bilaterally, normal work of  breathing GI: soft, nontender, nondistended, + BS MS: no deformity or atrophy Skin: warm and dry, no rash Neuro:  Strength and sensation are intact Psych: euthymic mood, full affect  Recent Labs: 06/07/2023: TSH 1.17 08/26/2023: ALT 38; BUN 12; Creatinine, Ser 0.93; Hemoglobin 13.6; Platelets 203; Potassium 3.8; Sodium 138   Lipid Panel Lab Results  Component Value Date   CHOL 176 01/18/2023   HDL 55 01/18/2023   LDLCALC 104 (H) 01/18/2023   TRIG 81 01/18/2023    Wt Readings from Last 3 Encounters:  12/31/23 155 lb 6 oz (70.5 kg)  11/07/23 152 lb (68.9 kg)  10/08/23 150 lb 11.2 oz (68.4 kg)     ASSESSMENT AND PLAN:  Problem List Items Addressed This Visit     Irregular heartbeat - Primary   Relevant Orders   EKG 12-Lead (Completed)   Palpitations   Relevant Orders   EKG 12-Lead (Completed)   GAD (generalized anxiety disorder)   Other Visit Diagnoses       Chest pain, unspecified type  Relevant Orders   EKG 12-Lead (Completed)     Shortness of breath       Relevant Orders   EKG 12-Lead (Completed)      Palpitations Likely driven by anxiety Zio monitor reviewed, no significant arrhythmia Feels her symptoms are better controlled on Lexapro , has not had to use BuSpar  -EKG and clinical exam normal, no strong indication for additional testing such as echocardiogram - Long discussion concerning other stressors at home including her son who has autism, seizures, he sleeps in her room, she have sleeps at nighttime as she is watching him for seizures  Chest pain/shortness of breath In the setting of anxiety attack Does not happen on a regular basis Symptoms improving on Lexapro  EKG and clinical exam normal, no further testing needed Discussed correlation between anxiety attacks and her symptoms  Patient seen in consultation for Donny Gall and will be referred back to her office for ongoing care of the issues detailed above  Signed, Juanda Noon, M.D.,  Ph.D. [facility] [place], [place] [phone]

## 2023-12-31 ENCOUNTER — Encounter: Payer: Self-pay | Admitting: Cardiovascular Disease

## 2023-12-31 ENCOUNTER — Ambulatory Visit: Payer: Medicaid Other | Attending: Cardiovascular Disease | Admitting: Cardiovascular Disease

## 2023-12-31 VITALS — BP 110/70 | HR 86 | Ht 62.0 in | Wt 155.4 lb

## 2023-12-31 DIAGNOSIS — R079 Chest pain, unspecified: Secondary | ICD-10-CM | POA: Insufficient documentation

## 2023-12-31 DIAGNOSIS — I499 Cardiac arrhythmia, unspecified: Secondary | ICD-10-CM | POA: Insufficient documentation

## 2023-12-31 DIAGNOSIS — F411 Generalized anxiety disorder: Secondary | ICD-10-CM | POA: Insufficient documentation

## 2023-12-31 DIAGNOSIS — R002 Palpitations: Secondary | ICD-10-CM | POA: Insufficient documentation

## 2023-12-31 DIAGNOSIS — R0602 Shortness of breath: Secondary | ICD-10-CM | POA: Insufficient documentation

## 2023-12-31 NOTE — Patient Instructions (Signed)

## 2024-01-12 ENCOUNTER — Encounter: Payer: Self-pay | Admitting: Nurse Practitioner

## 2024-01-18 ENCOUNTER — Ambulatory Visit: Admitting: Nurse Practitioner

## 2024-01-18 NOTE — Progress Notes (Deleted)
   There were no vitals taken for this visit.   Subjective:    Patient ID: Maria [name], female    DOB: August 16, 1991, 33 y.o.   MRN: [id number]  HPI: Maria [name] is a 33 y.o. female  No chief complaint on file.   Discussed the use of AI scribe software for clinical note transcription with the patient, who gave verbal consent to proceed.  History of Present Illness          11/07/2023    2:56 PM 10/08/2023   11:02 AM 09/10/2023   10:07 AM  Depression screen PHQ 2/9  Decreased Interest 0 0 1  Down, Depressed, Hopeless 0 0 0  PHQ - 2 Score 0 0 1  Altered sleeping 2 2 3   Tired, decreased energy 1 1 1   Change in appetite 0 1 1  Feeling bad or failure about yourself  0 0 0  Trouble concentrating 1 1 0  Moving slowly or fidgety/restless 1 1 0  Suicidal thoughts 0 0 0  PHQ-9 Score 5 6 6   Difficult doing work/chores Not difficult at all Not difficult at all Somewhat difficult    Relevant past medical, surgical, family and social history reviewed and updated as indicated. Interim medical history since our last visit reviewed. Allergies and medications reviewed and updated.  Review of Systems  Per HPI unless specifically indicated above     Objective:      There were no vitals taken for this visit.  {Vitals History (Optional):23777} Wt Readings from Last 3 Encounters:  12/31/23 155 lb 6 oz (70.5 kg)  11/07/23 152 lb (68.9 kg)  10/08/23 150 lb 11.2 oz (68.4 kg)    Physical Exam Physical Exam    Results for orders placed or performed in visit on 10/23/23  POCT Urinalysis Dipstick   Collection Time: 10/23/23  3:18 PM  Result Value Ref Range   Color, UA yellow    Clarity, UA clear    Glucose, UA Negative Negative   Bilirubin, UA negative    Ketones, UA negative    Spec Grav, UA 1.015 1.010 - 1.025   Blood, UA negative    pH, UA 8.0 5.0 - 8.0   Protein, UA Negative Negative   Urobilinogen, UA 0.2 0.2 or 1.0 E.U./dL   Nitrite, UA negative     Leukocytes, UA Negative Negative   Appearance     Odor     {Labs (Optional):23779}       Assessment & Plan:   Problem List Items Addressed This Visit   None    Assessment and Plan Assessment & Plan         Follow up plan: No follow-ups on file.

## 2024-02-12 NOTE — Progress Notes (Signed)
   There were no vitals taken for this visit.   Subjective:    Patient ID: Maria [name], female    DOB: 19-Jun-1991, 33 y.o.   MRN: [id number]  HPI: Maria [name] is a 33 y.o. female  No chief complaint on file.   Discussed the use of AI scribe software for clinical note transcription with the patient, who gave verbal consent to proceed.  History of Present Illness          11/07/2023    2:56 PM 10/08/2023   11:02 AM 09/10/2023   10:07 AM  Depression screen PHQ 2/9  Decreased Interest 0 0 1  Down, Depressed, Hopeless 0 0 0  PHQ - 2 Score 0 0 1  Altered sleeping 2 2 3   Tired, decreased energy 1 1 1   Change in appetite 0 1 1  Feeling bad or failure about yourself  0 0 0  Trouble concentrating 1 1 0  Moving slowly or fidgety/restless 1 1 0  Suicidal thoughts 0 0 0  PHQ-9 Score 5 6 6   Difficult doing work/chores Not difficult at all Not difficult at all Somewhat difficult    Relevant past medical, surgical, family and social history reviewed and updated as indicated. Interim medical history since our last visit reviewed. Allergies and medications reviewed and updated.  Review of Systems  Per HPI unless specifically indicated above     Objective:     There were no vitals taken for this visit.  {Vitals History (Optional):23777} Wt Readings from Last 3 Encounters:  12/31/23 155 lb 6 oz (70.5 kg)  11/07/23 152 lb (68.9 kg)  10/08/23 150 lb 11.2 oz (68.4 kg)    Physical Exam Physical Exam    Results for orders placed or performed in visit on 10/23/23  POCT Urinalysis Dipstick   Collection Time: 10/23/23  3:18 PM  Result Value Ref Range   Color, UA yellow    Clarity, UA clear    Glucose, UA Negative Negative   Bilirubin, UA negative    Ketones, UA negative    Spec Grav, UA 1.015 1.010 - 1.025   Blood, UA negative    pH, UA 8.0 5.0 - 8.0   Protein, UA Negative Negative   Urobilinogen, UA 0.2 0.2 or 1.0 E.U./dL   Nitrite, UA negative     Leukocytes, UA Negative Negative   Appearance     Odor     {Labs (Optional):23779}       Assessment & Plan:   Problem List Items Addressed This Visit   None    Assessment and Plan Assessment & Plan         Follow up plan: No follow-ups on file.

## 2024-02-13 ENCOUNTER — Ambulatory Visit: Admitting: Nurse Practitioner

## 2024-02-13 ENCOUNTER — Encounter: Payer: Self-pay | Admitting: Nurse Practitioner

## 2024-02-13 VITALS — BP 122/78 | HR 104 | Temp 98.1°F | Resp 18 | Ht 62.0 in | Wt 158.5 lb

## 2024-02-13 DIAGNOSIS — K5909 Other constipation: Secondary | ICD-10-CM

## 2024-02-13 DIAGNOSIS — M7989 Other specified soft tissue disorders: Secondary | ICD-10-CM

## 2024-02-13 DIAGNOSIS — N3010 Interstitial cystitis (chronic) without hematuria: Secondary | ICD-10-CM | POA: Diagnosis not present

## 2024-02-13 DIAGNOSIS — R5383 Other fatigue: Secondary | ICD-10-CM

## 2024-02-13 DIAGNOSIS — F411 Generalized anxiety disorder: Secondary | ICD-10-CM | POA: Diagnosis not present

## 2024-02-13 DIAGNOSIS — Z8659 Personal history of other mental and behavioral disorders: Secondary | ICD-10-CM

## 2024-02-13 DIAGNOSIS — G43009 Migraine without aura, not intractable, without status migrainosus: Secondary | ICD-10-CM

## 2024-02-13 DIAGNOSIS — K219 Gastro-esophageal reflux disease without esophagitis: Secondary | ICD-10-CM

## 2024-02-14 ENCOUNTER — Ambulatory Visit: Payer: Self-pay | Admitting: Nurse Practitioner

## 2024-02-18 ENCOUNTER — Ambulatory Visit: Admitting: Dermatology

## 2024-02-19 ENCOUNTER — Other Ambulatory Visit: Payer: Self-pay | Admitting: Nurse Practitioner

## 2024-02-19 ENCOUNTER — Other Ambulatory Visit (HOSPITAL_COMMUNITY): Payer: Self-pay

## 2024-02-19 ENCOUNTER — Telehealth: Payer: Self-pay | Admitting: Pharmacy Technician

## 2024-02-19 DIAGNOSIS — G43009 Migraine without aura, not intractable, without status migrainosus: Secondary | ICD-10-CM

## 2024-02-19 MED ORDER — ZAVZPRET 10 MG/ACT NA SOLN: 1.0000 | NASAL | 1 refills | Status: DC | PRN

## 2024-02-19 NOTE — Telephone Encounter (Signed)
 Pharmacy Patient Advocate Encounter   Received notification from CoverMyMeds that prior authorization for Zavzpret 10MG /ACT solution is required/requested.   Insurance verification completed.   The patient is insured through [facility] [place] .   Per test claim: PA required; PA submitted to above mentioned insurance via CoverMyMeds Key/confirmation #/EOC [id number] Status is pending

## 2024-02-19 NOTE — Telephone Encounter (Signed)
 Pharmacy Patient Advocate Encounter  Received notification from [facility] Medicaid that Prior Authorization for Zavzpret 10MG /ACT solution has been DENIED.  Full denial letter will be uploaded to the media tab. See denial reason below.       PA #/Case ID/Reference #: [id number]

## 2024-02-20 ENCOUNTER — Encounter: Payer: Self-pay | Admitting: Nurse Practitioner

## 2024-02-20 ENCOUNTER — Other Ambulatory Visit: Payer: Self-pay | Admitting: Nurse Practitioner

## 2024-02-20 ENCOUNTER — Encounter: Payer: Self-pay | Admitting: Obstetrics and Gynecology

## 2024-02-20 DIAGNOSIS — G43009 Migraine without aura, not intractable, without status migrainosus: Secondary | ICD-10-CM

## 2024-02-20 LAB — CBC WITH DIFFERENTIAL/PLATELET
Absolute Lymphocytes: 2024 {cells}/uL (ref 850–3900)
Absolute Monocytes: 385 {cells}/uL (ref 200–950)
Basophils Absolute: 22 {cells}/uL (ref 0–200)
Basophils Relative: 0.4 %
Eosinophils Absolute: 138 {cells}/uL (ref 15–500)
Eosinophils Relative: 2.5 %
HCT: 43.9 % (ref 35.0–45.0)
Hemoglobin: 14.4 g/dL (ref 11.7–15.5)
MCH: 31.4 pg (ref 27.0–33.0)
MCHC: 32.8 g/dL (ref 32.0–36.0)
MCV: 95.6 fL (ref 80.0–100.0)
MPV: 10.3 fL (ref 7.5–12.5)
Monocytes Relative: 7 %
Neutro Abs: 2932 {cells}/uL (ref 1500–7800)
Neutrophils Relative %: 53.3 %
Platelets: 235 10*3/uL (ref 140–400)
RBC: 4.59 10*6/uL (ref 3.80–5.10)
RDW: 12.4 % (ref 11.0–15.0)
Total Lymphocyte: 36.8 %
WBC: 5.5 10*3/uL (ref 3.8–10.8)

## 2024-02-20 LAB — COMPREHENSIVE METABOLIC PANEL WITH GFR
AG Ratio: 2.2 (calc) (ref 1.0–2.5)
ALT: 24 U/L (ref 6–29)
AST: 19 U/L (ref 10–30)
Albumin: 4.8 g/dL (ref 3.6–5.1)
Alkaline phosphatase (APISO): 64 U/L (ref 31–125)
BUN/Creatinine Ratio: 17 (calc) (ref 6–22)
BUN: 17 mg/dL (ref 7–25)
CO2: 26 mmol/L (ref 20–32)
Calcium: 9.3 mg/dL (ref 8.6–10.2)
Chloride: 104 mmol/L (ref 98–110)
Creat: 1 mg/dL — ABNORMAL HIGH (ref 0.50–0.97)
Globulin: 2.2 g/dL (ref 1.9–3.7)
Glucose, Bld: 94 mg/dL (ref 65–99)
Potassium: 4.2 mmol/L (ref 3.5–5.3)
Sodium: 142 mmol/L (ref 135–146)
Total Bilirubin: 0.5 mg/dL (ref 0.2–1.2)
Total Protein: 7 g/dL (ref 6.1–8.1)
eGFR: 76 mL/min/{1.73_m2} (ref >=60)

## 2024-02-20 LAB — ANALYZER(R)ANA IFA WITH REFLEX TITER/PATTRN,SYS AUTOIMM PNL1
Anti Nuclear Antibody (ANA): NEGATIVE
Anticardiolipin IgA: <2 [APL'U]/mL
Anticardiolipin IgG: <2 [GPL'U]/mL
Anticardiolipin IgM: <2 [MPL'U]/mL
Beta-2 Glyco 1 IgA: <2 U/mL
Beta-2 Glyco 1 IgM: 2.1 U/mL
Beta-2 Glyco I IgG: <2 U/mL
C3 Complement: 137 mg/dL (ref 83–193)
C4 Complement: 24 mg/dL (ref 15–57)
Centromere Ab Screen: <1 AI
Chromatin (Nucleosomal) Antibody: <1 AI
Cyclic Citrullin Peptide Ab: <16 U
DNA Ab (DS) Crithidia, IFA: NEGATIVE
ENA SM Ab Ser-aCnc: <1 AI
Jo-1 Autoabs: <1 AI
MUTATED CITRULLINATED VIMENTIN (MCV) AB: <20 U/mL (ref <20)
Rheumatoid Factor (IgA): <5 U
Rheumatoid Factor (IgG): <5 U
Rheumatoid Factor (IgM): <5 U
Ribonucleic Protein(ENA) Antibody, IgG: <1 AI
SM/RNP: <1 AI
SSA (Ro) (ENA) Antibody, IgG: <1 AI
SSB (La) (ENA) Antibody, IgG: <1 AI
Scleroderma (Scl-70) (ENA) Antibody, IgG: <1 AI
Thyroperoxidase Ab SerPl-aCnc: <1 [IU]/mL (ref <9)

## 2024-02-20 LAB — HEMOGLOBIN A1C
Hgb A1c MFr Bld: 5.2 % (ref <5.7)
Mean Plasma Glucose: 103 mg/dL
eAG (mmol/L): 5.7 mmol/L

## 2024-02-20 LAB — IRON,TIBC AND FERRITIN PANEL
%SAT: 36 % (ref 16–45)
Ferritin: 38 ng/mL (ref 16–154)
Iron: 155 ug/dL (ref 40–190)
TIBC: 426 ug/dL (ref 250–450)

## 2024-02-20 LAB — TSH+FREE T4: TSH W/REFLEX TO FT4: 0.86 m[IU]/L

## 2024-02-20 LAB — B12 AND FOLATE PANEL
Folate: >24 ng/mL
Vitamin B-12: 502 pg/mL (ref 200–1100)

## 2024-02-20 LAB — VITAMIN D 25 HYDROXY (VIT D DEFICIENCY, FRACTURES): Vit D, 25-Hydroxy: 32 ng/mL (ref 30–100)

## 2024-02-20 MED ORDER — NURTEC 75 MG PO TBDP: 75.0000 mg | ORAL_TABLET | ORAL | 0 refills | Status: DC | PRN

## 2024-02-21 ENCOUNTER — Other Ambulatory Visit (HOSPITAL_COMMUNITY): Payer: Self-pay

## 2024-02-21 ENCOUNTER — Other Ambulatory Visit: Payer: Self-pay | Admitting: Obstetrics and Gynecology

## 2024-02-21 DIAGNOSIS — R3989 Other symptoms and signs involving the genitourinary system: Secondary | ICD-10-CM

## 2024-02-21 MED ORDER — LIDOCAINE-PRILOCAINE 2.5-2.5 % EX CREA: 1.0000 | TOPICAL_CREAM | CUTANEOUS | 2 refills | Status: DC | PRN

## 2024-02-26 ENCOUNTER — Other Ambulatory Visit (HOSPITAL_COMMUNITY)
Admission: RE | Admit: 2024-02-26 | Discharge: 2024-02-26 | Disposition: A | Discharge status: Home / Self Care | Source: Other Acute Inpatient Hospital | Attending: Obstetrics and Gynecology | Admitting: Obstetrics and Gynecology

## 2024-02-26 ENCOUNTER — Ambulatory Visit (INDEPENDENT_AMBULATORY_CARE_PROVIDER_SITE_OTHER)

## 2024-02-26 VITALS — BP 128/78 | HR 91

## 2024-02-26 DIAGNOSIS — R82998 Other abnormal findings in urine: Secondary | ICD-10-CM | POA: Insufficient documentation

## 2024-02-26 DIAGNOSIS — R350 Frequency of micturition: Secondary | ICD-10-CM

## 2024-02-26 LAB — POCT URINALYSIS DIP (CLINITEK)
Bilirubin, UA: NEGATIVE
Blood, UA: NEGATIVE
Glucose, UA: NEGATIVE mg/dL
Ketones, POC UA: NEGATIVE mg/dL
Leukocytes, UA: NEGATIVE
Nitrite, UA: POSITIVE — AB
POC PROTEIN,UA: NEGATIVE
Spec Grav, UA: >=1.03 — AB (ref 1.010–1.025)
Urobilinogen, UA: 1 U/dL
pH, UA: 5.5 (ref 5.0–8.0)

## 2024-02-26 NOTE — Progress Notes (Signed)
 Maria [name] is a 33 y.o. female  arrived today with UTI sx.  Per Dr. Susen protocol: A urine specimen was collected and POCT Urine was done and urine culture sent to the lab. POCT Urine was POSITIVE for Nitrates, patient also had taken AZO.

## 2024-02-26 NOTE — Patient Instructions (Signed)
 Your Urine dip that was done in office was Positive.  I am sending the urine off for culture and you can continue to take AZO over the counter for your discomfort.  We will contact you when the results are back between 3-5 days.  If a antibiotic is needed we will sent the order to the pharmacy and you will be notified. If you have any questions or concerns please feel free to call us  at [phone]

## 2024-02-27 ENCOUNTER — Other Ambulatory Visit: Payer: Self-pay

## 2024-02-27 ENCOUNTER — Encounter: Payer: Self-pay | Admitting: Obstetrics

## 2024-02-27 ENCOUNTER — Encounter: Payer: Self-pay | Admitting: Obstetrics and Gynecology

## 2024-02-27 ENCOUNTER — Emergency Department
Admission: EM | Admit: 2024-02-27 | Discharge: 2024-02-27 | Disposition: A | Discharge status: Home / Self Care | Attending: Emergency Medicine | Admitting: Emergency Medicine

## 2024-02-27 DIAGNOSIS — N3000 Acute cystitis without hematuria: Secondary | ICD-10-CM | POA: Diagnosis not present

## 2024-02-27 DIAGNOSIS — R300 Dysuria: Secondary | ICD-10-CM | POA: Diagnosis present

## 2024-02-27 LAB — COMPREHENSIVE METABOLIC PANEL WITH GFR
ALT: 49 U/L — ABNORMAL HIGH (ref 0–44)
AST: 39 U/L (ref 15–41)
Albumin: 3.8 g/dL (ref 3.5–5.0)
Alkaline Phosphatase: 58 U/L (ref 38–126)
Anion gap: 9 (ref 5–15)
BUN: 9 mg/dL (ref 6–20)
CO2: 27 mmol/L (ref 22–32)
Calcium: 9 mg/dL (ref 8.9–10.3)
Chloride: 103 mmol/L (ref 98–111)
Creatinine, Ser: 0.8 mg/dL (ref 0.44–1.00)
GFR, Estimated: >60 mL/min (ref >60)
Glucose, Bld: 75 mg/dL (ref 70–99)
Potassium: 4 mmol/L (ref 3.5–5.1)
Sodium: 139 mmol/L (ref 135–145)
Total Bilirubin: 0.8 mg/dL (ref 0.0–1.2)
Total Protein: 6.3 g/dL — ABNORMAL LOW (ref 6.5–8.1)

## 2024-02-27 LAB — URINALYSIS, ROUTINE W REFLEX MICROSCOPIC
Bilirubin Urine: NEGATIVE
Glucose, UA: NEGATIVE mg/dL
Hgb urine dipstick: NEGATIVE
Ketones, ur: NEGATIVE mg/dL
Nitrite: NEGATIVE
Protein, ur: NEGATIVE mg/dL
Specific Gravity, Urine: 1.014 (ref 1.005–1.030)
WBC, UA: >50 WBC/hpf (ref 0–5)
pH: 5 (ref 5.0–8.0)

## 2024-02-27 LAB — CBC WITH DIFFERENTIAL/PLATELET
Abs Immature Granulocytes: 0.01 K/uL (ref 0.00–0.07)
Basophils Absolute: 0 K/uL (ref 0.0–0.1)
Basophils Relative: 0 %
Eosinophils Absolute: 0.1 K/uL (ref 0.0–0.5)
Eosinophils Relative: 2 %
HCT: 40 % (ref 36.0–46.0)
Hemoglobin: 13.1 g/dL (ref 12.0–15.0)
Immature Granulocytes: 0 %
Lymphocytes Relative: 35 %
Lymphs Abs: 2.2 K/uL (ref 0.7–4.0)
MCH: 31.2 pg (ref 26.0–34.0)
MCHC: 32.8 g/dL (ref 30.0–36.0)
MCV: 95.2 fL (ref 80.0–100.0)
Monocytes Absolute: 0.4 K/uL (ref 0.1–1.0)
Monocytes Relative: 7 %
Neutro Abs: 3.5 K/uL (ref 1.7–7.7)
Neutrophils Relative %: 56 %
Platelets: 203 K/uL (ref 150–400)
RBC: 4.2 MIL/uL (ref 3.87–5.11)
RDW: 12.8 % (ref 11.5–15.5)
WBC: 6.3 K/uL (ref 4.0–10.5)
nRBC: 0 % (ref 0.0–0.2)

## 2024-02-27 LAB — POC URINE PREG, ED: Preg Test, Ur: NEGATIVE

## 2024-02-27 MED ORDER — ONDANSETRON 4 MG PO TBDP
4.0000 mg | ORAL_TABLET | Freq: Once | ORAL | Status: AC
  Administered 2024-02-27: 4 mg via ORAL
  Filled 2024-02-27: qty 1

## 2024-02-27 MED ORDER — CEFUROXIME AXETIL 250 MG PO TABS: 250.0000 mg | ORAL_TABLET | Freq: Two times a day (BID) | ORAL | 0 refills | Status: AC

## 2024-02-27 MED ORDER — CEFUROXIME AXETIL 250 MG PO TABS
250.0000 mg | ORAL_TABLET | Freq: Once | ORAL | Status: AC
  Administered 2024-02-27: 250 mg via ORAL
  Filled 2024-02-27: qty 1

## 2024-02-27 NOTE — Discharge Instructions (Addendum)
 Please take Tylenol  and ibuprofen /Advil  for your pain.  It is safe to take them together, or to alternate them every few hours.  Take up to 1000mg  of Tylenol  at a time, up to 4 times per day.  Do not take more than 4000 mg of Tylenol  in 24 hours.  For ibuprofen , take 400-600 mg, 3 - 4 times per day.  Ceftin  antibiotics twice per day for 5 days, finish all 10 pills.

## 2024-02-27 NOTE — ED Triage Notes (Signed)
 Patient states lower abdominal pain and burning with urination.

## 2024-02-27 NOTE — ED Triage Notes (Signed)
 Says she has llq pain for couple days.  Problems with urination.  Also may be pregnant--stopped birth control.

## 2024-02-27 NOTE — ED Provider Notes (Signed)
 [facility] Provider Note    Event Date/Time   First MD Initiated Contact with Patient 02/27/24 1145     (approximate)   History   Abdominal Pain   HPI  Maria [name] is a 33 y.o. female who presents to the ED for evaluation of Abdominal Pain   Patient presents to the ED for evaluation a couple days of urinary frequency, dysuria, sensation of incomplete emptying consistent with UTIs that she has had in the past.  She reports frequent UTIs but it has been greater than 1 month since her most recent episode.  No emesis or stool changes but has had some nausea.  No fevers.  Pain is starting to come up towards her LLQ/left flank in the past 1 day.   Physical Exam   Triage Vital Signs: ED Triage Vitals  Encounter Vitals Group     BP 02/27/24 1129 124/87     Girls Systolic BP Percentile --      Girls Diastolic BP Percentile --      Boys Systolic BP Percentile --      Boys Diastolic BP Percentile --      Pulse Rate 02/27/24 1129 70     Resp 02/27/24 1129 18     Temp 02/27/24 1129 98 F (36.7 C)     Temp Source 02/27/24 1129 Oral     SpO2 02/27/24 1129 100 %     Weight 02/27/24 1129 150 lb (68 kg)     Height 02/27/24 1129 5' 2 (1.575 m)     Head Circumference --      Peak Flow --      Pain Score 02/27/24 1128 10     Pain Loc --      Pain Education --      Exclude from Growth Chart --     Most recent vital signs: Vitals:   02/27/24 1129  BP: 124/87  Pulse: 70  Resp: 18  Temp: 98 F (36.7 C)  SpO2: 100%    General: Awake, no distress.  CV:  Good peripheral perfusion.  Resp:  Normal effort.  Abd:  No distention.  Mild localized suprapubic tenderness, no guarding or peritoneal features.  No CVA tenderness bilaterally MSK:  No deformity noted.  Neuro:  No focal deficits appreciated. Other:     ED Results / Procedures / Treatments   Labs (all labs ordered are listed, but only abnormal results are displayed) Labs Reviewed   URINALYSIS, ROUTINE W REFLEX MICROSCOPIC - Abnormal; Notable for the following components:      Result Value   Color, Urine YELLOW (*)    APPearance HAZY (*)    Leukocytes,Ua MODERATE (*)    Bacteria, UA RARE (*)    All other components within normal limits  COMPREHENSIVE METABOLIC PANEL WITH GFR - Abnormal; Notable for the following components:   Total Protein 6.3 (*)    ALT 49 (*)    All other components within normal limits  URINE CULTURE  CBC WITH DIFFERENTIAL/PLATELET  POC URINE PREG, ED  POC URINE PREG, ED    EKG   RADIOLOGY   Official radiology report(s): No results found.  PROCEDURES and INTERVENTIONS:  Procedures  Medications  cefUROXime  (CEFTIN ) tablet 250 mg (250 mg Oral Given 02/27/24 1230)  ondansetron  (ZOFRAN -ODT) disintegrating tablet 4 mg (4 mg Oral Given 02/27/24 1230)     IMPRESSION / MDM / ASSESSMENT AND PLAN / ED COURSE  I reviewed the triage vital signs and  the nursing notes.  Differential diagnosis includes, but is not limited to, cystitis, pyelonephritis, sepsis, PID, AKI, ureteral stone, ovarian torsion or symptomatic or hemorrhagic cyst  {Patient presents with symptoms of an acute illness or injury that is potentially life-threatening.  Patient presents with signs of acute cystitis suitable for outpatient management with oral antibiotics.  No signs of sepsis or systemic illness.  Normal vitals and blood work with CBC and metabolic panel are reassuring.  Not pregnant.  Urine is sent for culture and she is started on Ceftin .  Fortuitously she has follow-up with her urologist as an outpatient tomorrow that was already routinely scheduled.  We discussed antibiotics, ED return precautions.      FINAL CLINICAL IMPRESSION(S) / ED DIAGNOSES   Final diagnoses:  Acute cystitis without hematuria     Rx / DC Orders   ED Discharge Orders          Ordered    cefUROXime  (CEFTIN ) 250 MG tablet  2 times daily with meals        02/27/24 1224              Note:  This document was prepared using Dragon voice recognition software and may include unintentional dictation errors.   Claudene Rover, MD 02/27/24 1239

## 2024-02-28 ENCOUNTER — Other Ambulatory Visit: Admitting: Obstetrics

## 2024-02-29 ENCOUNTER — Ambulatory Visit: Payer: Self-pay | Admitting: Obstetrics and Gynecology

## 2024-02-29 LAB — URINE CULTURE
Culture: 60000 — AB
Culture: 20000 — AB

## 2024-03-05 ENCOUNTER — Ambulatory Visit: Admitting: Nurse Practitioner

## 2024-03-05 NOTE — Progress Notes (Deleted)
 LMP 02/11/2024    Subjective:    Patient ID: Maria [name], female    DOB: [birth year], 33 y.o.   MRN: [id number]  HPI: Maria [name] is a 33 y.o. female presenting today with reflux concerns.  Reports food triggers? Sx:  Treatment:   - Currently taking Nexium  for sx 30-60 min beofre a meal   Prilosec? Protonix ? Or   Add in pepcid       02/13/2024    2:36 PM 11/07/2023    2:56 PM 10/08/2023   11:02 AM  Depression screen PHQ 2/9  Decreased Interest 0 0 0  Down, Depressed, Hopeless 0 0 0  PHQ - 2 Score 0 0 0  Altered sleeping 2 2 2   Tired, decreased energy 3 1 1   Change in appetite 0 0 1  Feeling bad or failure about yourself  0 0 0  Trouble concentrating 3 1 1   Moving slowly or fidgety/restless 3 1 1   Suicidal thoughts 0 0 0  PHQ-9 Score 11 5 6   Difficult doing work/chores Somewhat difficult Not difficult at all Not difficult at all    Relevant past medical, surgical, family and social history reviewed and updated as indicated. Interim medical history since our last visit reviewed. Allergies and medications reviewed and updated.  Review of Systems  Per HPI unless specifically indicated above     Objective:     LMP 02/11/2024   {Vitals History (Optional):23777} Wt Readings from Last 3 Encounters:  02/27/24 150 lb (68 kg)  02/13/24 158 lb 8 oz (71.9 kg)  12/31/23 155 lb 6 oz (70.5 kg)    Physical Exam   Results for orders placed or performed during the hospital encounter of 02/27/24  Urine Culture   Collection Time: 02/27/24 10:04 AM   Specimen: Urine, Clean Catch  Result Value Ref Range   Specimen Description      URINE, CLEAN CATCH Performed at [facility], [address], [place], [place] [address]    Special Requests      NONE Performed at [facility], [address], [place], [place] [address]    Culture 20,000 COLONIES/mL KLEBSIELLA PNEUMONIAE (A)    Report Status 02/29/2024 FINAL    Organism ID,  Bacteria KLEBSIELLA PNEUMONIAE (A)       Susceptibility   Klebsiella pneumoniae - MIC*    AMPICILLIN >=32 RESISTANT Resistant     CEFAZOLIN <=4 SENSITIVE Sensitive     CEFEPIME <=0.12 SENSITIVE Sensitive     CEFTRIAXONE  <=0.25 SENSITIVE Sensitive     CIPROFLOXACIN <=0.25 SENSITIVE Sensitive     GENTAMICIN  <=1 SENSITIVE Sensitive     IMIPENEM <=0.25 SENSITIVE Sensitive     NITROFURANTOIN 128 RESISTANT Resistant     TRIMETH /SULFA  <=20 SENSITIVE Sensitive     AMPICILLIN/SULBACTAM 8 SENSITIVE Sensitive     PIP/TAZO <=4 SENSITIVE Sensitive ug/mL    * 20,000 COLONIES/mL KLEBSIELLA PNEUMONIAE  Urinalysis, Routine w reflex microscopic -Urine, Clean Catch   Collection Time: 02/27/24 10:04 AM  Result Value Ref Range   Color, Urine YELLOW (A) YELLOW   APPearance HAZY (A) CLEAR   Specific Gravity, Urine 1.014 1.005 - 1.030   pH 5.0 5.0 - 8.0   Glucose, UA NEGATIVE NEGATIVE mg/dL   Hgb urine dipstick NEGATIVE NEGATIVE   Bilirubin Urine NEGATIVE NEGATIVE   Ketones, ur NEGATIVE NEGATIVE mg/dL   Protein, ur NEGATIVE NEGATIVE mg/dL   Nitrite NEGATIVE NEGATIVE   Leukocytes,Ua MODERATE (A) NEGATIVE   RBC / HPF 0-5 0 -  5 RBC/hpf   WBC, UA >50 0 - 5 WBC/hpf   Bacteria, UA RARE (A) NONE SEEN   Squamous Epithelial / HPF 0-5 0 - 5 /HPF   Mucus PRESENT   POC urine preg, ED (not at [facility])   Collection Time: 02/27/24 10:11 AM  Result Value Ref Range   Preg Test, Ur NEGATIVE NEGATIVE  CBC with Differential   Collection Time: 02/27/24 11:31 AM  Result Value Ref Range   WBC 6.3 4.0 - 10.5 K/uL   RBC 4.20 3.87 - 5.11 MIL/uL   Hemoglobin 13.1 12.0 - 15.0 g/dL   HCT 59.9 63.9 - 53.9 %   MCV 95.2 80.0 - 100.0 fL   MCH 31.2 26.0 - 34.0 pg   MCHC 32.8 30.0 - 36.0 g/dL   RDW 87.1 88.4 - 84.4 %   Platelets 203 150 - 400 K/uL   nRBC 0.0 0.0 - 0.2 %   Neutrophils Relative % 56 %   Neutro Abs 3.5 1.7 - 7.7 K/uL   Lymphocytes Relative 35 %   Lymphs Abs 2.2 0.7 - 4.0 K/uL   Monocytes Relative 7 %    Monocytes Absolute 0.4 0.1 - 1.0 K/uL   Eosinophils Relative 2 %   Eosinophils Absolute 0.1 0.0 - 0.5 K/uL   Basophils Relative 0 %   Basophils Absolute 0.0 0.0 - 0.1 K/uL   Immature Granulocytes 0 %   Abs Immature Granulocytes 0.01 0.00 - 0.07 K/uL  Comprehensive metabolic panel   Collection Time: 02/27/24 11:31 AM  Result Value Ref Range   Sodium 139 135 - 145 mmol/L   Potassium 4.0 3.5 - 5.1 mmol/L   Chloride 103 98 - 111 mmol/L   CO2 27 22 - 32 mmol/L   Glucose, Bld 75 70 - 99 mg/dL   BUN 9 6 - 20 mg/dL   Creatinine, Ser 9.19 0.44 - 1.00 mg/dL   Calcium 9.0 8.9 - 89.6 mg/dL   Total Protein 6.3 (L) 6.5 - 8.1 g/dL   Albumin 3.8 3.5 - 5.0 g/dL   AST 39 15 - 41 U/L   ALT 49 (H) 0 - 44 U/L   Alkaline Phosphatase 58 38 - 126 U/L   Total Bilirubin 0.8 0.0 - 1.2 mg/dL   GFR, Estimated >39 >39 mL/min   Anion gap 9 5 - 15   {Labs (Optional):23779}       Assessment & Plan:   Problem List Items Addressed This Visit   None    Assessment and Plan         Follow up plan: No follow-ups on file.

## 2024-03-06 ENCOUNTER — Ambulatory Visit (INDEPENDENT_AMBULATORY_CARE_PROVIDER_SITE_OTHER): Admitting: Nurse Practitioner

## 2024-03-06 ENCOUNTER — Other Ambulatory Visit (HOSPITAL_COMMUNITY)
Admission: RE | Admit: 2024-03-06 | Discharge: 2024-03-06 | Disposition: A | Discharge status: Home / Self Care | Source: Ambulatory Visit | Attending: Nurse Practitioner | Admitting: Nurse Practitioner

## 2024-03-06 ENCOUNTER — Encounter: Payer: Self-pay | Admitting: Nurse Practitioner

## 2024-03-06 VITALS — BP 130/72 | HR 85 | Resp 16 | Ht 62.0 in | Wt 161.0 lb

## 2024-03-06 DIAGNOSIS — K640 First degree hemorrhoids: Secondary | ICD-10-CM

## 2024-03-06 DIAGNOSIS — B379 Candidiasis, unspecified: Secondary | ICD-10-CM | POA: Insufficient documentation

## 2024-03-06 DIAGNOSIS — K219 Gastro-esophageal reflux disease without esophagitis: Secondary | ICD-10-CM | POA: Diagnosis not present

## 2024-03-06 DIAGNOSIS — R300 Dysuria: Secondary | ICD-10-CM

## 2024-03-06 LAB — POCT URINALYSIS DIPSTICK
Appearance: NORMAL
Bilirubin, UA: NEGATIVE
Blood, UA: NEGATIVE
Glucose, UA: NEGATIVE
Ketones, UA: NEGATIVE
Leukocytes, UA: NEGATIVE
Nitrite, UA: NEGATIVE
Protein, UA: NEGATIVE
Spec Grav, UA: 1.01 (ref 1.010–1.025)
Urobilinogen, UA: 0.2 U/dL
pH, UA: 6 (ref 5.0–8.0)

## 2024-03-06 LAB — POCT URINE PREGNANCY: Preg Test, Ur: NEGATIVE

## 2024-03-06 MED ORDER — HYDROCORTISONE (PERIANAL) 2.5 % EX CREA: 1.0000 | TOPICAL_CREAM | Freq: Two times a day (BID) | CUTANEOUS | 0 refills | Status: AC

## 2024-03-06 MED ORDER — FAMOTIDINE 40 MG PO TABS: 40.0000 mg | ORAL_TABLET | Freq: Every day | ORAL | 1 refills | Status: DC

## 2024-03-06 NOTE — Assessment & Plan Note (Addendum)
 Continue with Nexium  in the morning and begin taking the Famotidine  40mg  at night. New referral for GI placed.

## 2024-03-06 NOTE — Telephone Encounter (Signed)
 I called patient she stated she went to her PCP, they will send her u/a and swab for culture. I encouraged her to call the office if she needs anything else from us  before her appt 7/28.

## 2024-03-06 NOTE — Progress Notes (Signed)
 /  BP 130/72   Pulse 85   Resp 16   Ht 5' 2 (1.575 m)   Wt 161 lb (73 kg)   LMP 02/11/2024   SpO2 94%   BMI 29.45 kg/m    Subjective:    Patient ID: Maria [name], female    DOB: 08/15/1991, 33 y.o.   MRN: [id number]  HPI: Maria [name] is a 33 y.o. female presenting with reflux concerns.Patient currently takes Nexium , but reports that it's not effective in treating her symptoms. She reports that she avoids caffeine and acidic foods. She states her reflux is worse at night. Patient was followed by GI, but reports her provider is no longer there. Patient interested in a new referral to GI. Patient also reports dysuria symptoms and hemorrhoids with no active bleeding .  Dysuria: -Recent UTI (02/27/2024) -Finished abx 2 day but still having dysuria and urinary frequency  -Followed by urology  -Lower abdominal pain (more so on right side)- pelvic area  -Flank pain -Vaginal itchiness  -Urine dip performed in office today  -Vaginal swab performed today  Hemorrhoids: -Patient reports she gets hemorrhoids due to constipation  -Reports no active bleeding  -Reports using Anusol  cream and was effective in treating her sx -Will send in cream        02/13/2024    2:36 PM 11/07/2023    2:56 PM 10/08/2023   11:02 AM  Depression screen PHQ 2/9  Decreased Interest 0 0 0  Down, Depressed, Hopeless 0 0 0  PHQ - 2 Score 0 0 0  Altered sleeping 2 2 2   Tired, decreased energy 3 1 1   Change in appetite 0 0 1  Feeling bad or failure about yourself  0 0 0  Trouble concentrating 3 1 1   Moving slowly or fidgety/restless 3 1 1   Suicidal thoughts 0 0 0  PHQ-9 Score 11 5 6   Difficult doing work/chores Somewhat difficult Not difficult at all Not difficult at all    Relevant past medical, surgical, family and social history reviewed and updated as indicated. Interim medical history since our last visit reviewed. Allergies and medications reviewed and updated.  Review of  Systems  Constitutional: Negative for fever or weight change.  Respiratory: Negative for cough and shortness of breath.   Cardiovascular: Negative for chest pain or palpitations.  Gastrointestinal: Reports right lower abdominal pain, reports heartburn and constipation. Denies vomiting.  Musculoskeletal: Negative for gait problem or joint swelling.  Skin: Negative for rash.  Neurological: Negative for dizziness or headache.  No other specific complaints in a complete review of systems (except as listed in HPI above).      Objective:     BP 130/72   Pulse 85   Resp 16   Ht 5' 2 (1.575 m)   Wt 161 lb (73 kg)   LMP 02/11/2024   SpO2 94%   BMI 29.45 kg/m    Wt Readings from Last 3 Encounters:  03/06/24 161 lb (73 kg)  02/27/24 150 lb (68 kg)  02/13/24 158 lb 8 oz (71.9 kg)      Constitutional: Patient appears well-developed and well-nourished.  No distress.  HEENT: head atraumatic, normocephalic, pupils equal and reactive to light, neck supple, throat within normal limits Cardiovascular: Normal rate, regular rhythm and normal heart sounds.  No murmur heard. No BLE edema. Pulmonary/Chest: Effort normal and breath sounds normal. No respiratory distress. Abdominal: Bowel sounds WNL. Soft.  There is tenderness.to suprapubic region.  Psychiatric: Patient has  a normal mood and affect. behavior is normal. Judgment and thought content normal.  Results for orders placed or performed in visit on 03/06/24  POCT urinalysis dipstick   Collection Time: 03/06/24  1:16 PM  Result Value Ref Range   Color, UA Yellow    Clarity, UA Clear    Glucose, UA Negative Negative   Bilirubin, UA Negative    Ketones, UA Negative    Spec Grav, UA 1.010 1.010 - 1.025   Blood, UA Negative    pH, UA 6.0 5.0 - 8.0   Protein, UA Negative Negative   Urobilinogen, UA 0.2 0.2 or 1.0 E.U./dL   Nitrite, UA Negative    Leukocytes, UA Negative Negative   Appearance Normal    Odor None   POCT urine pregnancy    Collection Time: 03/06/24  1:30 PM  Result Value Ref Range   Preg Test, Ur Negative Negative          Assessment & Plan:   Problem List Items Addressed This Visit       Digestive   Gastroesophageal reflux disease without esophagitis   Continue with Nexium  in the morning and begin taking the Famotidine  40mg  at night. New referral for GI placed.       Relevant Medications   famotidine  (PEPCID ) 40 MG tablet   Other Relevant Orders   Ambulatory referral to Gastroenterology   Other Visit Diagnoses       Dysuria    -  Primary   Urine dip performed in office- unremarkable. Sending off for culture. Vaginal swab also sent off. Urine preg was negative   Relevant Orders   Urine Culture   POCT urinalysis dipstick (Completed)   POCT urine pregnancy (Completed)     Yeast infection       Vaginal swab performed and sent off. Will notify patient of results.   Relevant Orders   Cervicovaginal ancillary only     Grade I hemorrhoids       Anusol  cream sent to pharmacy   Relevant Medications   hydrocortisone  (ANUSOL -HC) 2.5 % rectal cream                 Follow up plan: Return if symptoms worsen or fail to improve.   I have reviewed this encounter including the documentation in this note and/or discussed this patient with the provider, Aislinn Womack, SNP, I am certifying that I agree with the content of this note as supervising/preceptor nurse practitioner.  Mliss Spray, FNP-C [facility] 03/06/2024, 2:57 PM

## 2024-03-07 LAB — URINE CULTURE
MICRO NUMBER:: 16712818
SPECIMEN QUALITY:: ADEQUATE

## 2024-03-10 ENCOUNTER — Ambulatory Visit: Payer: Self-pay | Admitting: Nurse Practitioner

## 2024-03-10 DIAGNOSIS — B9689 Other specified bacterial agents as the cause of diseases classified elsewhere: Secondary | ICD-10-CM

## 2024-03-10 DIAGNOSIS — B379 Candidiasis, unspecified: Secondary | ICD-10-CM

## 2024-03-10 LAB — CERVICOVAGINAL ANCILLARY ONLY
Bacterial Vaginitis (gardnerella): POSITIVE — AB
Candida Glabrata: POSITIVE — AB
Candida Vaginitis: POSITIVE — AB
Chlamydia: NEGATIVE
Comment: NEGATIVE
Comment: NEGATIVE
Comment: NEGATIVE
Comment: NEGATIVE
Comment: NEGATIVE
Comment: NORMAL
Neisseria Gonorrhea: NEGATIVE
Trichomonas: NEGATIVE

## 2024-03-10 MED ORDER — FLUCONAZOLE 150 MG PO TABS: 150.0000 mg | ORAL_TABLET | ORAL | 0 refills | Status: AC | PRN

## 2024-03-10 MED ORDER — METRONIDAZOLE 500 MG PO TABS: 500.0000 mg | ORAL_TABLET | Freq: Two times a day (BID) | ORAL | 0 refills | Status: AC

## 2024-03-14 ENCOUNTER — Other Ambulatory Visit (HOSPITAL_COMMUNITY): Payer: Self-pay

## 2024-03-17 ENCOUNTER — Ambulatory Visit: Admitting: Obstetrics

## 2024-03-18 ENCOUNTER — Other Ambulatory Visit: Payer: Self-pay | Admitting: Dermatology

## 2024-03-18 DIAGNOSIS — L739 Follicular disorder, unspecified: Secondary | ICD-10-CM

## 2024-03-23 ENCOUNTER — Other Ambulatory Visit: Payer: Self-pay | Admitting: Nurse Practitioner

## 2024-03-25 NOTE — Telephone Encounter (Signed)
 Requested medication (s) are due for refill today: yes  Requested medication (s) are on the active medication list: yes  Last refill:  N/A  Future visit scheduled No  Notes to clinic:   Medication not assigned to a protocol, review manually      Requested Prescriptions  Pending Prescriptions Disp Refills   medroxyPROGESTERone  Acetate 150 MG/ML SUSY [Pharmacy Med Name: MEDROXYPROGESTERONE  150 MG/ML]      Sig: INJECT 1 ML (150 MG TOTAL) INTO THE MUSCLE EVERY 3 (THREE) MONTHS     Off-Protocol Failed - 03/25/2024  8:57 AM      Failed - Medication not assigned to a protocol, review manually.      Passed - Valid encounter within last 12 months    Recent Outpatient Visits           2 weeks ago Dysuria   [facility] Gareth Mliss FALCON, FNP   1 month ago GAD (generalized anxiety disorder)   [facility] Gareth Mliss FALCON, FNP   4 months ago GAD (generalized anxiety disorder)   [facility] Gareth Mliss FALCON, FNP   5 months ago GAD (generalized anxiety disorder)   [facility] Gareth Mliss FALCON, FNP       Future Appointments             Tomorrow Hester Alm BROCKS, MD [facility]   In 2 months Guadlupe Lianne DASEN, MD [facility]ro[facility]cology at [facility], [facility]

## 2024-03-26 ENCOUNTER — Ambulatory Visit: Admitting: Dermatology

## 2024-04-08 ENCOUNTER — Other Ambulatory Visit: Payer: Self-pay | Admitting: Nurse Practitioner

## 2024-04-09 NOTE — Telephone Encounter (Signed)
 Requested medication (s) are due for refill today - expired Rx  Requested medication (s) are on the active medication list -yes  Future visit scheduled -no  Last refill: 12/23/19  Notes to clinic: historical medication  Requested Prescriptions  Pending Prescriptions Disp Refills   cetirizine (ZYRTEC) 10 MG tablet [Pharmacy Med Name: CETIRIZINE HCL 10 MG TABLET] 30 tablet 2    Sig: TAKE 1 TABLET BY MOUTH EVERY DAY IN THE EVENING     Ear, Nose, and Throat:  Antihistamines 2 Passed - 04/09/2024  3:34 PM      Passed - Cr in normal range and within 360 days    Creat  Date Value Ref Range Status  02/13/2024 1.00 (H) 0.50 - 0.97 mg/dL Final   Creatinine, Ser  Date Value Ref Range Status  02/27/2024 0.80 0.44 - 1.00 mg/dL Final         Passed - Valid encounter within last 12 months    Recent Outpatient Visits           1 month ago Dysuria   [facility] Gareth Mliss FALCON, FNP   1 month ago GAD (generalized anxiety disorder)   [facility] Gareth Mliss FALCON, FNP   5 months ago GAD (generalized anxiety disorder)   [facility] Gareth Mliss FALCON, FNP   6 months ago GAD (generalized anxiety disorder)   [facility] Gareth Mliss FALCON, FNP       Future Appointments             In 1 month Hester Alm BROCKS, MD [facility]   In 2 months Guadlupe Lianne DASEN, MD [facility]cology at [facility], [facility]               Requested Prescriptions  Pending Prescriptions Disp Refills   cetirizine (ZYRTEC) 10 MG tablet [Pharmacy Med Name: CETIRIZINE HCL 10 MG TABLET] 30 tablet 2    Sig: TAKE 1 TABLET BY MOUTH EVERY DAY IN THE EVENING     Ear, Nose, and Throat:  Antihistamines 2 Passed - 04/09/2024  3:34 PM      Passed - Cr in normal range and within 360 days    Creat  Date Value Ref Range Status  02/13/2024 1.00 (H) 0.50 - 0.97 mg/dL Final   Creatinine, Ser   Date Value Ref Range Status  02/27/2024 0.80 0.44 - 1.00 mg/dL Final         Passed - Valid encounter within last 12 months    Recent Outpatient Visits           1 month ago Dysuria   [facility] Gareth Mliss FALCON, FNP   1 month ago GAD (generalized anxiety disorder)   [facility] Gareth Mliss FALCON, FNP   5 months ago GAD (generalized anxiety disorder)   [facility] Gareth Mliss FALCON, FNP   6 months ago GAD (generalized anxiety disorder)   [facility] Gareth Mliss FALCON, FNP       Future Appointments             In 1 month Hester Alm BROCKS, MD [facility]   In 2 months Guadlupe Lianne DASEN, MD [facility]cology at [facility], [facility]

## 2024-05-01 ENCOUNTER — Other Ambulatory Visit: Payer: Self-pay | Admitting: Nurse Practitioner

## 2024-05-01 DIAGNOSIS — K640 First degree hemorrhoids: Secondary | ICD-10-CM

## 2024-05-02 NOTE — Telephone Encounter (Signed)
 Requested medications are due for refill today.  unsure  Requested medications are on the active medications list.  yes  Last refill. 03/06/2024 30g 0 rf  Future visit scheduled.   no  Notes to clinic.  Refill not delegated.    Requested Prescriptions  Pending Prescriptions Disp Refills   hydrocortisone  (ANUSOL -HC) 2.5 % rectal cream [Pharmacy Med Name: HYDROCORTISONE  2.5% RECTAL CRM] 30 g 0    Sig: Place 1 Application rectally 2 (two) times daily.     Not Delegated - Over the Counter: OTC 2 Failed - 05/02/2024  9:58 AM      Failed - This refill cannot be delegated      Passed - Valid encounter within last 12 months    Recent Outpatient Visits           1 month ago Dysuria   [facility] Gareth Mliss FALCON, FNP   2 months ago GAD (generalized anxiety disorder)   [facility] Gareth Mliss FALCON, FNP   5 months ago GAD (generalized anxiety disorder)   [facility] Gareth Mliss FALCON, FNP   6 months ago GAD (generalized anxiety disorder)   [facility] Gareth Mliss FALCON, FNP       Future Appointments             In 1 month Hester Alm BROCKS, MD [facility] [place]   In 1 month Guadlupe Lianne DASEN, MD [facility] at [facility], [facility]

## 2024-05-13 ENCOUNTER — Encounter: Payer: Self-pay | Admitting: Obstetrics

## 2024-05-14 ENCOUNTER — Ambulatory Visit (INDEPENDENT_AMBULATORY_CARE_PROVIDER_SITE_OTHER)

## 2024-05-14 ENCOUNTER — Other Ambulatory Visit (HOSPITAL_COMMUNITY)
Admission: RE | Admit: 2024-05-14 | Discharge: 2024-05-14 | Disposition: A | Discharge status: Home / Self Care | Source: Ambulatory Visit | Attending: Obstetrics and Gynecology | Admitting: Obstetrics and Gynecology

## 2024-05-14 ENCOUNTER — Ambulatory Visit: Payer: Self-pay

## 2024-05-14 VITALS — BP 124/86 | HR 85

## 2024-05-14 DIAGNOSIS — R102 Pelvic and perineal pain: Secondary | ICD-10-CM

## 2024-05-14 DIAGNOSIS — R300 Dysuria: Secondary | ICD-10-CM

## 2024-05-14 DIAGNOSIS — N9489 Other specified conditions associated with female genital organs and menstrual cycle: Secondary | ICD-10-CM | POA: Diagnosis present

## 2024-05-14 DIAGNOSIS — R350 Frequency of micturition: Secondary | ICD-10-CM | POA: Diagnosis not present

## 2024-05-14 LAB — POCT URINALYSIS DIP (CLINITEK)
Bilirubin, UA: NEGATIVE
Blood, UA: NEGATIVE
Glucose, UA: NEGATIVE mg/dL
Ketones, POC UA: NEGATIVE mg/dL
Leukocytes, UA: NEGATIVE
Nitrite, UA: NEGATIVE
POC PROTEIN,UA: NEGATIVE
Spec Grav, UA: 1.025 (ref 1.010–1.025)
Urobilinogen, UA: 0.2 U/dL
pH, UA: 5.5 (ref 5.0–8.0)

## 2024-05-14 LAB — POCT PREGNANCY, URINE

## 2024-05-14 NOTE — Progress Notes (Signed)
 Maria [name] arrived today with dysuria, urinary frequency, and urinary urgency. Patient is not experiencing fever, unstable vitals and/or one-sided back flank pain. Patient has not had a recent hospitalization due to UTI.  Last visit in the office was 02/28/2024.  Per protocol:   The most recent Urinalysis completed on 03-06-2024 and was normal.  Last Creatinine level  Lab Results  Component Value Date   CREATININE 0.80 02/27/2024    An urine specimen was collected and POCT urinalysis completed.  [x] A cath specimen was collected due to patient's current condition, symptoms or post-procedural state.  Total urine output by catheter is   Output by Drain (mL) 05/12/24 0701 - 05/12/24 1900 05/12/24 1901 - 05/13/24 0700 05/13/24 0701 - 05/13/24 1900 05/13/24 1901 - 05/14/24 0700 05/14/24 0701 - 05/14/24 9160  Patient has no LDAs of requested type attached.    SABRA    POCT Urine results is normal.  Urine micro was not sent per protocol for abnormal urinalysis.  Urine culture was sent per protocol for abnormal urinalysis.     [] Pt was notified of positive urine results and plan for additional urine testing. We will contact you within the next 3-4 days with these results.   [x] No Prescription was sent to your pharmacy.  The additional testing will indicate if a prescription is needed.   [] Patient was notified of abnormal urine results. The following prescription is sent to your preferred pharmacy.  []  Macrobid 100mg  #10 1 tablet by mouth twice daily with food for 5 days      []  Bactrim  DS 800-160mg  #6 1 tablet by mouth twice daily for 3 days        []  Due to your current medication allergies, an alternate prescription was discussed with your provider and will be prescribed and sent to your pharmacy.  [x] You can take over the counter AZO two tablets up to three times a day for two days.  Take AZO tablets with a full glass of water. AZO will turn your urine orange, this is normal.   [x] The patient  was notified of negative urine results.  If symptoms persist, you may take over the counter AZO two tablets up to three times a day for two days.  AZO will turn your urine orange, this is normal.  Contact the office back to schedule an appointment if your symptoms persist or worsen or you develop additional symptoms.       CC'd note to patient's provider.

## 2024-05-14 NOTE — Progress Notes (Unsigned)
 CYSTOSCOPY  CC:  This is a 33 y.o. with bladder pain who presents today for cystoscopy.  Bladder instillations without relief. Cimetidine  with *** Pelvic floor PT referral *** Continues Linzess  for constipation Negative urine culture 03/06/24 with negative UA despite symptoms.  Pending urine culture and nuswab 05/14/24  Results for orders placed or performed in visit on 05/14/24  Cervicovaginal ancillary only   Collection Time: 05/14/24  9:10 AM  Result Value Ref Range   Negative     Negative    POCT URINALYSIS DIP (CLINITEK)   Collection Time: 05/14/24  9:15 AM  Result Value Ref Range   Color, UA yellow yellow   Clarity, UA clear clear   Glucose, UA negative negative mg/dL   Bilirubin, UA negative negative   Ketones, POC UA negative negative mg/dL   Spec Grav, UA 8.974 8.989 - 1.025   Blood, UA negative negative   pH, UA 5.5 5.0 - 8.0   POC PROTEIN,UA negative negative, trace   Urobilinogen, UA 0.2 0.2 or 1.0 E.U./dL   Nitrite, UA Negative Negative   Leukocytes, UA Negative Negative  POCT Pregnancy, Urine   Collection Time: 05/14/24 12:50 PM  Result Value Ref Range   Negative      There were no vitals taken for this visit.  CYSTOSCOPY: A time out was performed.  The periurethral area was prepped and draped in a sterile manner.  2% lidocaine  jetpack was inserted at the urethral meatus. The urethra and bladder were visualized with flexible cystoscope.  She had {Desc; normal/diminished:12763} urethral coaptation and {Desc; normal/abnormal w/wildcard:19060} urethral mucosa.  She had {Desc; normal/abnormal w/wildcard:19060} bladder mucosa. She had bilateral clear efflux from both ureteral orifices.  She had no squamous metaplasia at the trigone, no trabeculations, cellules or diverticuli.     ASSESSMENT:  33 y.o. with {cysto problems:24783}. Cystoscopy today is {Desc; normal/abnormal w/wildcard:19060}.  PLAN:  Follow-up to discuss findings and treatment options.  All  questions answered and post-procedures instructions were given  Lianne ONEIDA Gillis, MD

## 2024-05-15 ENCOUNTER — Encounter: Payer: Self-pay | Admitting: Obstetrics

## 2024-05-15 ENCOUNTER — Ambulatory Visit: Admitting: Obstetrics

## 2024-05-15 ENCOUNTER — Ambulatory Visit: Payer: Self-pay | Admitting: Obstetrics

## 2024-05-15 VITALS — BP 124/77 | HR 87

## 2024-05-15 DIAGNOSIS — N3010 Interstitial cystitis (chronic) without hematuria: Secondary | ICD-10-CM | POA: Diagnosis not present

## 2024-05-15 DIAGNOSIS — R3989 Other symptoms and signs involving the genitourinary system: Secondary | ICD-10-CM | POA: Diagnosis not present

## 2024-05-15 DIAGNOSIS — F419 Anxiety disorder, unspecified: Secondary | ICD-10-CM | POA: Diagnosis not present

## 2024-05-15 LAB — CERVICOVAGINAL ANCILLARY ONLY
Candida Glabrata: NEGATIVE
Comment: NEGATIVE
Comment: NEGATIVE
Comment: NEGATIVE

## 2024-05-15 LAB — POCT URINALYSIS DIP (CLINITEK)
Bilirubin, UA: NEGATIVE
Glucose, UA: NEGATIVE mg/dL
Ketones, POC UA: NEGATIVE mg/dL
Leukocytes, UA: NEGATIVE
Nitrite, UA: NEGATIVE
POC PROTEIN,UA: NEGATIVE
Spec Grav, UA: 1.025 (ref 1.010–1.025)
Urobilinogen, UA: 0.2 U/dL
pH, UA: 6 (ref 5.0–8.0)

## 2024-05-15 LAB — URINE CULTURE: Culture: NO GROWTH

## 2024-05-15 MED ORDER — LIDOCAINE HCL URETHRAL/MUCOSAL 2 % EX GEL
1.0000 | Freq: Once | CUTANEOUS | Status: AC
  Administered 2024-05-15: 1 via URETHRAL

## 2024-05-15 MED ORDER — LIDOCAINE-PRILOCAINE 2.5-2.5 % EX CREA: TOPICAL_CREAM | CUTANEOUS | 2 refills | Status: AC

## 2024-05-15 MED ORDER — HYDROXYZINE PAMOATE 25 MG PO CAPS: 25.0000 mg | ORAL_CAPSULE | Freq: Every day | ORAL | 2 refills | Status: DC

## 2024-05-15 NOTE — Assessment & Plan Note (Signed)
-   worsened with stressors regarding son's seizures - h/o migraines, IBS-C on Linzess  - no relief with bladder instillation - reports that her mother underwent bladder scrapping to remove bacteria with improvement of symptoms - prior treatments include:  Valium  suppositories, flomax, amitriptyline, gabapentin, oxycodone , meloxicam , and referred to pelvic floor PT in 2021 - For irritative bladder we reviewed treatment options including altering her diet to avoid irritative beverages and foods as well as attempting to decrease stress and other exacerbating factors.  We also discussed using pyridium  and similar over-the-counter medications for pain relief as needed. We discussed the pentad of medications including Tums, an antihistamine such as Vistaril , amitriptyline, and L-arginine.  We also discussed in-office bladder instillations for pain flares, as well as cystoscopy with hydrodistention in the operating room, which can be both diagnostic and therapeutic.  - cystoscopy WNL today  - discussed stress management, baths, and encouraged pelvic floor relaxation and yoga - continue limiting caffeine and bladder irritants  - Rx cimetidine  due to difficulty sleeping, not covered by insurance per pt. Rx hydroxyzine  at bedtime, discontinue zyrtec if scalp symptoms improve along with bladder and urethral discomfort - continue Buspar  for anxiety - negative prior testing for UTI  - encouraged pt to schedule PT with information provided - Rx refill for topical lidocaine  per pt request due to relief

## 2024-05-15 NOTE — Addendum Note (Signed)
 Addended by: KRYSTAL ANDREE GAILS on: 05/15/2024 12:22 PM   Modules accepted: Orders

## 2024-05-15 NOTE — Addendum Note (Signed)
 Addended by: KRYSTAL ANDREE GAILS on: 05/15/2024 12:33 PM   Modules accepted: Orders

## 2024-05-15 NOTE — Addendum Note (Signed)
 Addended byBETHA GUADLUPE DULL T on: 05/15/2024 11:52 AM   Modules accepted: Orders, Level of Service

## 2024-05-15 NOTE — Patient Instructions (Addendum)
 Taking Care of Yourself after Urodynamics, Cystoscopy, Bulkamid Injection, or Botox Injection   Drink plenty of water for a day or two following your procedure. Try to have about 8 ounces (one cup) at a time, and do this 6 times or more per day unless you have fluid restrictitons AVOID irritative beverages such as coffee, tea, soda, alcoholic or citrus drinks for a day or two, as this may cause burning with urination.  For the first 1-2 days after the procedure, your urine may be pink or red in color. You may have some blood in your urine as a normal side effect of the procedure. Large amounts of bleeding or difficulty urinating are NOT normal. Call the nurse line if this happens or go to the nearest Emergency Room if the bleeding is heavy or you cannot urinate at all and it is after hours. If you had a Bulkamid injection in the urethra and need to be catheterized, ask for a pediatric catheter to be used (size 10 or 12-French) so the material is not pushed out of place.   You may experience some discomfort or a burning sensation with urination after having this procedure. You can use over the counter Azo or pyridium  to help with burning and follow the instructions on the packaging. If it does not improve within 1-2 days, or other symptoms appear (fever, chills, or difficulty urinating) call the office to speak to a nurse.  You may return to normal daily activities such as work, school, driving, exercising and housework on the day of the procedure. If your doctor gave you a prescription, take it as ordered.   Please call [phone] to schedule the earliest appointment for pelvic floor PT.  If you are not taking allergy medications, try titration of hydroxyzine  25mg  at bedtime. Take earlier in the evening at dinner time if you experience daytime sedation. Can increase to 2 times a day.

## 2024-05-17 ENCOUNTER — Other Ambulatory Visit: Payer: Self-pay | Admitting: Nurse Practitioner

## 2024-05-17 DIAGNOSIS — F411 Generalized anxiety disorder: Secondary | ICD-10-CM

## 2024-05-26 ENCOUNTER — Telehealth: Payer: Self-pay | Admitting: Obstetrics

## 2024-05-26 NOTE — Telephone Encounter (Signed)
 [facility] called this morning saying RX sent in for Vistaril  interacts with RX for Lexapro .  Would like a call back about this.   Ref# [id number]

## 2024-05-28 NOTE — Telephone Encounter (Signed)
 Rx has been shipped already. Rx has been discontinued with [facility]. Left a detailed message for Maria [name] to not take this medication.

## 2024-05-29 ENCOUNTER — Other Ambulatory Visit: Payer: Self-pay | Admitting: Nurse Practitioner

## 2024-05-29 DIAGNOSIS — F411 Generalized anxiety disorder: Secondary | ICD-10-CM

## 2024-06-02 ENCOUNTER — Ambulatory Visit: Admitting: Dermatology

## 2024-06-02 ENCOUNTER — Encounter: Payer: Self-pay | Admitting: Nurse Practitioner

## 2024-06-02 NOTE — Telephone Encounter (Signed)
 Requested Prescriptions  Refused Prescriptions Disp Refills   escitalopram  (LEXAPRO ) 20 MG tablet [Pharmacy Med Name: ESCITALOPRAM  20 MG TABLET] 90 tablet 1    Sig: TAKE 1 TABLET BY MOUTH EVERY DAY     Psychiatry:  Antidepressants - SSRI Passed - 06/02/2024  1:37 PM      Passed - Valid encounter within last 6 months    Recent Outpatient Visits           2 months ago Dysuria   [facility] Gareth Mliss FALCON, FNP   3 months ago GAD (generalized anxiety disorder)   [facility] Gareth Mliss FALCON, FNP   6 months ago GAD (generalized anxiety disorder)   [facility] Gareth Mliss FALCON, FNP   7 months ago GAD (generalized anxiety disorder)   [facility] Gareth Mliss FALCON, FNP       Future Appointments             Today Hester Alm BROCKS, MD [facility]   In 2 weeks Guadlupe Lianne DASEN, MD [facility]rogynecology at [facility], [facility]

## 2024-06-19 ENCOUNTER — Ambulatory Visit: Admitting: Obstetrics

## 2024-06-19 ENCOUNTER — Encounter: Payer: Self-pay | Admitting: Obstetrics

## 2024-06-19 VITALS — BP 126/87 | HR 89

## 2024-06-19 DIAGNOSIS — N3010 Interstitial cystitis (chronic) without hematuria: Secondary | ICD-10-CM | POA: Diagnosis not present

## 2024-06-19 DIAGNOSIS — K5909 Other constipation: Secondary | ICD-10-CM | POA: Diagnosis not present

## 2024-06-19 DIAGNOSIS — R102 Pelvic and perineal pain unspecified side: Secondary | ICD-10-CM | POA: Diagnosis not present

## 2024-06-19 NOTE — Assessment & Plan Note (Signed)
-   continue Linzess  PRN and encouraged to resume titration of Miralax for stool consistency - For constipation, we reviewed the importance of a better bowel regimen.  We also discussed the importance of avoiding chronic straining, as it can exacerbate her pelvic floor symptoms; we discussed treating constipation and straining prior to surgery, as postoperative straining can lead to damage to the repair and recurrence of symptoms. We discussed initiating therapy with increasing fluid intake, fiber supplementation, stool softeners, and laxatives such as miralax.  - encouraged squatting position for bowel movement and avoid straining - referral re-sent to pelvic floor PT, encouraged pt to call for appointment due to high tone pelvic floor on prior exammmm

## 2024-06-19 NOTE — Assessment & Plan Note (Signed)
-   evaluated by Dr. Francisca in 2021, lost to follow-up. 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  - reviewed prior exam finding of high tone pelvic floor and myofascial pain, prior treatments include: Valium  suppositories, flomax, amitriptyline, gabapentin, oxycodone , meloxicam , and referred to pelvic floor PT - reviewed anatomy and discussed association with sensation of incomplete bladder emptying, dyspareunia and constipation - The origin of pelvic floor muscle spasm can be multifactorial, including primary, reactive to a different pain source, trauma, or even part of a centralized pain syndrome.Treatment options include pelvic floor physical therapy, local (vaginal) or oral  muscle relaxants, pelvic muscle trigger point injections or centrally acting pain medications.   - encouraged to schedule pelvic floor PT due to prior improvement in 2021, referral resent  - encouraged optimization of bowel consistency, pelvic floor relaxation with improved bladder emptying in squatting position - encouraged stress management

## 2024-06-19 NOTE — Assessment & Plan Note (Signed)
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  We also discussed using pyridium  and similar over-the-counter medications for pain relief as needed. We discussed the pentad of medications including Tums, an antihistamine such as Vistaril , amitriptyline, and L-arginine.  We also discussed in-office bladder instillations for pain flares, as well as cystoscopy with hydrodistention in the operating room, which can be both diagnostic and therapeutic.  - cystoscopy WNL 05/15/24  - discussed stress management, baths, and encouraged pelvic floor relaxation and yoga - continue limiting caffeine and bladder irritants  - Rx cimetidine  due to difficulty sleeping, not covered by insurance per pt. Rx hydroxyzine  at bedtime with improved sleep, discontinue zyrtec if scalp symptoms improve along with bladder and urethral discomfort. Dislikes brain fog with bedtime dosing. Did not try dosing earlier in the evening - encouraged to follow-up with PCP regarding resumption of Lexapro  and consider psych referral - negative prior testing for UTI  - encouraged pt to schedule PT with information provided, referral resent - continue topical lidocaine  PRN due to prior relief

## 2024-06-19 NOTE — Progress Notes (Signed)
 [facility] Return Visit  SUBJECTIVE  History of Present Illness: Maria [name] is a 33 y.o. female seen in follow-up for interstitial cystitis, urinary frequency, high tone pelvic floor, vaginal burning and dysuria. Plan at last visit was minimize bladder irritations, hydroxyzine , continue Linzess , referral for pelvic floor PT.   Reports doing well with bladder symptoms, however most bothered by anxiety which worsens IBS Stopped lexapro  due to discontinuation of Rx, desires to restart and start ADHD Hydroxyzine  at bedtime with improved sleep, however discontinued due to brain fog. Cimetidine  not covered by pharmacy, changed to hydroxyzine .   Prior bladder instillations without relief. Pelvic floor PT referral sent in 2024 Continues Linzess  PRN for constipation Negative urine culture 03/06/24 with negative UA despite symptoms.  Negative urine culture and nuswab 05/14/24  Prior visit note: Report chronic history of lower urinary tract symptoms at urgent care evaluation, prior antibiotic use without relief.  07/12/23 with UA + protein only, no organisms detected on pathnostics, negative Nuswab. Burning located at urethra and bladder, alleviated by ice pack and EMLA  cream. Reports PT for L4-5 posterior lumbar fusion around 4 years ago, history of pelvic floor PT in [place] in 2021 due to constipation, bladder pain, back and hip pain with ADLs. Stopped Linzess  after pelvic floor PT.  Prior [facility] urology evaluation by Dr. Francisca in 2021. Tried Valium  suppositories, flomax, amitriptyline, gabapentin, oxycodone , meloxicam , and referred to pelvic floor PT in the past.  Reports bladder symptoms returned around 1 year ago with increased stress due to son's seizures and anxiety with panic attacks. Reports doing better since starting anxiety medications. OSA without CPAP use Day time voids >10.  Nocturia: 3-5 x/night Drinks >72oz of fluid at night, drinks throughout the  night, 16oz decaf coffee/day, intermittent soda intake  Uses Linzess  Urethral pain on exam, underwent bladder instillations without relief. Using Zyrtec, buspar , lexapro , EMLA  cream with relief Desires surgical intervention due to mother with diagnosis of IC and underwent bladder scrapping procedure with relief.  Past Medical History: Patient  has a past medical history of Acute pancreatitis, Asthma, GERD (gastroesophageal reflux disease), Hemorrhoid, varicella, Ovarian cyst, Recurrent boils, Thrombocytosis (06/09/2016), and UTI (urinary tract infection).   Past Surgical History: She  has a past surgical history that includes Tonsillectomy; Wisdom tooth extraction; Cesarean section (N/A, 05/25/2016); Flexible sigmoidoscopy (N/A, 11/30/2021); and Back surgery.   Medications: She has a current medication list which includes the following prescription(s): albuterol , cetirizine, clindamycin , cyclobenzaprine, esomeprazole , famotidine , fluconazole , hydrocortisone , ketoconazole , lidocaine -prilocaine , linaclotide , montelukast , and ondansetron .   Allergies: Patient is allergic to bactrim  ds [sulfamethoxazole -trimethoprim ], cipro [ciprofloxacin hcl], amoxicillin, and penicillins.   Social History: Patient  reports that she quit smoking about 10 years ago. Her smoking use included cigarettes. She has quit using smokeless tobacco. She reports current alcohol use of about 1.0 standard drink of alcohol per week. She reports that she does not use drugs.     OBJECTIVE     Physical Exam: Vitals:   06/19/24 1502  BP: 126/87  Pulse: 89   Gen: No apparent distress, A&O x 3.  Detailed Urogynecologic Evaluation:  Deferred.        ASSESSMENT AND PLAN    Maria [name] is a 33 y.o. with:  1. IC (interstitial cystitis)   2. Pelvic pain in female   3. Chronic constipation      IC (interstitial cystitis) Assessment & Plan: - doing well without bladder symptoms - worsened with stressors regarding  son's seizures, encouraged to consider therapy in addition  to resumption of Lexapro  - h/o migraines, IBS-C on Linzess  PRN - no relief with bladder instillation - reports that her mother underwent bladder scrapping to remove bacteria with improvement of symptoms - prior treatments include:  Valium  suppositories, flomax, amitriptyline, gabapentin, oxycodone , meloxicam , and referred to pelvic floor PT in 2021 - For irritative bladder we reviewed treatment options including altering her diet to avoid irritative beverages and foods as well as attempting to decrease stress and other exacerbating factors.  We also discussed using pyridium  and similar over-the-counter medications for pain relief as needed. We discussed the pentad of medications including Tums, an antihistamine such as Vistaril , amitriptyline, and L-arginine.  We also discussed in-office bladder instillations for pain flares, as well as cystoscopy with hydrodistention in the operating room, which can be both diagnostic and therapeutic.  - cystoscopy WNL 05/15/24  - discussed stress management, baths, and encouraged pelvic floor relaxation and yoga - continue limiting caffeine and bladder irritants  - Rx cimetidine  due to difficulty sleeping, not covered by insurance per pt. Rx hydroxyzine  at bedtime with improved sleep, discontinue zyrtec if scalp symptoms improve along with bladder and urethral discomfort. Dislikes brain fog with bedtime dosing. Did not try dosing earlier in the evening - encouraged to follow-up with PCP regarding resumption of Lexapro  and consider psych referral - negative prior testing for UTI  - encouraged pt to schedule PT with information provided, referral resent - continue topical lidocaine  PRN due to prior relief  Orders: -     AMB referral to rehabilitation  Pelvic pain in female Assessment & Plan: - evaluated by Dr. Francisca in 2021, lost to follow-up. Reports dyspareunia since high school, worsened after L4-5  posterior lumbar fusion  - reviewed prior exam finding of high tone pelvic floor and myofascial pain, prior treatments include: Valium  suppositories, flomax, amitriptyline, gabapentin, oxycodone , meloxicam , and referred to pelvic floor PT - reviewed anatomy and discussed association with sensation of incomplete bladder emptying, dyspareunia and constipation - The origin of pelvic floor muscle spasm can be multifactorial, including primary, reactive to a different pain source, trauma, or even part of a centralized pain syndrome.Treatment options include pelvic floor physical therapy, local (vaginal) or oral  muscle relaxants, pelvic muscle trigger point injections or centrally acting pain medications.   - encouraged to schedule pelvic floor PT due to prior improvement in 2021, referral resent  - encouraged optimization of bowel consistency, pelvic floor relaxation with improved bladder emptying in squatting position - encouraged stress management   Orders: -     AMB referral to rehabilitation  Chronic constipation Assessment & Plan: - continue Linzess  PRN and encouraged to resume titration of Miralax for stool consistency - For constipation, we reviewed the importance of a better bowel regimen.  We also discussed the importance of avoiding chronic straining, as it can exacerbate her pelvic floor symptoms; we discussed treating constipation and straining prior to surgery, as postoperative straining can lead to damage to the repair and recurrence of symptoms. We discussed initiating therapy with increasing fluid intake, fiber supplementation, stool softeners, and laxatives such as miralax.  - encouraged squatting position for bowel movement and avoid straining - referral re-sent to pelvic floor PT, encouraged pt to call for appointment due to high tone pelvic floor on prior exammmm    Time spent: I spent 52 minutes dedicated to the care of this patient on the date of this encounter to include  pre-visit review of records, face-to-face time with the patient discussing interstitial cystitis, pelvic  pain, constipation, nocturia and post visit documentation and ordering medication/ testing.    Lianne ONEIDA Gillis, MD

## 2024-06-19 NOTE — Patient Instructions (Addendum)
 Please call [phone] to schedule the earliest appointment for pelvic floor PT.  Continue probiotics  Please return if you experience change in urinary or vaginal symptoms.   Take Azo as needed for bladder discomfort.   Consider Start IBGuard for IBS symptoms.   Consider slow titration of miralax.   Follow-up with your primary care provider and consider to establish care with gastroenterology.

## 2024-06-20 ENCOUNTER — Encounter: Payer: Self-pay | Admitting: Nurse Practitioner

## 2024-06-20 ENCOUNTER — Ambulatory Visit: Admitting: Nurse Practitioner

## 2024-06-20 DIAGNOSIS — Z23 Encounter for immunization: Secondary | ICD-10-CM

## 2024-08-07 ENCOUNTER — Encounter: Payer: Self-pay | Admitting: Obstetrics

## 2024-08-07 ENCOUNTER — Other Ambulatory Visit: Payer: Self-pay | Admitting: Nurse Practitioner

## 2024-08-07 DIAGNOSIS — R110 Nausea: Secondary | ICD-10-CM

## 2024-08-07 NOTE — Telephone Encounter (Signed)
 Patient scheduled.

## 2024-08-08 ENCOUNTER — Ambulatory Visit

## 2024-08-08 DIAGNOSIS — R350 Frequency of micturition: Secondary | ICD-10-CM

## 2024-08-08 LAB — POCT URINALYSIS DIP (CLINITEK)
Bilirubin, UA: NEGATIVE
Blood, UA: NEGATIVE
Glucose, UA: NEGATIVE mg/dL
Ketones, POC UA: NEGATIVE mg/dL
Leukocytes, UA: NEGATIVE
Nitrite, UA: NEGATIVE
POC PROTEIN,UA: NEGATIVE
Spec Grav, UA: 1.02
Urobilinogen, UA: 0.2 U/dL
pH, UA: 6.5

## 2024-08-08 NOTE — Progress Notes (Signed)
 Maria [name] arrived today with dysuria, lower abdominal pain, and urinary frequency. Patient is notexperiencing fever, unstable vitals and/or one-sided back flank pain. Patient has not had had a recent hospitalization due to UTI.  Last visit in the office was 06/19/2024.  Per protocol:   The most recent Urinalysis completed on 05/14/2024 and was notnormal.  Last Creatinine level  Lab Results  Component Value Date   CREATININE 0.80 02/27/2024    An urine specimen was collected and POCT urinalysis completed. [] A cath specimen was collected due to patient's current condition, symptoms or post-procedural state.  Total urine output by catheter is  Output by Drain (mL) 08/06/24 0701 - 08/06/24 1900 08/06/24 1901 - 08/07/24 0700 08/07/24 0701 - 08/07/24 1900 08/07/24 1901 - 08/08/24 0700 08/08/24 0701 - 08/08/24 1315  Patient has no LDAs of requested type attached.    SABRA    POCT Urine results is normal.  Urine micro was not sent per protocol for abnormal urinalysis.  Urine culture was not sent per protocol for abnormal urinalysis.     [] Pt was notified of positive urine results and plan for additional urine testing. We will contact you within the next 3-4 days with these results.  [] No Prescription was sent to your pharmacy.  The additional testing will indicate if a prescription is needed.   [] Patient was notified of abnormal urine results. The following prescription is sent to your preferred pharmacy.  []  Macrobid 100mg  #10 1 tablet by mouth twice daily with food for 5 days      []  Bactrim  DS 800-160mg  #6 1 tablet by mouth twice daily for 3 days        []  Due to your current medication allergies, an alternate prescription was discussed with your provider and will be prescribed and sent to your pharmacy.  [] You can take over the counter AZO two tablets up to three times a day for two days.  Take AZO tablets with a full glass of water. AZO will turn your urine orange, this is normal.   [x] The  patient was notified of negative urine results.  If symptoms persist, you may take over the counter AZO two tablets up to three times a day for two days.  AZO will turn your urine orange, this is normal.  Contact the office back to schedule an appointment if your symptoms persist or worsen or you develop additional symptoms.       CC'd note to patient's provider.

## 2024-08-08 NOTE — Patient Instructions (Signed)
Your Urine dip that was done in office was Negative. I am sending the urine off for culture and you can take AZO over the counter for your discomfort.  We will contact you when the results are back between 3-5 days. If a  antibiotic is needed we will sent the order to the pharmacy and you will be notified. If you have any questions or concerns please feel free to call us at [phone]

## 2024-08-09 NOTE — Telephone Encounter (Signed)
 Requested medication (s) are due for refill today: Yes  Requested medication (s) are on the active medication list: Yes  Last refill:  10/03/23  Future visit scheduled: No  Notes to clinic:  Unable to refill per protocol, cannot delegate.      Requested Prescriptions  Pending Prescriptions Disp Refills   ondansetron  (ZOFRAN ) 4 MG tablet [Pharmacy Med Name: ONDANSETRON  HCL 4 MG TABLET] 30 tablet 0    Sig: TAKE 1 TABLET BY MOUTH EVERY 8 HOURS AS NEEDED FOR NAUSEA AND VOMITING     Not Delegated - Gastroenterology: Antiemetics - ondansetron  Failed - 08/09/2024  9:06 AM      Failed - This refill cannot be delegated      Failed - ALT in normal range and within 360 days    ALT  Date Value Ref Range Status  02/27/2024 49 (H) 0 - 44 U/L Final   SGPT (ALT)  Date Value Ref Range Status  10/08/2013 20 12 - 78 U/L Final         Passed - AST in normal range and within 360 days    AST  Date Value Ref Range Status  02/27/2024 39 15 - 41 U/L Final   SGOT(AST)  Date Value Ref Range Status  10/08/2013 22 15 - 37 Unit/L Final         Passed - Valid encounter within last 6 months    Recent Outpatient Visits           5 months ago Dysuria   [facility] Gareth Mliss FALCON, FNP   5 months ago GAD (generalized anxiety disorder)   [facility] Gareth Mliss FALCON, FNP   9 months ago GAD (generalized anxiety disorder)   [facility] Gareth Mliss FALCON, FNP   10 months ago GAD (generalized anxiety disorder)   [facility] Gareth Mliss FALCON, FNP       Future Appointments             In 1 month Hester Alm BROCKS, MD [facility]

## 2024-09-01 ENCOUNTER — Encounter: Payer: Self-pay | Admitting: *Deleted

## 2024-09-08 ENCOUNTER — Encounter: Payer: Self-pay | Admitting: Psychiatry

## 2024-09-08 ENCOUNTER — Ambulatory Visit: Admitting: Psychiatry

## 2024-09-08 ENCOUNTER — Other Ambulatory Visit
Admission: RE | Admit: 2024-09-08 | Discharge: 2024-09-08 | Disposition: A | Discharge status: Home / Self Care | Source: Ambulatory Visit | Attending: Psychiatry | Admitting: Psychiatry

## 2024-09-08 VITALS — BP 114/82 | HR 91 | Temp 97.8°F | Ht 63.5 in | Wt 177.0 lb

## 2024-09-08 DIAGNOSIS — R41840 Attention and concentration deficit: Secondary | ICD-10-CM | POA: Insufficient documentation

## 2024-09-08 DIAGNOSIS — Z79899 Other long term (current) drug therapy: Secondary | ICD-10-CM

## 2024-09-08 DIAGNOSIS — F411 Generalized anxiety disorder: Secondary | ICD-10-CM
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
## 2024-09-09 LAB — URINE DRUGS OF ABUSE SCREEN W ALC, ROUTINE (REF LAB)
Amphetamines, Urine: NEGATIVE ng/mL
Barbiturate, Ur: NEGATIVE ng/mL
Benzodiazepine Quant, Ur: NEGATIVE ng/mL
Cannabinoid Quant, Ur: NEGATIVE ng/mL
Cocaine (Metab.): NEGATIVE ng/mL
Creatinine, Urine: 52.1 mg/dL (ref 20.0–300.0)
Ethanol U, Quan: NEGATIVE %
Methadone Screen, Urine: NEGATIVE ng/mL
Nitrite Urine, Quantitative: NEGATIVE ug/mL
OPIATE SCREEN URINE: NEGATIVE ng/mL
Phencyclidine, Ur: NEGATIVE ng/mL
Propoxyphene, Urine: NEGATIVE ng/mL
pH, Urine: 6.1 (ref 4.5–8.9)

## 2024-09-11 ENCOUNTER — Ambulatory Visit: Payer: Self-pay | Admitting: Psychiatry

## 2024-09-17 NOTE — Therapy (Unsigned)
 " OUTPATIENT PHYSICAL THERAPY FEMALE PELVIC EVALUATION   Patient Name: Maria [name] MRN: [id number] DOB:08-26-1990, 34 y.o., female Today's Date: 09/18/2024  END OF SESSION:  PT End of Session - 09/18/24 1140     Visit Number 1    Date for Recertification  03/18/25    Authorization Type [employer]    PT Start Time 1130    PT Stop Time 1215    PT Time Calculation (min) 45 min    Activity Tolerance Patient tolerated treatment well    Behavior During Therapy WFL for tasks assessed/performed          Past Medical History:  Diagnosis Date   Acute pancreatitis    Asthma    GERD (gastroesophageal reflux disease)    Hemorrhoid    Hx of varicella    Ovarian cyst    Recurrent boils    legs and buttocks.  staff infection, was neg   Thrombocytosis 06/09/2016   UTI (urinary tract infection)    Past Surgical History:  Procedure Laterality Date   BACK SURGERY     CESAREAN SECTION N/A 05/25/2016   Procedure: CESAREAN SECTION;  Surgeon: Rexene Hoit, MD;  Location: [facility] SUIT[facility];  Service: Obstetrics;  Laterality: N/A;   FLEXIBLE SIGMOIDOSCOPY N/A 11/30/2021   Procedure: FLEXIBLE SIGMOIDOSCOPY;  Surgeon: Unk Corinn Skiff, MD;  Location: [facility] ENDOSCOPY;  Service: Gastroenterology;  Laterality: N/A;   TONSILLECTOMY     WISDOM TOOTH EXTRACTION     Patient Active Problem List   Diagnosis Date Noted   Attention and concentration deficit 09/08/2024   Irregular heartbeat 12/30/2023   Nocturia 12/17/2023   IC (interstitial cystitis) 12/17/2023   Palpitations 10/11/2023   Chronic constipation 01/18/2023   Migraine without aura and without status migrainosus, not intractable 01/18/2023   History of ADHD 01/18/2023   GAD (generalized anxiety disorder) 01/18/2023   Environmental allergies 01/18/2023   Atopic neurodermatitis 01/18/2023   Sleeping difficulty 12/07/2020   Stress 12/07/2020   Spinal stenosis of lumbar region 11/12/2019   Pelvic pain in female 11/12/2019    Gastroesophageal reflux disease without esophagitis 11/12/2019   Pre-bariatric surgery nutrition evaluation 08/11/2019   Spondylolysis 10/30/2018   Chronic back pain 10/30/2018   S/P primary low transverse C-section 05/25/2016    PCP: Gareth Mliss FALCON, FNP  REFERRING PROVIDER: Guadlupe Lianne DASEN, MD   REFERRING DIAG:  N30.10 (ICD-10-CM) - IC (interstitial cystitis)  R10.20 (ICD-10-CM) - Pelvic pain in female  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  Fluid intake:    PERTINENT HISTORY:  Medications for current condition: none Surgeries: back surgery; cesarean section;  Other: Hemorrhoid; ovarian cyst  PAIN:  Are you having pain? Yes NPRS scale: 7/10 Pain location: right abdominal pain  Pain type: sharp Pain description: intermittent   Aggravating factors: pushing on the abdomen; when bladder gets full Relieving factors: heating pad  PRECAUTIONS: None  RED FLAGS: None   WEIGHT BEARING RESTRICTIONS: No  FALLS:  Has patient fallen in last 6 months? 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   ACTIVITY LEVEL : not a routine program  PLOF: Independent  PATIENT GOALS: help the bladder   BOWEL MOVEMENT: Pain with bowel movement: No Type of bowel movement:Type (Bristol Stool Scale) Type 2-3 or type 5-6, Frequency 2-3 days, Strain yes, and Splinting no Fully  empty rectum: No Leakage: No                                                  Bowel urgency: no Fiber supplement/laxative Miralax, fiber gummies  URINATION: Pain with urination: Yes; pain level 5/10 Fully empty bladder: No uses a stool, rub the sacrum                                         Post-void dribble: No Stream: Weak Urgency: Yes , sometimes Frequency:during the day every 30- 60 minutes                                                        Nocturia: No1-2   Leakage: Sneezing and Laughing Pads/briefs: No  INTERCOURSE:  Ability to have vaginal penetration Yes  Pain with intercourse: when she is on top erect, partner is behind Dryness: No Climax: yes Marinoff Scale: 1/3 Lubricant:none  PREGNANCY: C-section deliveries 1  PROLAPSE: Pressure   OBJECTIVE:  Note: Objective measures were completed at Evaluation unless otherwise noted.   PATIENT SURVEYS:  PFIQ-7: 79 UIQ-7 8 POPIQ-7 38  COGNITION: Overall cognitive status: Within functional limits for tasks assessed     SENSATION: Light touch: Appears intact    POSTURE: decreased lumbar lordosis   LUMBARAROM/PROM:  A/PROM A/PROM  Eval (% available)  Flexion 100  Extension 75  Right lateral flexion 75  Left lateral flexion 75  Right rotation 75  Left rotation 75   (Blank rows = not tested)  LOWER EXTREMITY ROM: bilateral hip ROM is WFL   LOWER EXTREMITY MMT:  MMT Right eval Left eval  Hip extension 4/5 4/5  Hip abduction 3/5 3/5   (Blank rows = not tested) PALPATION:   Pelvic Alignment: ASIS are equal  Abdominal: tenderness located in the lower abdomen  Diastasis: No Distortion: No  Breathing: decreased movement of the lower abdomen Scar tissue: Yes: c-section scar has minimal restrictions                External Perineal Exam: tenderness located on the levator ani  Internal Pelvic Floor: tenderness located in the levator ani, bilateral sides of the  bladder  Patient confirms identification and approves PT to assess internal pelvic floor and treatment Yes All internal or external pelvic floor assessments and/or treatments are completed with proper hand hygiene and gloves hands. If needed gloves are changed with hand hygiene during patient care time. No emotional/communication barriers or cognitive limitation. Patient is motivated to learn. Patient understands and agrees with treatment goals and plan. PT explains patient will be examined in standing, sitting, and lying down to see how their muscles and joints work. When they are ready, they will be asked to remove their underwear so PT can examine their perineum. The patient is also given the option of providing their own chaperone as one is not provided in our facility. The patient also has the right and is explained the right to defer or refuse any part of the evaluation or treatment including the internal exam. With the patient's consent, PT will use one gloved finger to gently assess the muscles of the pelvic floor, seeing how well it contracts and relaxes and if there is muscle symmetry. After, the patient will get dressed and PT and patient will discuss exam findings and plan of care. PT and patient discuss plan of care, schedule, attendance policy and HEP activities.   PELVIC MMT:   MMT eval  Vaginal 3/5 but difficulty with relaxing  Internal Anal Sphincter   External Anal Sphincter   Puborectalis   (Blank rows = not tested)        TONE: Increased tone and difficulty with relaxation    TODAY'S TREATMENT:                                                                                                                              DATE: 09/18/24  EVAL Examination completed, findings reviewed, pt educated on POC, HEP, and female pelvic floor anatomy, reasoning with pelvic floor assessment internally with pt consent. Pt motivated to participate in PT and agreeable to attempt recommendations.      PATIENT EDUCATION:  Education details: see above Person educated: Patient Education method: Explanation, Demonstration, Tactile cues, Verbal cues, and Handouts Education comprehension: verbalized understanding, returned demonstration, verbal cues required, tactile cues required, and needs further education  HOME EXERCISE PROGRAM: See above.   ASSESSMENT:  CLINICAL IMPRESSION: Patient is a 34 y.o. Female who was seen today for physical therapy evaluation and treatment for pelvic pain and high tone. Patient had a traumatic birth experience with  her pushing for 4 hours then having to have a c-section. Her c-section scar has healed well with slight restrictions. She has tenderness located in the lower abdomen. Abdominal pain is 7/10 with sudden onset. She has 4/10 pain with urination. She will leak urine with laughing and sneezing. She has difficulty with fully emptying her bladder and feels she has to urinate every 30 to 60 minutes. Pelvic floor strength is 3/5 with  difficulty relaxing. She has tenderness located on bilateral levator ani and sides of bladder. Patient will benefit from skilled therapy to relax her pelvic floor and improve coordination of the muscles.   OBJECTIVE IMPAIRMENTS: decreased coordination, decreased strength, increased fascial restrictions, increased muscle spasms, and pain.   ACTIVITY LIMITATIONS: continence and toileting  PARTICIPATION LIMITATIONS: interpersonal relationship, driving, shopping, and community activity  PERSONAL FACTORS: 1-2 comorbidities: back surgery; cesarean section are also affecting patient's functional outcome.   REHAB POTENTIAL: Excellent  CLINICAL DECISION MAKING: Evolving/moderate complexity  EVALUATION COMPLEXITY: Moderate   GOALS: Goals reviewed with patient? Yes  SHORT TERM GOALS: Target date: 10/16/24  Patient independent with initial HEP.  Baseline:not educated yet Goal status: INITIAL  2.  Patient is able to perform  diaphragmatic breathing to elongate her pelvic floor.  Baseline: not educated yet.  Goal status: INITIAL  3.  Patient educated on the urge to void to reduce urgency.  Baseline: not educated yet Goal status: INITIAL   LONG TERM GOALS: Target date: 03/18/25  Patient independent with advanced HEP for hip, core and pelvic floor to reduce urgency, pain, and leakage.  Baseline: not educated yet Goal status: INITIAL  2.  Patient is able to urinate every 2-3 hours due to using the urge to void technique and relaxation of the pelvic floor muscles.  Baseline: urinate every 30-60 minutes Goal status: INITIAL  3.  Patient is able to fully empty her bladder with relaxation of the pelvic floor and correct toileting technique.  Baseline: has to return to urinate in 15 minutes Goal status: INITIAL  4.  Patient is able to urinate without pain due to reduction of trigger points in the pelvic floor.  Baseline: pain level is 4/10 Goal status: INITIAL  5.  Patients abdominal pain with daily tasks decreased </= 3/10 due to reduction of trigger points.  Baseline: pain level is 7/10 Goal status: INITIAL   PLAN:  PT FREQUENCY: 1-2x/week  PT DURATION: 6 months  PLANNED INTERVENTIONS: 97110-Therapeutic exercises, 97530- Therapeutic activity, 97112- Neuromuscular re-education, 97535- Self Care, 02859- Manual therapy, G0283- Electrical stimulation (unattended), 20560 (1-2 muscles), 20561 (3+ muscles)- Dry Needling, Patient/Family education, Joint mobilization, Cryotherapy, Moist heat, and Biofeedback  PLAN FOR NEXT SESSION: manual work to the pelvic floor and abdomen, diaphragmatic breathing, hip stretches   Channing Pereyra, PT 09/18/24 12:44 PM   "

## 2024-09-18 ENCOUNTER — Other Ambulatory Visit: Payer: Self-pay

## 2024-09-18 ENCOUNTER — Encounter: Payer: Self-pay | Admitting: Physical Therapy

## 2024-09-18 ENCOUNTER — Encounter: Attending: Obstetrics | Admitting: Physical Therapy

## 2024-09-18 DIAGNOSIS — R252 Cramp and spasm: Secondary | ICD-10-CM | POA: Insufficient documentation

## 2024-09-18 DIAGNOSIS — R102 Pelvic and perineal pain unspecified side: Secondary | ICD-10-CM | POA: Insufficient documentation

## 2024-09-18 DIAGNOSIS — R1030 Lower abdominal pain, unspecified: Secondary | ICD-10-CM | POA: Diagnosis present

## 2024-09-25 ENCOUNTER — Encounter: Payer: Self-pay | Admitting: Physical Therapy

## 2024-09-25 ENCOUNTER — Encounter: Admitting: Physical Therapy

## 2024-09-25 DIAGNOSIS — R102 Pelvic and perineal pain unspecified side: Secondary | ICD-10-CM

## 2024-09-25 DIAGNOSIS — R252 Cramp and spasm: Secondary | ICD-10-CM

## 2024-09-25 DIAGNOSIS — R1030 Lower abdominal pain, unspecified: Secondary | ICD-10-CM
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
## 2024-10-01 ENCOUNTER — Telehealth: Admitting: Psychiatry

## 2024-10-02 ENCOUNTER — Encounter: Admitting: Physical Therapy

## 2024-10-03 ENCOUNTER — Telehealth: Admitting: Psychiatry

## 2024-10-06 ENCOUNTER — Ambulatory Visit: Admitting: Dermatology

## 2024-10-09 ENCOUNTER — Encounter: Admitting: Physical Therapy

## 2024-10-20 ENCOUNTER — Ambulatory Visit
